# Patient Record
Sex: Female | Born: 1990
Health system: Southern US, Community
[De-identification: ages and names within clinical notes are randomized; demographics above are authoritative.]

## PROBLEM LIST (undated history)

## (undated) DIAGNOSIS — F319 Bipolar disorder, unspecified: Secondary | ICD-10-CM

## (undated) DIAGNOSIS — D497 Neoplasm of unspecified behavior of endocrine glands and other parts of nervous system: Secondary | ICD-10-CM

## (undated) DIAGNOSIS — E063 Autoimmune thyroiditis: Secondary | ICD-10-CM

## (undated) DIAGNOSIS — E221 Hyperprolactinemia: Secondary | ICD-10-CM

## (undated) DIAGNOSIS — N911 Secondary amenorrhea: Secondary | ICD-10-CM

## (undated) DIAGNOSIS — L680 Hirsutism: Secondary | ICD-10-CM

## (undated) DIAGNOSIS — E039 Hypothyroidism, unspecified: Secondary | ICD-10-CM

## (undated) DIAGNOSIS — N643 Galactorrhea not associated with childbirth: Secondary | ICD-10-CM

## (undated) HISTORY — DX: Hirsutism: L68.0

## (undated) HISTORY — DX: Neoplasm of unspecified behavior of endocrine glands and other parts of nervous system: D49.7

## (undated) HISTORY — DX: Hypothyroidism, unspecified: E03.9

## (undated) HISTORY — DX: Bipolar disorder, unspecified: F31.9

## (undated) HISTORY — DX: Secondary amenorrhea: N91.1

## (undated) HISTORY — DX: Autoimmune thyroiditis: E06.3

## (undated) HISTORY — DX: Hyperprolactinemia: E22.1

## (undated) HISTORY — PX: OTHER SURGICAL HISTORY: SHX169

---
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
## 2012-03-11 ENCOUNTER — Other Ambulatory Visit: Payer: Self-pay | Admitting: *Deleted

## 2012-03-11 DIAGNOSIS — E039 Hypothyroidism, unspecified: Secondary | ICD-10-CM

## 2012-04-20 ENCOUNTER — Ambulatory Visit: Payer: 59 | Admitting: "Endocrinology

## 2012-08-10 LAB — LUTEINIZING HORMONE

## 2012-08-10 LAB — T4, FREE

## 2012-08-10 LAB — TESTOSTERONE, FREE, TOTAL, SHBG

## 2012-08-10 LAB — ESTRADIOL

## 2012-08-10 LAB — T3, FREE

## 2012-12-15 ENCOUNTER — Other Ambulatory Visit: Payer: Self-pay | Admitting: "Endocrinology

## 2013-04-25 ENCOUNTER — Encounter (HOSPITAL_COMMUNITY): Payer: Self-pay | Admitting: *Deleted

## 2013-04-25 ENCOUNTER — Emergency Department (HOSPITAL_COMMUNITY)
Admission: EM | Admit: 2013-04-25 | Discharge: 2013-04-25 | Disposition: A | Discharge status: Home / Self Care | Payer: BC Managed Care – PPO | Attending: Emergency Medicine | Admitting: Emergency Medicine

## 2013-04-25 DIAGNOSIS — F319 Bipolar disorder, unspecified: Secondary | ICD-10-CM | POA: Insufficient documentation

## 2013-04-25 DIAGNOSIS — M549 Dorsalgia, unspecified: Secondary | ICD-10-CM | POA: Insufficient documentation

## 2013-04-25 DIAGNOSIS — E038 Other specified hypothyroidism: Secondary | ICD-10-CM | POA: Insufficient documentation

## 2013-04-25 DIAGNOSIS — R300 Dysuria: Secondary | ICD-10-CM | POA: Insufficient documentation

## 2013-04-25 DIAGNOSIS — Z79899 Other long term (current) drug therapy: Secondary | ICD-10-CM | POA: Insufficient documentation

## 2013-04-25 DIAGNOSIS — N898 Other specified noninflammatory disorders of vagina: Secondary | ICD-10-CM | POA: Insufficient documentation

## 2013-04-25 DIAGNOSIS — R110 Nausea: Secondary | ICD-10-CM | POA: Insufficient documentation

## 2013-04-25 DIAGNOSIS — R42 Dizziness and giddiness: Secondary | ICD-10-CM | POA: Insufficient documentation

## 2013-04-25 DIAGNOSIS — Z872 Personal history of diseases of the skin and subcutaneous tissue: Secondary | ICD-10-CM | POA: Insufficient documentation

## 2013-04-25 DIAGNOSIS — R109 Unspecified abdominal pain: Secondary | ICD-10-CM | POA: Insufficient documentation

## 2013-04-25 DIAGNOSIS — Z3202 Encounter for pregnancy test, result negative: Secondary | ICD-10-CM | POA: Insufficient documentation

## 2013-04-25 DIAGNOSIS — N12 Tubulo-interstitial nephritis, not specified as acute or chronic: Secondary | ICD-10-CM | POA: Insufficient documentation

## 2013-04-25 DIAGNOSIS — E063 Autoimmune thyroiditis: Secondary | ICD-10-CM | POA: Insufficient documentation

## 2013-04-25 DIAGNOSIS — Z880 Allergy status to penicillin: Secondary | ICD-10-CM | POA: Insufficient documentation

## 2013-04-25 DIAGNOSIS — R55 Syncope and collapse: Secondary | ICD-10-CM | POA: Insufficient documentation

## 2013-04-25 DIAGNOSIS — E229 Hyperfunction of pituitary gland, unspecified: Secondary | ICD-10-CM | POA: Insufficient documentation

## 2013-04-25 LAB — URINALYSIS, ROUTINE W REFLEX MICROSCOPIC
Nitrite: POSITIVE — AB
Specific Gravity, Urine: 1.027 (ref 1.005–1.030)
Urobilinogen, UA: 1 mg/dL (ref 0.0–1.0)

## 2013-04-25 LAB — WET PREP, GENITAL: Yeast Wet Prep HPF POC: NONE SEEN

## 2013-04-25 LAB — URINE MICROSCOPIC-ADD ON

## 2013-04-25 LAB — CBC WITH DIFFERENTIAL/PLATELET
Eosinophils Absolute: 0.1 10*3/uL (ref 0.0–0.7)
Eosinophils Relative: 1 % (ref 0–5)
MCH: 20.6 pg — ABNORMAL LOW (ref 26.0–34.0)
MCHC: 30.3 g/dL (ref 30.0–36.0)
Monocytes Absolute: 1.1 10*3/uL — ABNORMAL HIGH (ref 0.1–1.0)
Neutrophils Relative %: 75 % (ref 43–77)
Platelets: 255 10*3/uL (ref 150–400)
RBC: 5.19 MIL/uL — ABNORMAL HIGH (ref 3.87–5.11)

## 2013-04-25 LAB — BASIC METABOLIC PANEL
BUN: 8 mg/dL (ref 6–23)
Chloride: 102 mEq/L (ref 96–112)
Glucose, Bld: 81 mg/dL (ref 70–99)
Potassium: 4.7 mEq/L (ref 3.5–5.1)

## 2013-04-25 LAB — POCT PREGNANCY, URINE: Preg Test, Ur: NEGATIVE

## 2013-04-25 MED ORDER — CEPHALEXIN 500 MG PO CAPS: 500.0000 mg | ORAL_CAPSULE | Freq: Two times a day (BID) | ORAL | Status: DC

## 2013-04-25 MED ORDER — DEXTROSE 5 % IV SOLN
1.0000 g | Freq: Once | INTRAVENOUS | Status: AC
  Administered 2013-04-25: 1 g via INTRAVENOUS
  Filled 2013-04-25: qty 10

## 2013-04-25 MED ORDER — SODIUM CHLORIDE 0.9 % IV BOLUS (SEPSIS)
1000.0000 mL | Freq: Once | INTRAVENOUS | Status: AC
  Administered 2013-04-25: 1000 mL via INTRAVENOUS

## 2013-04-25 MED ORDER — ONDANSETRON 8 MG PO TBDP: 8.0000 mg | ORAL_TABLET | Freq: Three times a day (TID) | ORAL | Status: DC | PRN

## 2013-04-25 NOTE — ED Notes (Signed)
Bed:WA03<BR> Expected date:<BR> Expected time:<BR> Means of arrival:<BR> Comments:<BR>

## 2013-04-25 NOTE — ED Provider Notes (Signed)
PROCEDURE    EMERGENCY DEPARTMENT [place] CARDIAC EXAM "Study: Limited Ultrasound of the heart and pericardium"  INDICATIONS: possible pericarditis, rule out effusion Multiple views of the heart and pericardium are obtained with a multi-frequency probe.  PERFORMED MW:UXLKGM  IMAGES ARCHIVED?: Yes  FINDINGS: No pericardial effusion, Normal contractility and Tamponade physiology absent  LIMITATIONS: none  VIEWS USED: Subcostal 4 chamber, Parasternal long axis, Parasternal short axis and Apical 4 chamber   INTERPRETATION: Cardiac activity present, Pericardial effusioin absent and Normal contractility    Enid Skeens, MD 04/25/13 1920

## 2013-04-25 NOTE — ED Provider Notes (Addendum)
CSN: [id number]     Arrival date & time 04/25/13  1429 History     First MD Initiated Contact with Patient 04/25/13 1521     Chief Complaint  Patient presents with  . urinating blood    . Near Syncope   (Consider location/radiation/quality/duration/timing/severity/associated sxs/prior Treatment) HPI Comments: 22 y/o female with no significant medical hx, OB hx, Gyne hx, comes in with cc of vaginal bleeding and syncope. Pt has been having some dysuria x few days, and now having some back pain, lower abd pain and nausea. Pt has no fevers. Pt started having hematuria 2 days ago. Pt states that she passed out while waiting for her train. She had some nausea, dizziness prior to passing out, but no other prodromal sx. Has passed out once before when she was 18. No cardiac hx for patient, or her family, no premature CAD in the family. Denies drug use.  The history is provided by the patient.    Past Medical History  Diagnosis Date  . Hypothyroidism   . Hypothyroidism, acquired, autoimmune   . Thyroiditis, autoimmune   . Galactorrhea   . Hyperprolactinemia   . Pituitary tumor   . Amenorrhea, secondary   . Hirsutism   . Bipolar disorder    Past Surgical History  Procedure Laterality Date  . None     Family History  Problem Relation Age of Onset  . Diabetes Paternal Grandfather   . Obesity Mother   . Hypertension Father   . Anemia Father   . Obesity Paternal Aunt   . Cancer Maternal Grandfather   . Diabetes Maternal Grandfather   . Thyroid disease Neg Hx   . Kidney disease Neg Hx   . Bipolar disorder Neg Hx    History  Substance Use Topics  . Smoking status: Passive Smoke Exposure - Never Smoker  . Smokeless tobacco: Never Used  . Alcohol Use: No   OB History   Grav Para Term Preterm Abortions TAB SAB Ect Mult Living                 Review of Systems  Constitutional: Negative for activity change.  HENT: Negative for facial swelling and neck pain.   Respiratory:  Negative for cough, shortness of breath and wheezing.   Cardiovascular: Negative for chest pain.  Gastrointestinal: Positive for nausea and abdominal pain. Negative for vomiting, diarrhea, constipation, blood in stool and abdominal distention.  Genitourinary: Positive for hematuria, flank pain, vaginal bleeding and vaginal discharge. Negative for difficulty urinating.  Skin: Negative for color change.  Neurological: Positive for syncope. Negative for speech difficulty.  Hematological: Does not bruise/bleed easily.  Psychiatric/Behavioral: Negative for confusion.    Allergies  Penicillins  Home Medications   Current Outpatient Rx  Name  Route  Sig  Dispense  Refill  . desogestrel-ethinyl estradiol (APRI,EMOQUETTE,SOLIA) 0.15-30 MG-MCG tablet   Oral   Take 1 tablet by mouth daily.         Marland Kitchen levothyroxine (SYNTHROID, LEVOTHROID) 50 MCG tablet   Oral   Take 50 mcg by mouth daily before breakfast. Brand Name Only         . lithium carbonate 300 MG capsule   Oral   Take 900 mg by mouth at bedtime.         Marland Kitchen lurasidone (LATUDA) 80 MG TABS tablet   Oral   Take 40 mg by mouth daily with breakfast.         . traZODone (DESYREL) 100 MG tablet  Oral   Take 100 mg by mouth at bedtime.          BP 106/70  Pulse 99  Temp(Src) 98.9 F (37.2 C) (Oral)  Resp 16  SpO2 99% Physical Exam  Nursing note and vitals reviewed. Constitutional: She is oriented to person, place, and time. She appears well-developed and well-nourished.  HENT:  Head: Normocephalic and atraumatic.  Eyes: Conjunctivae and EOM are normal. Pupils are equal, round, and reactive to light.  Neck: Normal range of motion. Neck supple.  Cardiovascular: Normal rate, regular rhythm, normal heart sounds and intact distal pulses.   No murmur heard. Pulmonary/Chest: Effort normal. No respiratory distress. She has no wheezes.  Abdominal: Soft. Bowel sounds are normal. She exhibits no distension. There is no  tenderness. There is no rebound and no guarding.  Genitourinary: Vagina normal and uterus normal.  External exam - normal, no lesions Speculum exam: Pt has some white discharge, foul smelling, no blood Bimanual exam: Patient has no CMT, no adnexal tenderness or fullness and cervical os is closed  Neurological: She is alert and oriented to person, place, and time.  Skin: Skin is warm and dry.    ED Course   Procedures (including critical care time)  Labs Reviewed  URINALYSIS, ROUTINE W REFLEX MICROSCOPIC - Abnormal; Notable for the following:    Color, Urine ORANGE (*)    APPearance TURBID (*)    Hgb urine dipstick SMALL (*)    Bilirubin Urine SMALL (*)    Ketones, ur 15 (*)    Protein, ur 100 (*)    Nitrite POSITIVE (*)    Leukocytes, UA LARGE (*)    All other components within normal limits  URINE MICROSCOPIC-ADD ON - Abnormal; Notable for the following:    Squamous Epithelial / LPF FEW (*)    Bacteria, UA MANY (*)    All other components within normal limits  URINE CULTURE  WET PREP, GENITAL  GC/CHLAMYDIA PROBE AMP  CBC WITH DIFFERENTIAL  BASIC METABOLIC PANEL  LITHIUM LEVEL  POCT PREGNANCY, URINE   No results found. No diagnosis found.  MDM  DDx includes:  Orthostatic hypotension Dysrhythmia  PE Vasovagal/neurocardiogenic syncope Anemia GC/Chlamydia Trichomonas PID UTI/cystitis STD exposure to HIV, Hep C, Syphilis DUB  Pt comes in w/ cc of vaginal bleeding and hematuria, Has UTI like sx, and pregnancy test is neg. Pt has abd pain, flank pain, nausea - so suspect pyelo/cystitis.  Syncope appears to be neurocardiogenic. Will check h/h. Screening EKG.   Derwood Kaplan, MD 04/25/13 1605   Date: 04/25/2013  Rate: 90  Rhythm: normal sinus rhythm  QRS Axis: normal  Intervals: normal  ST/T Wave abnormalities: nonspecific ST/T changes  Conduction Disutrbances:none  Narrative Interpretation:   Old EKG Reviewed: none available    Derwood Kaplan,  MD 04/25/13 1655  6:59 PM Bedside US by Dr. Jodi Mourning - no pericardial effusions, all chambers look nml, strong EF.  Derwood Kaplan, MD 04/25/13 1859

## 2013-04-25 NOTE — ED Notes (Addendum)
Per ems pt starting urinating blood x2 days, painful urination. Pt denies being on menstrual cycle.  Vomited several times today, no blood in vomit or stool. Reports "crampy back pain" 7/10.  Also reports near syncope, pt reports everything went white and she slid down to the floor. Reports her parents were yelling at her.

## 2013-04-25 NOTE — ED Notes (Signed)
MD at bedside. 

## 2013-04-27 LAB — URINE CULTURE: Colony Count: >=100000

## 2013-04-28 NOTE — ED Notes (Signed)
+   Urine Culture Patient treated per protocol MD.  

## 2018-05-06 ENCOUNTER — Other Ambulatory Visit: Payer: Self-pay

## 2018-05-06 ENCOUNTER — Ambulatory Visit (HOSPITAL_COMMUNITY)
Admission: EM | Admit: 2018-05-06 | Discharge: 2018-05-06 | Disposition: A | Discharge status: Home / Self Care | Payer: Self-pay | Attending: Family Medicine | Admitting: Family Medicine

## 2018-05-06 DIAGNOSIS — N898 Other specified noninflammatory disorders of vagina: Secondary | ICD-10-CM

## 2018-05-06 DIAGNOSIS — F319 Bipolar disorder, unspecified: Secondary | ICD-10-CM | POA: Insufficient documentation

## 2018-05-06 DIAGNOSIS — E221 Hyperprolactinemia: Secondary | ICD-10-CM | POA: Insufficient documentation

## 2018-05-06 DIAGNOSIS — Z79899 Other long term (current) drug therapy: Secondary | ICD-10-CM | POA: Insufficient documentation

## 2018-05-06 DIAGNOSIS — Z7722 Contact with and (suspected) exposure to environmental tobacco smoke (acute) (chronic): Secondary | ICD-10-CM | POA: Insufficient documentation

## 2018-05-06 DIAGNOSIS — E039 Hypothyroidism, unspecified: Secondary | ICD-10-CM | POA: Insufficient documentation

## 2018-05-06 DIAGNOSIS — Z8619 Personal history of other infectious and parasitic diseases: Secondary | ICD-10-CM | POA: Insufficient documentation

## 2018-05-06 DIAGNOSIS — N760 Acute vaginitis: Secondary | ICD-10-CM | POA: Insufficient documentation

## 2018-05-06 DIAGNOSIS — Z880 Allergy status to penicillin: Secondary | ICD-10-CM | POA: Insufficient documentation

## 2018-05-06 MED ORDER — CEFTRIAXONE SODIUM 250 MG IJ SOLR
INTRAMUSCULAR | Status: AC
  Filled 2018-05-06: qty 250

## 2018-05-06 MED ORDER — FLUCONAZOLE 150 MG PO TABS: ORAL_TABLET | ORAL | 1 refills | Status: DC

## 2018-05-06 MED ORDER — CEFTRIAXONE SODIUM 250 MG IJ SOLR
250.0000 mg | Freq: Once | INTRAMUSCULAR | Status: AC
  Administered 2018-05-06: 250 mg via INTRAMUSCULAR

## 2018-05-06 MED ORDER — AZITHROMYCIN 250 MG PO TABS
ORAL_TABLET | ORAL | Status: AC
  Filled 2018-05-06: qty 4

## 2018-05-06 MED ORDER — AZITHROMYCIN 250 MG PO TABS
1000.0000 mg | ORAL_TABLET | Freq: Once | ORAL | Status: AC
  Administered 2018-05-06: 1000 mg via ORAL

## 2018-05-06 NOTE — Discharge Instructions (Signed)
You have been given the following medications today for treatment of possible gonorrhea and/or chlamydia:  cefTRIAXone (ROCEPHIN) injection 250 mg azithromycin (ZITHROMAX) tablet 1,000 mg  Even though we have treated you today, we have sent testing for sexually transmitted infections. We will notify you of any positive results once they are received. If required, we will prescribe any medications you might need.  Please refrain from all sexual activity for at least the next seven days.

## 2018-05-06 NOTE — ED Provider Notes (Signed)
[facility]    CSN: [id number] Arrival date & time: 05/06/18  1406     History   Chief Complaint Chief Complaint  Patient presents with  . Vaginitis    HPI [name] is a 27 y.o. female.  Vaginitis: Patient complains of an abnormal vaginal discharge for 3 weeks following leaving her tampon in for too long. Following, the patient states she used a douche, which improved her symptoms for a few hours. A week later she self-treated with Monostat, which improved her symptoms for 2 days, but provoked menstrual spotting. The spotting has continued intermittently since taking Monostat. Vaginal symptoms include abnormal bleeding: flow is light and with minimal cramping, discharge described as creamy, malodorous, green and yellow, odor, pain and post coital bleeding.STI Risk: Very low risk of STD exposure. Discharge described as: copious, yellow, green, thick and malodorous.Other associated symptoms: none. Menstrual pattern: She had been bleeding regularly until spotting started following Monostat treatment. Contraception: condoms  HPI  Past Medical History:  Diagnosis Date  . Amenorrhea, secondary   . Bipolar disorder   . Galactorrhea   . Hirsutism   . Hyperprolactinemia   . Hypothyroidism   . Hypothyroidism, acquired, autoimmune   . Pituitary tumor   . Thyroiditis, autoimmune     Patient Active Problem List   Diagnosis Date Noted  . Hypothyroidism, acquired, autoimmune   . Thyroiditis, autoimmune   . Galactorrhea   . Hyperprolactinemia ([place])   . Pituitary tumor   . Amenorrhea, secondary   . Hirsutism   . Bipolar disorder ([place])   . Other specified acquired hypothyroidism 01/31/2011  . 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 None      OB History   None      Home Medications    Prior to Admission medications   Medication Sig Start Date End Date  Taking? Authorizing Provider  cephALEXin (KEFLEX) 500 MG capsule Take 1 capsule (500 mg total) by mouth 2 (two) times daily. 04/25/13   Varney Biles, MD  desogestrel-ethinyl estradiol (APRI,EMOQUETTE,SOLIA) 0.15-30 MG-MCG tablet Take 1 tablet by mouth daily.    [provider]  levothyroxine (SYNTHROID, LEVOTHROID) 50 MCG tablet Take 50 mcg by mouth daily before breakfast. Brand Name Only    [provider]  lithium carbonate 300 MG capsule Take 900 mg by mouth at bedtime.    [provider]  lurasidone (LATUDA) 80 MG TABS tablet Take 40 mg by mouth daily with breakfast.    [provider]  ondansetron (ZOFRAN ODT) 8 MG disintegrating tablet Take 1 tablet (8 mg total) by mouth every 8 (eight) hours as needed for nausea. 04/25/13   Varney Biles, MD  traZODone (DESYREL) 100 MG tablet Take 100 mg by mouth at bedtime.    [provider]    Family History Family History  Problem Relation Age of Onset  . Diabetes Paternal Grandfather   . Obesity Mother   . Hypertension Father   . Anemia Father   . Obesity Paternal Aunt   . Cancer Maternal Grandfather   . Diabetes Maternal Grandfather   . Thyroid disease Neg Hx   . Kidney disease Neg Hx   . Bipolar disorder Neg Hx     Social History Social History   Tobacco Use  . Smoking status: Passive Smoke Exposure - Never Smoker  . Smokeless tobacco: Never Used  Substance Use Topics  .  Alcohol use: No  . Drug use: No     Allergies   Penicillins   Review of Systems Review of Systems  Constitutional: Negative for diaphoresis and fever.  Respiratory: Negative.   Cardiovascular: Negative.   Genitourinary: Positive for menstrual problem, vaginal bleeding, vaginal discharge and vaginal pain. Negative for difficulty urinating, dysuria, flank pain, pelvic pain and urgency.       Discharge described as copious yellow/green, malodorous. Intermittent spotting.   Skin: Negative for color change and  rash.     Physical Exam Triage Vital Signs ED Triage Vitals  Enc Vitals Group     BP --      Pulse --      Resp --      Temp --      Temp src --      SpO2 --      Weight 05/06/18 1428 124 lb (56.2 kg)     Height --      Head Circumference --      Peak Flow --      Pain Score 05/06/18 1427 7     Pain Loc --      Pain Edu? --      Excl. in [place]? --    No data found.  Updated Vital Signs Wt 124 lb (56.2 kg)   BMI 22.39 kg/m   Visual Acuity Right Eye Distance:   Left Eye Distance:   Bilateral Distance:    Right Eye Near:   Left Eye Near:    Bilateral Near:     Physical Exam  Constitutional: She appears well-developed and well-nourished.  Cardiovascular: Normal rate, regular rhythm, normal heart sounds and intact distal pulses.  Pulmonary/Chest: Effort normal and breath sounds normal.  Abdominal: Soft. Bowel sounds are normal. There is no tenderness. There is no guarding.  Skin: Skin is warm and dry.  Nursing note and vitals reviewed.    UC Treatments / Results  Labs (all labs ordered are listed, but only abnormal results are displayed) Labs Reviewed - No data to display  EKG None  Radiology No results found.  Procedures Procedures (including critical care time)  Medications Ordered in UC Medications - No data to display  Initial Impression / Assessment and Plan / UC Course  I have reviewed the triage vital signs and the nursing notes.  Pertinent labs & imaging results that were available during my care of the patient were reviewed by me and considered in my medical decision making (see chart for details).  Patient likely presents with Gonorrhea/Chlamydia vs. Vaginitis. Vaginal swab completed per patient to test for STIs vs. vaginitis. Patient empirically treated for Gonorrhea/Chlamydia with Azithromycin 1000 mg PO and Ceftriaxone 250 mg injection. Also treated for Vaginitis with Fluconazole 150 mg PO x1 with one refill to take second pill in 3 days if  symptoms not improved. Patient education provided on antibiotics, STI prevention, and remaining abstinent for seven days following today's treatment. Patient encouraged for FU if symptoms do not improve in a week following both doses of Fluconazole. Patient encouraged to establish care with a PCP.    Final Clinical Impressions(s) / UC Diagnoses   Final diagnoses:  None   Discharge Instructions   None    ED Prescriptions    None     Controlled Substance Prescriptions [place] Controlled Substance Registry consulted? Not Applicable   Evalee Jefferson, RN 05/06/18 [phone]

## 2018-05-06 NOTE — ED Triage Notes (Signed)
Pt states she thinks she has a yeast infection. 3 weeks

## 2018-05-07 LAB — CERVICOVAGINAL ANCILLARY ONLY
Bacterial vaginitis: POSITIVE — AB
CHLAMYDIA, DNA PROBE: NEGATIVE
Candida vaginitis: NEGATIVE
Neisseria Gonorrhea: NEGATIVE
Trichomonas: NEGATIVE

## 2018-05-08 ENCOUNTER — Telehealth (HOSPITAL_COMMUNITY): Payer: Self-pay

## 2018-05-08 MED ORDER — METRONIDAZOLE 500 MG PO TABS: 500.0000 mg | ORAL_TABLET | Freq: Two times a day (BID) | ORAL | 0 refills | Status: DC

## 2018-05-08 NOTE — Telephone Encounter (Signed)
Bacterial vaginosis is positive. This was not treated at the urgent care visit.  Patient complains of persistent symptoms.  Flagyl 500 mg BID x 7 days #14 no refills sent to patients pharmacy of choice per Dr. Valere Dross.  Pt called and made aware of results and new prescription. Answered all questions and pt verbalized understanding.

## 2018-09-18 ENCOUNTER — Other Ambulatory Visit: Payer: Self-pay

## 2018-09-18 ENCOUNTER — Encounter (HOSPITAL_BASED_OUTPATIENT_CLINIC_OR_DEPARTMENT_OTHER): Payer: Self-pay

## 2018-09-18 ENCOUNTER — Emergency Department (HOSPITAL_BASED_OUTPATIENT_CLINIC_OR_DEPARTMENT_OTHER): Payer: BLUE CROSS/BLUE SHIELD

## 2018-09-18 ENCOUNTER — Emergency Department (HOSPITAL_BASED_OUTPATIENT_CLINIC_OR_DEPARTMENT_OTHER)
Admission: EM | Admit: 2018-09-18 | Discharge: 2018-09-18 | Disposition: A | Discharge status: Home / Self Care | Payer: BLUE CROSS/BLUE SHIELD | Attending: Emergency Medicine | Admitting: Emergency Medicine

## 2018-09-18 DIAGNOSIS — R109 Unspecified abdominal pain: Secondary | ICD-10-CM | POA: Diagnosis present

## 2018-09-18 DIAGNOSIS — E039 Hypothyroidism, unspecified: Secondary | ICD-10-CM | POA: Insufficient documentation

## 2018-09-18 DIAGNOSIS — Z7722 Contact with and (suspected) exposure to environmental tobacco smoke (acute) (chronic): Secondary | ICD-10-CM | POA: Insufficient documentation

## 2018-09-18 DIAGNOSIS — Z79899 Other long term (current) drug therapy: Secondary | ICD-10-CM | POA: Diagnosis not present

## 2018-09-18 DIAGNOSIS — N390 Urinary tract infection, site not specified: Secondary | ICD-10-CM | POA: Insufficient documentation

## 2018-09-18 LAB — CBC WITH DIFFERENTIAL/PLATELET
Abs Immature Granulocytes: 0.04 10*3/uL (ref 0.00–0.07)
Basophils Absolute: 0 10*3/uL (ref 0.0–0.1)
Basophils Relative: 0 %
Eosinophils Absolute: 0 10*3/uL (ref 0.0–0.5)
Eosinophils Relative: 0 %
HCT: 39.2 % (ref 36.0–46.0)
HEMOGLOBIN: 12.1 g/dL (ref 12.0–15.0)
Immature Granulocytes: 0 %
Lymphocytes Relative: 20 %
Lymphs Abs: 2.7 10*3/uL (ref 0.7–4.0)
MCH: 27.7 pg (ref 26.0–34.0)
MCHC: 30.9 g/dL (ref 30.0–36.0)
MCV: 89.7 fL (ref 80.0–100.0)
Monocytes Absolute: 1 10*3/uL (ref 0.1–1.0)
Monocytes Relative: 8 %
NEUTROS PCT: 72 %
Neutro Abs: 9.6 10*3/uL — ABNORMAL HIGH (ref 1.7–7.7)
Platelets: 283 10*3/uL (ref 150–400)
RBC: 4.37 MIL/uL (ref 3.87–5.11)
RDW: 12.6 % (ref 11.5–15.5)
WBC: 13.4 10*3/uL — ABNORMAL HIGH (ref 4.0–10.5)
nRBC: 0 % (ref 0.0–0.2)

## 2018-09-18 LAB — COMPREHENSIVE METABOLIC PANEL
ALT: 11 U/L (ref 0–44)
AST: 17 U/L (ref 15–41)
Albumin: 4.5 g/dL (ref 3.5–5.0)
Alkaline Phosphatase: 46 U/L (ref 38–126)
Anion gap: 9 (ref 5–15)
BUN: <5 mg/dL — ABNORMAL LOW (ref 6–20)
CO2: 23 mmol/L (ref 22–32)
Calcium: 9.2 mg/dL (ref 8.9–10.3)
Chloride: 104 mmol/L (ref 98–111)
Creatinine, Ser: 0.61 mg/dL (ref 0.44–1.00)
GFR calc Af Amer: >60 mL/min (ref >60)
GFR calc non Af Amer: >60 mL/min (ref >60)
GLUCOSE: 89 mg/dL (ref 70–99)
Potassium: 3.4 mmol/L — ABNORMAL LOW (ref 3.5–5.1)
SODIUM: 136 mmol/L (ref 135–145)
Total Bilirubin: 0.9 mg/dL (ref 0.3–1.2)
Total Protein: 7.3 g/dL (ref 6.5–8.1)

## 2018-09-18 LAB — URINALYSIS, ROUTINE W REFLEX MICROSCOPIC
Bilirubin Urine: NEGATIVE
Glucose, UA: NEGATIVE mg/dL
Ketones, ur: 40 mg/dL — AB
Nitrite: POSITIVE — AB
PROTEIN: 30 mg/dL — AB
Specific Gravity, Urine: 1.01 (ref 1.005–1.030)
pH: 6 (ref 5.0–8.0)

## 2018-09-18 LAB — URINALYSIS, MICROSCOPIC (REFLEX)

## 2018-09-18 LAB — PREGNANCY, URINE: PREG TEST UR: NEGATIVE

## 2018-09-18 MED ORDER — MORPHINE SULFATE (PF) 4 MG/ML IV SOLN
4.0000 mg | Freq: Once | INTRAVENOUS | Status: AC
  Administered 2018-09-18: 4 mg via INTRAVENOUS
  Filled 2018-09-18: qty 1

## 2018-09-18 MED ORDER — CEPHALEXIN 500 MG PO CAPS: 500.0000 mg | ORAL_CAPSULE | Freq: Three times a day (TID) | ORAL | 0 refills | Status: AC

## 2018-09-18 MED ORDER — SODIUM CHLORIDE 0.9 % IV BOLUS
1000.0000 mL | Freq: Once | INTRAVENOUS | Status: AC
  Administered 2018-09-18: 1000 mL via INTRAVENOUS

## 2018-09-18 MED ORDER — CEPHALEXIN 500 MG PO CAPS: 500.0000 mg | ORAL_CAPSULE | Freq: Two times a day (BID) | ORAL | 0 refills | Status: DC

## 2018-09-18 NOTE — Discharge Instructions (Signed)
I have prescribe medication to treat your urinary tract infection. Please take 1 tablet three times a day for the next 10 days. Please return to the ED if you experience any fever, chills or worsening symptoms.

## 2018-09-18 NOTE — ED Provider Notes (Signed)
[facility] EMERGENCY DEPARTMENT Provider Note   CSN: [id number] Arrival date & time: 09/18/18  1837     History   Chief Complaint Chief Complaint  Patient presents with  . Flank Pain    HPI [name] is a 28 y.o. female.  28 y.o female with a Anemia, Bipolar presents to the ED with a chief complaint of right flank pain x yesterday. Patient reports feeling this a dull sensation to the right flank which has now radiated to her right groin region, the pain is now sharp. She reports the pain is worse with movement and deep inspiration. She has not tried any medication for relieve in symptoms. She denies any vaginal bleeding or discharge, fever, dysuria, abdominal pain or other complaints.      Past Medical History:  Diagnosis Date  . Amenorrhea, secondary   . Bipolar disorder ([place])   . Galactorrhea   . Hirsutism   . Hyperprolactinemia ([place])   . Hypothyroidism   . Hypothyroidism, acquired, autoimmune   . Pituitary tumor   . Thyroiditis, autoimmune     Patient Active Problem List   Diagnosis Date Noted  . Hypothyroidism, acquired, autoimmune   . Thyroiditis, autoimmune   . Galactorrhea   . Hyperprolactinemia ([place])   . Pituitary tumor   . Amenorrhea, secondary   . Hirsutism   . Bipolar disorder ([place])   . Other specified acquired hypothyroidism 01/31/2011  . 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 None       OB History   No obstetric history on file.      Home Medications    Prior to Admission medications   Medication Sig Start Date End Date Taking? Authorizing Provider  cephALEXin (KEFLEX) 500 MG capsule Take 1 capsule (500 mg total) by mouth 3 (three) times daily for 14 days. 09/18/18 10/02/18  Janeece Fitting, PA-C  fluconazole (DIFLUCAN) 150 MG tablet Take one tablet by mouth as a single dose. May repeat in 3 days if needed. 05/06/18   Vanessa Kick, MD  metroNIDAZOLE (FLAGYL) 500 MG tablet Take 1 tablet (500 mg total) by mouth 2 (two) times daily. 05/08/18   Raylene Everts, MD    Family History Family History  Problem Relation Age of Onset  . Diabetes Paternal Grandfather   . Obesity Mother   . Hypertension Father   . Anemia Father   . Obesity Paternal Aunt   . Cancer Maternal Grandfather   . Diabetes Maternal Grandfather   . Thyroid disease Neg Hx   . Kidney disease Neg Hx   . Bipolar disorder Neg Hx     Social History Social History   Tobacco Use  . Smoking status: Passive Smoke Exposure - Never Smoker  . Smokeless tobacco: Never Used  Substance Use Topics  . Alcohol use: No  . Drug use: No     Allergies   Patient has no active allergies.   Review of Systems Review of Systems  Constitutional: Negative for fever.  HENT: Negative for sore throat.   Gastrointestinal: Positive for abdominal pain. Negative for diarrhea, nausea and vomiting.  Genitourinary: Positive for flank pain. Negative for decreased urine volume, difficulty urinating, dysuria, hematuria and urgency.  Musculoskeletal: Negative for back pain.  Skin: Negative for pallor and wound.  All other systems reviewed and are negative.    Physical Exam Updated Vital Signs BP 126/88 (BP Location:  Right Arm)   Pulse 96   Temp 98.5 F (36.9 C) (Oral)   Resp 16   Ht 5\' 2"  (1.575 m)   Wt 59.4 kg   LMP 09/09/2018   SpO2 100%   BMI 23.96 kg/m   Physical Exam Vitals signs and nursing note reviewed.  Constitutional:      General: She is not in acute distress.    Appearance: She is well-developed.  HENT:     Head: Normocephalic and atraumatic.     Mouth/Throat:     Pharynx: No oropharyngeal exudate.  Eyes:     Pupils: Pupils are equal, round, and reactive to light.  Neck:     Musculoskeletal: Normal range of motion.  Cardiovascular:     Rate and Rhythm: Regular rhythm. Tachycardia present.     Heart sounds: Normal heart sounds.    Pulmonary:     Effort: Pulmonary effort is normal. No respiratory distress.     Breath sounds: Normal breath sounds.  Abdominal:     General: Bowel sounds are normal. There is no distension.     Palpations: Abdomen is soft.     Tenderness: There is no abdominal tenderness. There is right CVA tenderness. There is no left CVA tenderness. Negative signs include Murphy's sign and McBurney's sign.     Comments: Significant Right CVA tenderness.   Musculoskeletal:        General: No tenderness or deformity.     Right lower leg: No edema.     Left lower leg: No edema.  Skin:    General: Skin is warm and dry.  Neurological:     Mental Status: She is alert and oriented to person, place, and time.      ED Treatments / Results  Labs (all labs ordered are listed, but only abnormal results are displayed) Labs Reviewed  URINALYSIS, ROUTINE W REFLEX MICROSCOPIC - Abnormal; Notable for the following components:      Result Value   APPearance CLOUDY (*)    Hgb urine dipstick MODERATE (*)    Ketones, ur 40 (*)    Protein, ur 30 (*)    Nitrite POSITIVE (*)    Leukocytes, UA LARGE (*)    All other components within normal limits  URINALYSIS, MICROSCOPIC (REFLEX) - Abnormal; Notable for the following components:   Bacteria, UA MANY (*)    All other components within normal limits  CBC WITH DIFFERENTIAL/PLATELET - Abnormal; Notable for the following components:   WBC 13.4 (*)    Neutro Abs 9.6 (*)    All other components within normal limits  COMPREHENSIVE METABOLIC PANEL - Abnormal; Notable for the following components:   Potassium 3.4 (*)    BUN <5 (*)    All other components within normal limits  PREGNANCY, URINE    EKG None  Radiology Ct Renal Stone Study  Result Date: 09/18/2018 CLINICAL DATA:  Right flank pain for 24 hours. EXAM: CT ABDOMEN AND PELVIS WITHOUT CONTRAST TECHNIQUE: Multidetector CT imaging of the abdomen and pelvis was performed following the standard protocol  without IV contrast. COMPARISON:  Radiographs from 11/15/2004 FINDINGS: Lower chest: Unremarkable Hepatobiliary: Dependent density in the gallbladder favoring sludge, gallstones not specifically excluded although no calcifications or gas is identified in the gallbladder. Noncontrast CT appearance the liver otherwise unremarkable. Pancreas: Unremarkable Spleen: Unremarkable Adrenals/Urinary Tract: Unremarkable Stomach/Bowel: Nonvisualization of the appendix least partially due to cecal positioning adjacent to the somewhat prominent uterine fundus. No dilated bowel. Vascular/Lymphatic: Unremarkable Reproductive: Prominent uterus, about  10.5 cm in length. The right ovary is poorly seen, left ovary within normal size limits. Other: There is a small amount of free pelvic fluid in the cul-de-sac and also anterior to the uterus bilateral. Trace edema along the lower omentum particularly in the left lower quadrant. Musculoskeletal: Schmorl's node along the puts posterosuperior margin of S1. IMPRESSION: 1. There is a small amount of free pelvic fluid in the cul-de-sac and also anterior to the uterus bilateral. The right ovary is poorly seen, left ovary within normal limits. 2. Nonvisualization of the appendix, which adversely affects negative predictive value for appendicitis. 3. Prominent uterus, about 10.5 cm in length. 4. Dependent density in the gallbladder favoring sludge, gallstones not excluded. Electronically Signed   By: Van Clines M.D.   On: 09/18/2018 20:46    Procedures Procedures (including critical care time)  Medications Ordered in ED Medications  sodium chloride 0.9 % bolus 1,000 mL (1,000 mLs Intravenous New Bag/Given 09/18/18 2012)  morphine 4 MG/ML injection 4 mg (4 mg Intravenous Given 09/18/18 2014)     Initial Impression / Assessment and Plan / ED Course  I have reviewed the triage vital signs and the nursing notes.  Pertinent labs & imaging results that were available during my care  of the patient were reviewed by me and considered in my medical decision making (see chart for details).    Presents with right flank pain which began yesterday, denies any urinary symptoms at this time.  UA obtained showed moderate amount of hemoglobin, nitrite positive, large leukocytes, many bacteria.  Urine pregnancy was negative.  During examination patient has significant tenderness around the right flank some suspicion for stone disease versus pyelonephritis as patient slight elevated heart rate during visit will provide patient with fluids along with obtain a CT renal study to rule out any stone disease or hydronephrosis.  CT showed: 1. There is a small amount of free pelvic fluid in the cul-de-sac  and also anterior to the uterus bilateral. The right ovary is poorly  seen, left ovary within normal limits.  2. Nonvisualization of the appendix, which adversely affects  negative predictive value for appendicitis.  3. Prominent uterus, about 10.5 cm in length.  4. Dependent density in the gallbladder favoring sludge, gallstones  not excluded.     CT scan did not comment on kidneys, I personally spoke with radiologist Dr.Walter Janeece Fitting M.D. advised no hydronephrosis, kidneys showed no stone disease, no infection on the urinary tract.  At this time patient slightly elevated heart rate after fluids around 96, along with her back pain but no fever suspicion for [place] versus UTI we will treat her with Keflex 3 times daily for the next 10 days.  She does not have any PCP at this time will provide her the [facility] wellness clinic to follow-up.  She is advised to return if symptoms worsen.  Vitals stable for discharge patient stable for discharge. Final Clinical Impressions(s) / ED Diagnoses   Final diagnoses:  Right flank pain  Lower urinary tract infectious disease    ED Discharge Orders         Ordered    cephALEXin (KEFLEX) 500 MG capsule  2 times daily,   Status:  Discontinued      09/18/18 2125    cephALEXin (KEFLEX) 500 MG capsule  3 times daily     09/18/18 2127           Janeece Fitting, PA-C 09/18/18 2129    Gerlene Fee  M, MD 09/19/18 1164

## 2018-09-18 NOTE — ED Notes (Signed)
ED Provider at bedside. 

## 2018-09-18 NOTE — ED Notes (Signed)
Patient transported to CT 

## 2018-09-18 NOTE — ED Triage Notes (Signed)
Right side flank pain worsening since yesterday radiating around right side, denies n/v, denies hematuria, denies injury

## 2018-09-26 ENCOUNTER — Other Ambulatory Visit: Payer: Self-pay

## 2018-09-26 ENCOUNTER — Emergency Department (HOSPITAL_BASED_OUTPATIENT_CLINIC_OR_DEPARTMENT_OTHER)
Admission: EM | Admit: 2018-09-26 | Discharge: 2018-09-26 | Disposition: A | Discharge status: Home / Self Care | Payer: BLUE CROSS/BLUE SHIELD | Attending: Emergency Medicine | Admitting: Emergency Medicine

## 2018-09-26 ENCOUNTER — Encounter (HOSPITAL_BASED_OUTPATIENT_CLINIC_OR_DEPARTMENT_OTHER): Payer: Self-pay | Admitting: Emergency Medicine

## 2018-09-26 DIAGNOSIS — Z7722 Contact with and (suspected) exposure to environmental tobacco smoke (acute) (chronic): Secondary | ICD-10-CM | POA: Diagnosis not present

## 2018-09-26 DIAGNOSIS — E039 Hypothyroidism, unspecified: Secondary | ICD-10-CM | POA: Diagnosis not present

## 2018-09-26 DIAGNOSIS — R310 Gross hematuria: Secondary | ICD-10-CM | POA: Diagnosis not present

## 2018-09-26 DIAGNOSIS — R319 Hematuria, unspecified: Secondary | ICD-10-CM | POA: Diagnosis present

## 2018-09-26 LAB — URINALYSIS, ROUTINE W REFLEX MICROSCOPIC
BILIRUBIN URINE: NEGATIVE
Glucose, UA: NEGATIVE mg/dL
Ketones, ur: NEGATIVE mg/dL
Leukocytes, UA: NEGATIVE
Nitrite: NEGATIVE
PROTEIN: NEGATIVE mg/dL
Specific Gravity, Urine: 1.015 (ref 1.005–1.030)
pH: 6.5 (ref 5.0–8.0)

## 2018-09-26 LAB — URINALYSIS, MICROSCOPIC (REFLEX)

## 2018-09-26 LAB — PREGNANCY, URINE: Preg Test, Ur: NEGATIVE

## 2018-09-26 NOTE — ED Triage Notes (Signed)
Reports that she was recently diagnosed with UTI and is now urinating blood.  Reports currently taking antibiotics without fail.

## 2018-09-26 NOTE — ED Provider Notes (Signed)
[facility] EMERGENCY DEPARTMENT Provider Note   CSN: [id number] Arrival date & time: 09/26/18  1212     History   Chief Complaint Chief Complaint  Patient presents with  . Hematuria    HPI [name] is a 29 y.o. female presenting to the ED with complaint of hematuria that she noticed today. Pt was recently a weighted on 09/18/2018 diagnosed with a UTI.  Treated with 10 days of Keflex.  Patient states she has been compliant, and has a few days left of the antibiotics.  He states her initial symptoms have completely resolved, no longer has flank pain.  No other urinary symptoms.  No fevers.  The history is provided by the patient and medical records.    Past Medical History:  Diagnosis Date  . Amenorrhea, secondary   . Bipolar disorder ([place])   . Galactorrhea   . Hirsutism   . Hyperprolactinemia ([place])   . Hypothyroidism   . Hypothyroidism, acquired, autoimmune   . Pituitary tumor   . Thyroiditis, autoimmune     Patient Active Problem List   Diagnosis Date Noted  . Hypothyroidism, acquired, autoimmune   . Thyroiditis, autoimmune   . Galactorrhea   . Hyperprolactinemia ([place])   . Pituitary tumor   . Amenorrhea, secondary   . Hirsutism   . Bipolar disorder ([place])   . Other specified acquired hypothyroidism 01/31/2011  . 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 None       OB History   No obstetric history on file.      Home Medications    Prior to Admission medications   Medication Sig Start Date End Date Taking? Authorizing Provider  cephALEXin (KEFLEX) 500 MG capsule Take 1 capsule (500 mg total) by mouth 3 (three) times daily for 14 days. 09/18/18 10/02/18  Janeece Fitting, PA-C  fluconazole (DIFLUCAN) 150 MG tablet Take one tablet by mouth as a single dose. May repeat in 3 days if needed. 05/06/18   Vanessa Kick, MD  metroNIDAZOLE (FLAGYL) 500 MG  tablet Take 1 tablet (500 mg total) by mouth 2 (two) times daily. 05/08/18   Raylene Everts, MD    Family History Family History  Problem Relation Age of Onset  . Diabetes Paternal Grandfather   . Obesity Mother   . Hypertension Father   . Anemia Father   . Obesity Paternal Aunt   . Cancer Maternal Grandfather   . Diabetes Maternal Grandfather   . Thyroid disease Neg Hx   . Kidney disease Neg Hx   . Bipolar disorder Neg Hx     Social History Social History   Tobacco Use  . Smoking status: Passive Smoke Exposure - Never Smoker  . Smokeless tobacco: Never Used  Substance Use Topics  . Alcohol use: No  . Drug use: No     Allergies   Patient has no known allergies.   Review of Systems Review of Systems  Constitutional: Negative for fever.  Gastrointestinal: Negative for abdominal pain and nausea.  Genitourinary: Negative for dysuria, flank pain and frequency.     Physical Exam Updated Vital Signs BP 127/89 (BP Location: Left Arm)   Pulse 82   Temp 98.1 F (36.7 C) (Oral)   Resp 16   Ht 5\' 2"  (1.575 m)   Wt 56.7 kg   LMP 09/09/2018   SpO2 100%   BMI 22.86 kg/m  Physical Exam Vitals signs and nursing note reviewed.  Constitutional:      General: She is not in acute distress.    Appearance: She is well-developed. She is not ill-appearing.  HENT:     Head: Normocephalic and atraumatic.  Eyes:     Conjunctiva/sclera: Conjunctivae normal.  Cardiovascular:     Rate and Rhythm: Normal rate and regular rhythm.  Pulmonary:     Effort: Pulmonary effort is normal.     Breath sounds: Normal breath sounds.  Abdominal:     General: Bowel sounds are normal. There is no distension.     Tenderness: There is no abdominal tenderness. There is no guarding or rebound.  Neurological:     Mental Status: She is alert.  Psychiatric:        Mood and Affect: Mood normal.        Behavior: Behavior normal.      ED Treatments / Results  Labs (all labs ordered are  listed, but only abnormal results are displayed) Labs Reviewed  URINALYSIS, ROUTINE W REFLEX MICROSCOPIC - Abnormal; Notable for the following components:      Result Value   Hgb urine dipstick SMALL (*)    All other components within normal limits  URINALYSIS, MICROSCOPIC (REFLEX) - Abnormal; Notable for the following components:   Bacteria, UA FEW (*)    All other components within normal limits  URINE CULTURE  PREGNANCY, URINE    EKG None  Radiology No results found.  Procedures Procedures (including critical care time)  Medications Ordered in ED Medications - No data to display   Initial Impression / Assessment and Plan / ED Course  I have reviewed the triage vital signs and the nursing notes.  Pertinent labs & imaging results that were available during my care of the patient were reviewed by me and considered in my medical decision making (see chart for details).    Patient presenting with complaint of gross hematuria after recent UTI diagnosis on 09/18/2018.  Her urinary symptoms have resolved.  She currently has asymptomatic hematuria started today.  Upon comparing prior UA results from 09/18/2018 to today; there was moderate hemoglobin prior visit and today there is only a small amount of hemoglobin.  Initially nitrite positive with large leuks, 21-50 white blood cells, many bacteria.  Today, nitrite negative, negative leuks, few bacteria, 0-5 white.  UA results today are overall improved.  Patient is resolved urinary symptoms and improving UA results, provided reassurance to patient.  Encouraged she continue her antibiotic and follow-up with PCP as needed.  No interventions indicated at this time.  Safe for discharge.  Discussed results, findings, treatment and follow up. Patient advised of return precautions. Patient verbalized understanding and agreed with plan.   Final Clinical Impressions(s) / ED Diagnoses   Final diagnoses:  Gross hematuria    ED Discharge Orders     None       Robinson, [place] N, PA-C 09/26/18 1353    Sherwood Gambler, MD 09/27/18 [phone]

## 2018-09-26 NOTE — Discharge Instructions (Signed)
Your urine results are improving.  Continue the antibiotics as prescribed until gone.  Follow up with your primary care provider as needed.

## 2018-09-28 LAB — URINE CULTURE

## 2019-01-28 ENCOUNTER — Other Ambulatory Visit: Payer: Self-pay

## 2019-01-28 ENCOUNTER — Encounter (HOSPITAL_BASED_OUTPATIENT_CLINIC_OR_DEPARTMENT_OTHER): Payer: Self-pay | Admitting: Emergency Medicine

## 2019-01-28 ENCOUNTER — Emergency Department (HOSPITAL_BASED_OUTPATIENT_CLINIC_OR_DEPARTMENT_OTHER): Payer: BLUE CROSS/BLUE SHIELD

## 2019-01-28 ENCOUNTER — Emergency Department (HOSPITAL_BASED_OUTPATIENT_CLINIC_OR_DEPARTMENT_OTHER)
Admission: EM | Admit: 2019-01-28 | Discharge: 2019-01-28 | Disposition: A | Discharge status: Home / Self Care | Payer: BLUE CROSS/BLUE SHIELD | Attending: Emergency Medicine | Admitting: Emergency Medicine

## 2019-01-28 DIAGNOSIS — E876 Hypokalemia: Secondary | ICD-10-CM

## 2019-01-28 DIAGNOSIS — Z20828 Contact with and (suspected) exposure to other viral communicable diseases: Secondary | ICD-10-CM | POA: Diagnosis not present

## 2019-01-28 DIAGNOSIS — E039 Hypothyroidism, unspecified: Secondary | ICD-10-CM | POA: Insufficient documentation

## 2019-01-28 DIAGNOSIS — R509 Fever, unspecified: Secondary | ICD-10-CM | POA: Diagnosis present

## 2019-01-28 DIAGNOSIS — D353 Benign neoplasm of craniopharyngeal duct: Secondary | ICD-10-CM | POA: Diagnosis not present

## 2019-01-28 DIAGNOSIS — Z7722 Contact with and (suspected) exposure to environmental tobacco smoke (acute) (chronic): Secondary | ICD-10-CM | POA: Diagnosis not present

## 2019-01-28 DIAGNOSIS — N939 Abnormal uterine and vaginal bleeding, unspecified: Secondary | ICD-10-CM

## 2019-01-28 DIAGNOSIS — D352 Benign neoplasm of pituitary gland: Secondary | ICD-10-CM | POA: Insufficient documentation

## 2019-01-28 DIAGNOSIS — R05 Cough: Secondary | ICD-10-CM | POA: Insufficient documentation

## 2019-01-28 DIAGNOSIS — N12 Tubulo-interstitial nephritis, not specified as acute or chronic: Secondary | ICD-10-CM | POA: Insufficient documentation

## 2019-01-28 DIAGNOSIS — R1030 Lower abdominal pain, unspecified: Secondary | ICD-10-CM

## 2019-01-28 LAB — CBC WITH DIFFERENTIAL/PLATELET
Abs Immature Granulocytes: 0.05 10*3/uL (ref 0.00–0.07)
Basophils Absolute: 0 10*3/uL (ref 0.0–0.1)
Basophils Relative: 0 %
Eosinophils Absolute: 0 10*3/uL (ref 0.0–0.5)
Eosinophils Relative: 0 %
HCT: 36.5 % (ref 36.0–46.0)
Hemoglobin: 11.6 g/dL — ABNORMAL LOW (ref 12.0–15.0)
Immature Granulocytes: 0 %
Lymphocytes Relative: 11 %
Lymphs Abs: 1.5 10*3/uL (ref 0.7–4.0)
MCH: 26.7 pg (ref 26.0–34.0)
MCHC: 31.8 g/dL (ref 30.0–36.0)
MCV: 83.9 fL (ref 80.0–100.0)
Monocytes Absolute: 1.9 10*3/uL — ABNORMAL HIGH (ref 0.1–1.0)
Monocytes Relative: 14 %
Neutro Abs: 9.9 10*3/uL — ABNORMAL HIGH (ref 1.7–7.7)
Neutrophils Relative %: 75 %
Platelets: 255 10*3/uL (ref 150–400)
RBC: 4.35 MIL/uL (ref 3.87–5.11)
RDW: 13.2 % (ref 11.5–15.5)
WBC: 13.3 10*3/uL — ABNORMAL HIGH (ref 4.0–10.5)
nRBC: 0 % (ref 0.0–0.2)

## 2019-01-28 LAB — URINALYSIS, MICROSCOPIC (REFLEX)

## 2019-01-28 LAB — COMPREHENSIVE METABOLIC PANEL
ALT: 26 U/L (ref 0–44)
AST: 28 U/L (ref 15–41)
Albumin: 4.2 g/dL (ref 3.5–5.0)
Alkaline Phosphatase: 46 U/L (ref 38–126)
Anion gap: 12 (ref 5–15)
BUN: 6 mg/dL (ref 6–20)
CO2: 20 mmol/L — ABNORMAL LOW (ref 22–32)
Calcium: 9.1 mg/dL (ref 8.9–10.3)
Chloride: 102 mmol/L (ref 98–111)
Creatinine, Ser: 0.73 mg/dL (ref 0.44–1.00)
GFR calc Af Amer: >60 mL/min (ref >60)
GFR calc non Af Amer: >60 mL/min (ref >60)
Glucose, Bld: 114 mg/dL — ABNORMAL HIGH (ref 70–99)
Potassium: 3.2 mmol/L — ABNORMAL LOW (ref 3.5–5.1)
Sodium: 134 mmol/L — ABNORMAL LOW (ref 135–145)
Total Bilirubin: 1.1 mg/dL (ref 0.3–1.2)
Total Protein: 7.1 g/dL (ref 6.5–8.1)

## 2019-01-28 LAB — URINALYSIS, ROUTINE W REFLEX MICROSCOPIC
Bilirubin Urine: NEGATIVE
Glucose, UA: NEGATIVE mg/dL
Ketones, ur: 15 mg/dL — AB
Nitrite: POSITIVE — AB
Protein, ur: NEGATIVE mg/dL
Specific Gravity, Urine: 1.02 (ref 1.005–1.030)
pH: 6 (ref 5.0–8.0)

## 2019-01-28 LAB — WET PREP, GENITAL
Clue Cells Wet Prep HPF POC: NONE SEEN
Sperm: NONE SEEN
Trich, Wet Prep: NONE SEEN
Yeast Wet Prep HPF POC: NONE SEEN

## 2019-01-28 LAB — PREGNANCY, URINE: Preg Test, Ur: NEGATIVE

## 2019-01-28 LAB — LACTIC ACID, PLASMA: Lactic Acid, Venous: 1 mmol/L (ref 0.5–1.9)

## 2019-01-28 LAB — SARS CORONAVIRUS 2 AG (30 MIN TAT): SARS Coronavirus 2 Ag: NEGATIVE

## 2019-01-28 MED ORDER — SODIUM CHLORIDE 0.9 % IV BOLUS (SEPSIS)
1000.0000 mL | Freq: Once | INTRAVENOUS | Status: AC
  Administered 2019-01-28: 1000 mL via INTRAVENOUS

## 2019-01-28 MED ORDER — CEPHALEXIN 500 MG PO CAPS: 500.0000 mg | ORAL_CAPSULE | Freq: Four times a day (QID) | ORAL | 0 refills | Status: DC

## 2019-01-28 MED ORDER — SODIUM CHLORIDE 0.9 % IV SOLN
INTRAVENOUS | Status: DC | PRN
  Administered 2019-01-28: 11:00:00 via INTRAVENOUS

## 2019-01-28 MED ORDER — SODIUM CHLORIDE 0.9 % IV SOLN
1.0000 g | Freq: Once | INTRAVENOUS | Status: AC
  Administered 2019-01-28: 1 g via INTRAVENOUS
  Filled 2019-01-28: qty 10

## 2019-01-28 MED ORDER — POTASSIUM CHLORIDE CRYS ER 20 MEQ PO TBCR
20.0000 meq | EXTENDED_RELEASE_TABLET | Freq: Once | ORAL | Status: AC
  Administered 2019-01-28: 20 meq via ORAL
  Filled 2019-01-28: qty 1

## 2019-01-28 MED ORDER — ACETAMINOPHEN 325 MG PO TABS
650.0000 mg | ORAL_TABLET | Freq: Once | ORAL | Status: AC | PRN
  Administered 2019-01-28: 650 mg via ORAL
  Filled 2019-01-28: qty 2

## 2019-01-28 MED ORDER — SODIUM CHLORIDE 0.9 % IV BOLUS (SEPSIS): 500.0000 mL | Freq: Once | INTRAVENOUS | Status: DC

## 2019-01-28 MED ORDER — SODIUM CHLORIDE 0.9 % IV BOLUS (SEPSIS): 250.0000 mL | Freq: Once | INTRAVENOUS | Status: DC

## 2019-01-28 NOTE — ED Notes (Signed)
ED Provider at bedside. 

## 2019-01-28 NOTE — ED Provider Notes (Signed)
[facility] EMERGENCY DEPARTMENT Provider Note   CSN: [id number] Arrival date & time: 01/28/19  3329    History   Chief Complaint Chief Complaint  Patient presents with  . Fever, cough, vaginal bleeding    HPI [name] is a 28 y.o. female.  She is complaining of a fever up to a maximum of 103.2 that is been intermittent for 3 to 4 days.  She is tried nothing for it.  It is associated with some low abdominal cramping and some vaginal bleeding.  She said her last menstrual period was 2 weeks ago normal time.  She is concerned she might of had a miscarriage.  She is never been pregnant before.  She is sexually active and does not use any protection.  She has a cough that is not been productive that she calls a smoker's cough maybe has been a little bit worse during this fever.  No sore throat no runny nose.  She has had nausea but no vomiting.  She was constipated earlier but now is moving her bowels.  No urinary symptoms.  The abdominal pain is associated with some low back pain.     The history is provided by the patient.  Fever  Max temp prior to arrival:  103.2 Onset quality:  Gradual Timing:  Intermittent Progression:  Waxing and waning Chronicity:  New Relieved by:  None tried Worsened by:  Nothing Ineffective treatments:  None tried Associated symptoms: cough and nausea   Associated symptoms: no chest pain, no chills, no confusion, no diarrhea, no dysuria, no headaches, no rash, no rhinorrhea, no sore throat and no vomiting   Risk factors: no immunosuppression, no recent travel and no sick contacts     Past Medical History:  Diagnosis Date  . Amenorrhea, secondary   . Bipolar disorder ([place])   . Galactorrhea   . Hirsutism   . Hyperprolactinemia ([place])   . Hypothyroidism   . Hypothyroidism, acquired, autoimmune   . Pituitary tumor   . Thyroiditis, autoimmune     Patient Active Problem List   Diagnosis Date Noted  . Hypothyroidism, acquired,  autoimmune   . Thyroiditis, autoimmune   . Galactorrhea   . Hyperprolactinemia ([place])   . Pituitary tumor   . Amenorrhea, secondary   . Hirsutism   . Bipolar disorder ([place])   . Other specified acquired hypothyroidism 01/31/2011  . 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 None       OB History   No obstetric history on file.      Home Medications    Prior to Admission medications   Not on File    Family History Family History  Problem Relation Age of Onset  . Diabetes Paternal Grandfather   . Obesity Mother   . Hypertension Father   . Anemia Father   . Obesity Paternal Aunt   . Cancer Maternal Grandfather   . Diabetes Maternal Grandfather   . Thyroid disease Neg Hx   . Kidney disease Neg Hx   . Bipolar disorder Neg Hx     Social History Social History   Tobacco Use  . Smoking status: Passive Smoke Exposure - Never Smoker  . Smokeless tobacco: Never Used  Substance Use Topics  . Alcohol use: Yes    Comment: socially  . Drug use: No     Allergies   Patient has no known allergies.  Review of Systems Review of Systems  Constitutional: Positive for fever. Negative for chills.  HENT: Negative for rhinorrhea and sore throat.   Eyes: Negative for visual disturbance.  Respiratory: Positive for cough. Negative for shortness of breath.   Cardiovascular: Negative for chest pain.  Gastrointestinal: Positive for constipation and nausea. Negative for abdominal pain, diarrhea and vomiting.  Genitourinary: Positive for vaginal bleeding. Negative for dysuria, frequency and vaginal discharge.  Musculoskeletal: Positive for back pain.  Skin: Negative for rash.  Neurological: Negative for headaches.  Psychiatric/Behavioral: Negative for confusion.     Physical Exam Updated Vital Signs BP 128/78 (BP Location: Right Arm)   Pulse (!) 116   Temp (!) 103.2 F  (39.6 C) (Oral)   Resp 20   Ht 5\' 2"  (1.575 m)   Wt 52.2 kg   LMP 01/07/2019   SpO2 98%   BMI 21.03 kg/m   Physical Exam Vitals signs and nursing note reviewed. Exam conducted with a chaperone present.  Constitutional:      General: She is not in acute distress.    Appearance: Normal appearance. She is well-developed. She is not toxic-appearing.  HENT:     Head: Normocephalic and atraumatic.  Eyes:     Conjunctiva/sclera: Conjunctivae normal.  Neck:     Musculoskeletal: Neck supple.  Cardiovascular:     Rate and Rhythm: Regular rhythm. Tachycardia present.     Heart sounds: No murmur.  Pulmonary:     Effort: Pulmonary effort is normal. No respiratory distress.     Breath sounds: Normal breath sounds.  Abdominal:     Palpations: Abdomen is soft.     Tenderness: There is no abdominal tenderness. There is no guarding or rebound.  Musculoskeletal: Normal range of motion.     Right lower leg: No edema.     Left lower leg: No edema.  Skin:    General: Skin is warm and dry.     Capillary Refill: Capillary refill takes less than 2 seconds.  Neurological:     General: No focal deficit present.     Mental Status: She is alert.     Sensory: No sensory deficit.     Motor: No weakness.     Gait: Gait normal.      ED Treatments / Results  Labs (all labs ordered are listed, but only abnormal results are displayed) Labs Reviewed  WET PREP, GENITAL - Abnormal; Notable for the following components:      Result Value   WBC, Wet Prep HPF POC MODERATE (*)    All other components within normal limits  COMPREHENSIVE METABOLIC PANEL - Abnormal; Notable for the following components:   Sodium 134 (*)    Potassium 3.2 (*)    CO2 20 (*)    Glucose, Bld 114 (*)    All other components within normal limits  CBC WITH DIFFERENTIAL/PLATELET - Abnormal; Notable for the following components:   WBC 13.3 (*)    Hemoglobin 11.6 (*)    Neutro Abs 9.9 (*)    Monocytes Absolute 1.9 (*)    All  other components within normal limits  URINALYSIS, ROUTINE W REFLEX MICROSCOPIC - Abnormal; Notable for the following components:   Hgb urine dipstick LARGE (*)    Ketones, ur 15 (*)    Nitrite POSITIVE (*)    Leukocytes,Ua SMALL (*)    All other components within normal limits  URINALYSIS, MICROSCOPIC (REFLEX) - Abnormal; Notable for the following components:   Bacteria, UA MANY (*)  All other components within normal limits  SARS CORONAVIRUS 2 (HOSP ORDER, PERFORMED IN [facility] VIA [facility] ID)  CULTURE, BLOOD (ROUTINE X 2)  CULTURE, BLOOD (ROUTINE X 2)  URINE CULTURE  LACTIC ACID, PLASMA  PREGNANCY, URINE  RPR  HIV ANTIBODY (ROUTINE TESTING W REFLEX)  GC/CHLAMYDIA PROBE AMP ([facility]) NOT AT [facility]    EKG None  Radiology Dg Chest Port 1 View  Result Date: 01/28/2019 CLINICAL DATA:  Cough and fever for a few days.  Smoker. EXAM: PORTABLE CHEST 1 VIEW COMPARISON:  08/03/2004. FINDINGS: The heart size and mediastinal contours are within normal limits. Both lungs are clear. The visualized skeletal structures are unremarkable. IMPRESSION: No active disease. Electronically Signed   By: Staci Righter M.D.   On: 01/28/2019 10:25    Procedures Procedures (including critical care time)  Medications Ordered in ED Medications  sodium chloride 0.9 % bolus 1,000 mL (has no administration in time range)    And  sodium chloride 0.9 % bolus 500 mL (has no administration in time range)    And  sodium chloride 0.9 % bolus 250 mL (has no administration in time range)  acetaminophen (TYLENOL) tablet 650 mg (650 mg Oral Given 01/28/19 0949)     Initial Impression / Assessment and Plan / ED Course  I have reviewed the triage vital signs and the nursing notes.  Pertinent labs & imaging results that were available during my care of the patient were reviewed by me and considered in my medical decision making (see chart for details).  Clinical Course as of Jan 28 1312  Thu Jan 28, 2935  [id number] 28 year old female here with a high fever and low abdominal cramping pain with associated vaginal bleeding that is not associated with her regular cycle.  Differential diagnosis includes Covid, pneumonia, miscarriage, PID, Pyelo.  She is getting screening labs chest x-ray urinalysis pregnancy test and some fever control along with IV fluids.   [MB]  1117 Pelvic exam done with nurse Rod Holler as chaperone.  She had some dark blood in the vault.  No obvious discharge.  No cervical motion tenderness or adnexal tenderness or mass.  Samples were sent for GC and chlamydia and wet prep.   [MB]  2119 Patient's lab work significant for slightly elevated white count and slightly low potassium and bicarb.  Her urinalysis looks like there could be a possible UTI with 21-50 whites and nitrite positive.  Ordered ceftriaxone.  Her lactate is normal so we will back off on IV fluids.   [MB]  1118 Rapid Covid testing also negative.   [MB]  1216 Wet prep showing some WBCs.  Will treat for urine infection/[place] and explained to patient that further culture tests would result in the next day or 2.   [MB]    Clinical Course User Index [MB] Hayden Rasmussen, MD   [name] was evaluated in Emergency Department on 01/28/2019 for the symptoms described in the history of present illness. She was evaluated in the context of the global COVID-19 pandemic, which necessitated consideration that the patient might be at risk for infection with the SARS-CoV-2 virus that causes COVID-19. Institutional protocols and algorithms that pertain to the evaluation of patients at risk for COVID-19 are in a state of rapid change based on information released by regulatory bodies including the CDC and federal and state organizations. These policies and algorithms were followed during the patient's care in the ED.      Final  Clinical Impressions(s) / ED Diagnoses   Final diagnoses:  Lower abdominal pain  Vaginal bleeding   Pyelonephritis  Hypokalemia    ED Discharge Orders         Ordered    cephALEXin (KEFLEX) 500 MG capsule  4 times daily     01/28/19 1207           Hayden Rasmussen, MD 01/28/19 1313

## 2019-01-28 NOTE — ED Triage Notes (Signed)
Fever intermittently for a few days that pt does not treat. 103 last night.  Cough that pt has had for months but has gotten worse in the past couple days. Vaginal bleeding between periods that started 2 days ago.

## 2019-01-28 NOTE — ED Notes (Signed)
After multiple IV sticks was not able to obtain 2nd blood culture, so ABX were started.

## 2019-01-28 NOTE — Discharge Instructions (Signed)
You were seen in the emergency department for high fever along with low abdominal pain and vaginal bleeding.  Your pregnancy test was negative.  Your lab test suggested a urine infection so we are treating you with antibiotics for that.  We also did testing for STDs and those results will take a few days.  Will be important for you to treat the fever with Tylenol and ibuprofen and to stay well-hydrated.  Please finish your antibiotics.  Return if any worsening symptoms.

## 2019-01-29 LAB — RPR: RPR Ser Ql: NONREACTIVE

## 2019-01-29 LAB — GC/CHLAMYDIA PROBE AMP (~~LOC~~) NOT AT ARMC
Chlamydia: NEGATIVE
Neisseria Gonorrhea: NEGATIVE

## 2019-01-29 LAB — HIV ANTIBODY (ROUTINE TESTING W REFLEX): HIV Screen 4th Generation wRfx: NONREACTIVE

## 2019-01-30 LAB — URINE CULTURE: Culture: >=100000 — AB

## 2019-01-31 ENCOUNTER — Telehealth: Payer: Self-pay | Admitting: Emergency Medicine

## 2019-01-31 NOTE — Telephone Encounter (Signed)
Post ED Visit - Positive Culture Follow-up  Culture report reviewed by antimicrobial stewardship pharmacist: [name] []  [name], Pharm.D. []  [name], Pharm.D., BCPS AQ-ID []  [name], Pharm.D., BCPS []  [name], Pharm.D., BCPS []  [name], Pharm.D., BCPS, AAHIVP []  [name], Pharm.D., BCPS, AAHIVP []  [name], PharmD, BCPS []  [name], PharmD, BCPS [x]  [name], PharmD, BCPS []  [name], PharmD []  [name], PharmD, BCPS []  [name], PharmD  [name] []  [name], PharmD []  [name], PharmD []  [name], PharmD []  [name], Rph []  [name], PharmD []  [name], PharmD []  [name], PharmD []  [name], PharmD []  [name], PharmD []  [name], PharmD []  [name], PharmD []  [name], PharmD []  [name], PharmD   Positive urine culture Treated with cephalexin, organism sensitive to the same and no further patient follow-up is required at this time.  [name] 01/31/2019, 2:22 PM

## 2019-02-02 LAB — CULTURE, BLOOD (ROUTINE X 2)
Culture: NO GROWTH
Special Requests: ADEQUATE

## 2020-07-09 IMAGING — DX PORTABLE CHEST - 1 VIEW
1 series · 1 of 1 positions shown · non-contrast
Comparison: 08/03/2004.

CLINICAL DATA: Cough and fever for a few days.  Smoker.

EXAM:
PORTABLE CHEST 1 VIEW

[chest ap]
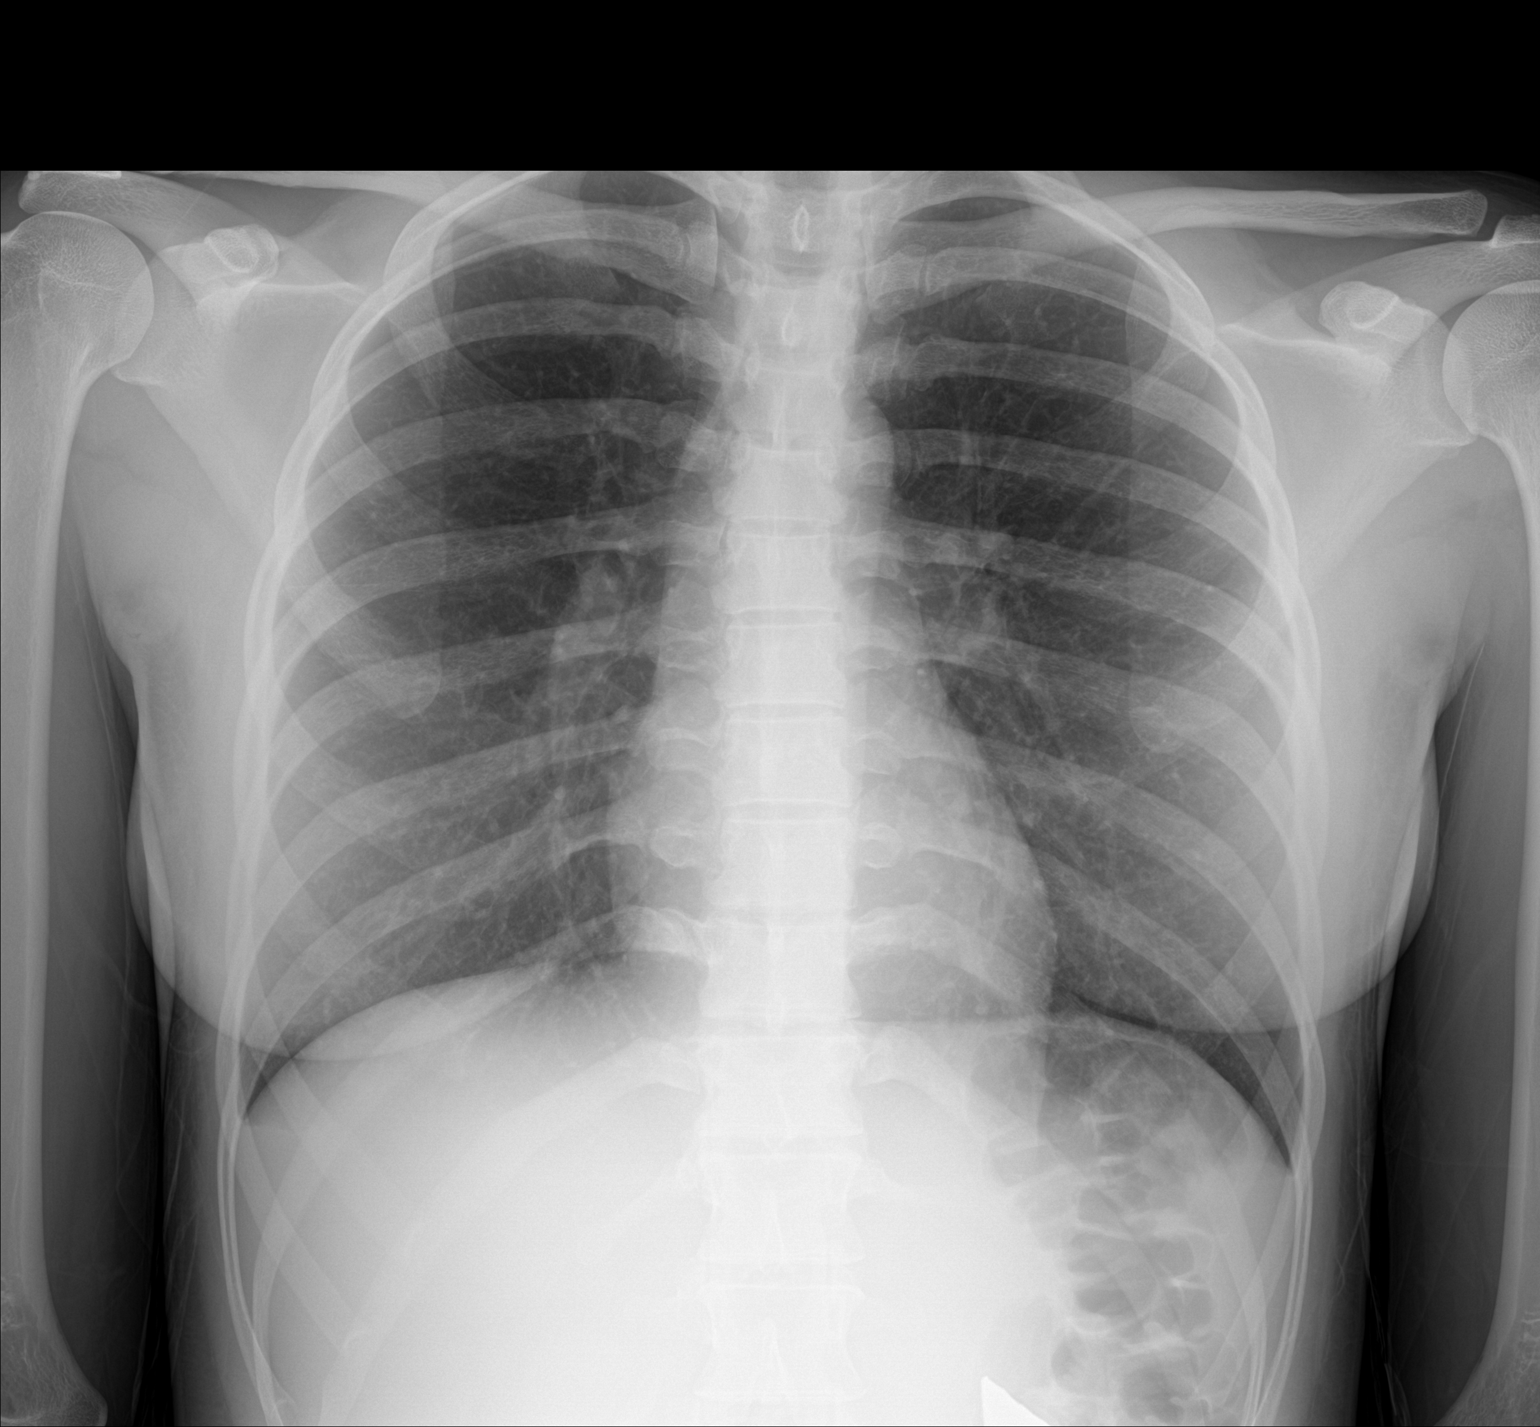

[1 of 1 positions shown; findings below may reference images not displayed]

FINDINGS: The heart size and mediastinal contours are within normal limits.
Both lungs are clear. The visualized skeletal structures are
unremarkable.
IMPRESSION: No active disease.

## 2020-08-18 ENCOUNTER — Emergency Department (HOSPITAL_BASED_OUTPATIENT_CLINIC_OR_DEPARTMENT_OTHER): Payer: Self-pay

## 2020-08-18 ENCOUNTER — Inpatient Hospital Stay (HOSPITAL_BASED_OUTPATIENT_CLINIC_OR_DEPARTMENT_OTHER)
Admission: EM | Admit: 2020-08-18 | Discharge: 2020-08-21 | DRG: 872 | Disposition: A | Discharge status: Home / Self Care | Payer: Self-pay | Attending: Internal Medicine | Admitting: Internal Medicine

## 2020-08-18 ENCOUNTER — Encounter (HOSPITAL_BASED_OUTPATIENT_CLINIC_OR_DEPARTMENT_OTHER): Payer: Self-pay

## 2020-08-18 ENCOUNTER — Other Ambulatory Visit: Payer: Self-pay

## 2020-08-18 DIAGNOSIS — N10 Acute tubulo-interstitial nephritis: Secondary | ICD-10-CM | POA: Diagnosis present

## 2020-08-18 DIAGNOSIS — R17 Unspecified jaundice: Secondary | ICD-10-CM | POA: Diagnosis present

## 2020-08-18 DIAGNOSIS — Z8249 Family history of ischemic heart disease and other diseases of the circulatory system: Secondary | ICD-10-CM

## 2020-08-18 DIAGNOSIS — D497 Neoplasm of unspecified behavior of endocrine glands and other parts of nervous system: Secondary | ICD-10-CM | POA: Diagnosis present

## 2020-08-18 DIAGNOSIS — R0600 Dyspnea, unspecified: Secondary | ICD-10-CM

## 2020-08-18 DIAGNOSIS — Z793 Long term (current) use of hormonal contraceptives: Secondary | ICD-10-CM

## 2020-08-18 DIAGNOSIS — M436 Torticollis: Secondary | ICD-10-CM | POA: Diagnosis present

## 2020-08-18 DIAGNOSIS — N12 Tubulo-interstitial nephritis, not specified as acute or chronic: Secondary | ICD-10-CM | POA: Diagnosis present

## 2020-08-18 DIAGNOSIS — E872 Acidosis: Secondary | ICD-10-CM | POA: Diagnosis present

## 2020-08-18 DIAGNOSIS — A4151 Sepsis due to Escherichia coli [E. coli]: Principal | ICD-10-CM | POA: Diagnosis present

## 2020-08-18 DIAGNOSIS — R160 Hepatomegaly, not elsewhere classified: Secondary | ICD-10-CM | POA: Diagnosis present

## 2020-08-18 DIAGNOSIS — Z833 Family history of diabetes mellitus: Secondary | ICD-10-CM

## 2020-08-18 DIAGNOSIS — Z20822 Contact with and (suspected) exposure to covid-19: Secondary | ICD-10-CM | POA: Diagnosis present

## 2020-08-18 DIAGNOSIS — R059 Cough, unspecified: Secondary | ICD-10-CM

## 2020-08-18 DIAGNOSIS — E063 Autoimmune thyroiditis: Secondary | ICD-10-CM | POA: Diagnosis present

## 2020-08-18 HISTORY — DX: Tubulo-interstitial nephritis, not specified as acute or chronic: N12

## 2020-08-18 LAB — CBC WITH DIFFERENTIAL/PLATELET
Abs Immature Granulocytes: 0.16 10*3/uL — ABNORMAL HIGH (ref 0.00–0.07)
Basophils Absolute: 0 10*3/uL (ref 0.0–0.1)
Basophils Relative: 0 %
Eosinophils Absolute: 0 10*3/uL (ref 0.0–0.5)
Eosinophils Relative: 0 %
HCT: 37.9 % (ref 36.0–46.0)
Hemoglobin: 12.2 g/dL (ref 12.0–15.0)
Immature Granulocytes: 1 %
Lymphocytes Relative: 15 %
Lymphs Abs: 2.6 10*3/uL (ref 0.7–4.0)
MCH: 26.9 pg (ref 26.0–34.0)
MCHC: 32.2 g/dL (ref 30.0–36.0)
MCV: 83.5 fL (ref 80.0–100.0)
Monocytes Absolute: 2.1 10*3/uL — ABNORMAL HIGH (ref 0.1–1.0)
Monocytes Relative: 12 %
Neutro Abs: 12.4 10*3/uL — ABNORMAL HIGH (ref 1.7–7.7)
Neutrophils Relative %: 72 %
Platelets: 234 10*3/uL (ref 150–400)
RBC: 4.54 MIL/uL (ref 3.87–5.11)
RDW: 13.6 % (ref 11.5–15.5)
WBC: 17.4 10*3/uL — ABNORMAL HIGH (ref 4.0–10.5)
nRBC: 0 % (ref 0.0–0.2)

## 2020-08-18 LAB — LACTIC ACID, PLASMA
Lactic Acid, Venous: 1.3 mmol/L (ref 0.5–1.9)
Lactic Acid, Venous: 0.7 mmol/L (ref 0.5–1.9)

## 2020-08-18 LAB — URINALYSIS, MICROSCOPIC (REFLEX)

## 2020-08-18 LAB — COMPREHENSIVE METABOLIC PANEL
ALT: 39 U/L (ref 0–44)
AST: 31 U/L (ref 15–41)
Albumin: 3.9 g/dL (ref 3.5–5.0)
Alkaline Phosphatase: 83 U/L (ref 38–126)
Anion gap: 15 (ref 5–15)
BUN: 8 mg/dL (ref 6–20)
CO2: 21 mmol/L — ABNORMAL LOW (ref 22–32)
Calcium: 9 mg/dL (ref 8.9–10.3)
Chloride: 97 mmol/L — ABNORMAL LOW (ref 98–111)
Creatinine, Ser: 0.73 mg/dL (ref 0.44–1.00)
GFR, Estimated: >60 mL/min (ref >60)
Glucose, Bld: 102 mg/dL — ABNORMAL HIGH (ref 70–99)
Potassium: 3.3 mmol/L — ABNORMAL LOW (ref 3.5–5.1)
Sodium: 133 mmol/L — ABNORMAL LOW (ref 135–145)
Total Bilirubin: 2.1 mg/dL — ABNORMAL HIGH (ref 0.3–1.2)
Total Protein: 7.9 g/dL (ref 6.5–8.1)

## 2020-08-18 LAB — URINALYSIS, ROUTINE W REFLEX MICROSCOPIC
Glucose, UA: NEGATIVE mg/dL
Ketones, ur: >80 mg/dL — AB
Nitrite: NEGATIVE
Protein, ur: 100 mg/dL — AB
Specific Gravity, Urine: 1.025 (ref 1.005–1.030)
pH: 6 (ref 5.0–8.0)

## 2020-08-18 LAB — RESP PANEL BY RT-PCR (FLU A&B, COVID) ARPGX2
Influenza A by PCR: NEGATIVE
Influenza B by PCR: NEGATIVE
SARS Coronavirus 2 by RT PCR: NEGATIVE

## 2020-08-18 LAB — TROPONIN I (HIGH SENSITIVITY)
Troponin I (High Sensitivity): 5 ng/L (ref <18)
Troponin I (High Sensitivity): 6 ng/L (ref <18)

## 2020-08-18 LAB — PREGNANCY, URINE: Preg Test, Ur: NEGATIVE

## 2020-08-18 LAB — LIPASE, BLOOD: Lipase: 20 U/L (ref 11–51)

## 2020-08-18 MED ORDER — SODIUM CHLORIDE 0.9 % IV BOLUS
1000.0000 mL | Freq: Once | INTRAVENOUS | Status: AC
  Administered 2020-08-18: 1000 mL via INTRAVENOUS

## 2020-08-18 MED ORDER — IOHEXOL 300 MG/ML  SOLN
100.0000 mL | Freq: Once | INTRAMUSCULAR | Status: AC
  Administered 2020-08-18: 100 mL via INTRAVENOUS

## 2020-08-18 MED ORDER — SODIUM CHLORIDE 0.9 % IV SOLN
2.0000 g | Freq: Once | INTRAVENOUS | Status: AC
  Administered 2020-08-18: 2 g via INTRAVENOUS
  Filled 2020-08-18: qty 20

## 2020-08-18 MED ORDER — IBUPROFEN 800 MG PO TABS
800.0000 mg | ORAL_TABLET | Freq: Once | ORAL | Status: AC
  Administered 2020-08-18: 800 mg via ORAL
  Filled 2020-08-18: qty 1

## 2020-08-18 MED ORDER — KETOROLAC TROMETHAMINE 30 MG/ML IJ SOLN
30.0000 mg | Freq: Once | INTRAMUSCULAR | Status: AC
  Administered 2020-08-18: 30 mg via INTRAVENOUS
  Filled 2020-08-18: qty 1

## 2020-08-18 NOTE — ED Triage Notes (Signed)
Pt reports fever x1 week with n/v/d. Pt states she cant keep anything down and when she does it makes her stomach hurt. Pt has been taking tylenol and ibuprofen at home for the fever.

## 2020-08-18 NOTE — ED Notes (Signed)
ED Provider at bedside. 

## 2020-08-18 NOTE — ED Notes (Signed)
Multiple attempts IV access unsuccessful, some labwork obtained.  To be attempted via u/s

## 2020-08-18 NOTE — ED Notes (Signed)
Pt feeling a bit better, family at bedside

## 2020-08-18 NOTE — ED Notes (Signed)
NURSING NOTE: request made to attempt Ultrasound IV, visualized left arm, no distinguishing landmarks or clear identification of vessels could be noted, visualized left arm in Left AC area, attempt x 1 made, pt stated that arm felt funny, "tingling" sensation was noted , immediately withdrew catheter, CMS reassessed after attempt x 1. Primary RN made aware

## 2020-08-18 NOTE — ED Provider Notes (Signed)
[facility] Provider Note   CSN: [id number] Arrival date & time: 08/18/20  9833     History Chief Complaint  Patient presents with  . Fever  . Emesis    [name] is a 29 y.o. female who presents with concern for fever, myalgias, nausea for 8 days, now with 3 days of vomiting with NBNB emesis.  She endorses inability to tolerate anything p.o., even small sips of water without abdominal pain, nausea and vomiting. She states that her urine is now dark in color, orangish x 2 days, she is urinating less frequently, and feels that she does not realize she needs to urinate until she is near to having an episode of urinary incontinence (she has not had any incontinence at this point).    She denies numbness or tingling in her extremities, denies weakness in her extremities.  Denies blurry vision, double vision, photophobia.  She does endorse sensation that her heart is racing intermittently.  T-max at home has been 103.6, she has been treating her fever with ibuprofen, with relief, however she reports fevers after ibuprofen dosage fever will return.  She endorses worsening neck pain and headache, sensation of neck stiffness, some dizziness and lightheadedness this morning.  Patient has been vaccinated COVID-19.  She has had 2 negative Covid tests this week, and is also tested negative for influenza A/B.  Patient denies dysuria, hematuria, diarrhea, chest pain, shortness of breath, cough, sore throat.  Patient also reports that husband, mother, and in-laws are all also sick as well.  None of them tested positive for COVID-19 or influenza a/B.  HPI     Past Medical History:  Diagnosis Date  . Amenorrhea, secondary   . Bipolar disorder ([place])   . Galactorrhea   . Hirsutism   . Hyperprolactinemia ([place])   . Hypothyroidism   . Hypothyroidism, acquired, autoimmune   . Pituitary tumor   . Thyroiditis, autoimmune     Patient Active Problem List    Diagnosis Date Noted  . Hypothyroidism, acquired, autoimmune   . Thyroiditis, autoimmune   . Galactorrhea   . Hyperprolactinemia ([place])   . Pituitary tumor   . Amenorrhea, secondary   . Hirsutism   . Bipolar disorder ([place])   . Other specified acquired hypothyroidism 01/31/2011  . 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 None       OB History   No obstetric history on file.     Family History  Problem Relation Age of Onset  . Diabetes Paternal Grandfather   . Obesity Mother   . Hypertension Father   . Anemia Father   . Obesity Paternal Aunt   . Cancer Maternal Grandfather   . Diabetes Maternal Grandfather   . Thyroid disease Neg Hx   . Kidney disease Neg Hx   . Bipolar disorder Neg Hx     Social History   Tobacco Use  . Smoking status: Passive Smoke Exposure - Never Smoker  . Smokeless tobacco: Never Used  Substance Use Topics  . Alcohol use: Yes    Comment: socially  . Drug use: Yes    Types: Marijuana    Home Medications Prior to Admission medications   Medication Sig Start Date End Date Taking? Authorizing Provider  cephALEXin (KEFLEX) 500 MG capsule Take 1 capsule (500 mg total) by mouth 4 (four) times daily. 01/28/19   Hayden Rasmussen,  MD    Allergies    Patient has no known allergies.  Review of Systems   Review of Systems  Constitutional: Positive for activity change, appetite change, chills, fatigue and fever. Negative for diaphoresis.  HENT: Positive for congestion. Negative for sore throat, trouble swallowing and voice change.   Eyes: Negative for photophobia, pain and visual disturbance.  Respiratory: Negative for cough, chest tightness, shortness of breath and wheezing.   Cardiovascular: Positive for palpitations. Negative for chest pain and leg swelling.  Gastrointestinal: Positive for abdominal pain, nausea and vomiting. Negative for  abdominal distention, anal bleeding and diarrhea.  Endocrine: Negative.   Genitourinary: Positive for decreased urine volume and urgency. Negative for dysuria, frequency, hematuria, vaginal bleeding and vaginal discharge.  Musculoskeletal: Positive for myalgias, neck pain and neck stiffness.  Skin: Negative.   Neurological: Positive for light-headedness and headaches. Negative for dizziness, tremors, seizures, syncope, facial asymmetry, speech difficulty, weakness and numbness.  Hematological: Negative.   Psychiatric/Behavioral: Negative.     Physical Exam Updated Vital Signs BP 114/68   Pulse (!) 107   Temp 99.3 F (37.4 C) (Oral)   Resp 12   Ht 5\' 2"  (1.575 m)   Wt 57.2 kg   LMP 07/17/2020   SpO2 100%   BMI 23.05 kg/m   Physical Exam Vitals and nursing note reviewed.  Constitutional:      Appearance: Normal appearance. She is normal weight. She is not ill-appearing.  HENT:     Head: Normocephalic and atraumatic.     Nose: Nose normal.     Mouth/Throat:     Mouth: Mucous membranes are moist.     Pharynx: Oropharynx is clear. No oropharyngeal exudate or posterior oropharyngeal erythema.  Eyes:     General: No scleral icterus.       Right eye: No discharge.        Left eye: No discharge.     Extraocular Movements: Extraocular movements intact.     Conjunctiva/sclera: Conjunctivae normal.     Pupils: Pupils are equal, round, and reactive to light.  Neck:     Trachea: Trachea and phonation normal.     Meningeal: Brudzinski's sign and Kernig's sign absent.     Comments: Movement pain on range of motion of neck, without decreased range of motion.  Tenderness palpation of the posterior lateral neck, without muscular spasm. Cardiovascular:     Rate and Rhythm: Regular rhythm. Tachycardia present.     Pulses: Normal pulses.          Radial pulses are 2+ on the right side and 2+ on the left side.       Dorsalis pedis pulses are 2+ on the right side and 2+ on the left side.      Heart sounds: Normal heart sounds. No murmur heard.      Comments: Tachycardic to 119 on auscultation Pulmonary:     Effort: Pulmonary effort is normal. No tachypnea, bradypnea or respiratory distress.     Breath sounds: Normal breath sounds. No decreased breath sounds, wheezing, rhonchi or rales.  Chest:     Chest wall: No deformity, swelling, tenderness or crepitus.  Abdominal:     General: Bowel sounds are normal. There is no distension.     Palpations: Abdomen is soft. There is no hepatomegaly or mass.     Tenderness: There is no abdominal tenderness. There is no right CVA tenderness, left CVA tenderness, guarding or rebound.  Musculoskeletal:        General:  No deformity.     Cervical back: Normal range of motion and neck supple. No rigidity or crepitus. Pain with movement and muscular tenderness present. No spinous process tenderness.     Right lower leg: No edema.     Left lower leg: No edema.  Lymphadenopathy:     Cervical: No cervical adenopathy.  Skin:    General: Skin is warm and dry.     Capillary Refill: Capillary refill takes less than 2 seconds.  Neurological:     Mental Status: She is alert and oriented to person, place, and time. Mental status is at baseline.     Cranial Nerves: Cranial nerves are intact.     Sensory: Sensation is intact.     Motor: Motor function is intact.     Coordination: Coordination is intact.     Gait: Gait is intact.  Psychiatric:        Mood and Affect: Mood normal.     ED Results / Procedures / Treatments   Labs (all labs ordered are listed, but only abnormal results are displayed) Labs Reviewed  CBC WITH DIFFERENTIAL/PLATELET - Abnormal; Notable for the following components:      Result Value   WBC 17.4 (*)    Neutro Abs 12.4 (*)    Monocytes Absolute 2.1 (*)    Abs Immature Granulocytes 0.16 (*)    All other components within normal limits  URINALYSIS, ROUTINE W REFLEX MICROSCOPIC - Abnormal; Notable for the following  components:   Color, Urine ORANGE (*)    APPearance HAZY (*)    Hgb urine dipstick SMALL (*)    Bilirubin Urine MODERATE (*)    Ketones, ur >80 (*)    Protein, ur 100 (*)    Leukocytes,Ua TRACE (*)    All other components within normal limits  COMPREHENSIVE METABOLIC PANEL - Abnormal; Notable for the following components:   Sodium 133 (*)    Potassium 3.3 (*)    Chloride 97 (*)    CO2 21 (*)    Glucose, Bld 102 (*)    Total Bilirubin 2.1 (*)    All other components within normal limits  URINALYSIS, MICROSCOPIC (REFLEX) - Abnormal; Notable for the following components:   Bacteria, UA MANY (*)    All other components within normal limits  CULTURE, BLOOD (ROUTINE X 2)  URINE CULTURE  CULTURE, BLOOD (ROUTINE X 2)  LACTIC ACID, PLASMA  LACTIC ACID, PLASMA  PREGNANCY, URINE  LIPASE, BLOOD  TROPONIN I (HIGH SENSITIVITY)  TROPONIN I (HIGH SENSITIVITY)    EKG EKG Interpretation  Date/Time:  Friday August 18 2020 16:49:21 EST Ventricular Rate:  127 PR Interval:    QRS Duration: 70 QT Interval:  281 QTC Calculation: 409 R Axis:   59 Text Interpretation: Sinus tachycardia Borderline T abnormalities, inferior leads No STEMI Confirmed by Nanda Quinton [phone]) on 08/18/2020 6:51:50 PM   Radiology CT ABDOMEN PELVIS W CONTRAST  Result Date: 08/18/2020 CLINICAL DATA:  1 week history of fever with nausea vomiting and diarrhea. EXAM: CT ABDOMEN AND PELVIS WITH CONTRAST TECHNIQUE: Multidetector CT imaging of the abdomen and pelvis was performed using the standard protocol following bolus administration of intravenous contrast. CONTRAST:  143mL OMNIPAQUE IOHEXOL 300 MG/ML  SOLN COMPARISON:  09/18/2018 FINDINGS: Lower chest: Unremarkable. Hepatobiliary: No suspicious focal abnormality within the liver parenchyma. Liver measures 18.5 cm craniocaudal length, enlarged. There is no evidence for gallstones, gallbladder wall thickening, or pericholecystic fluid. No intrahepatic or extrahepatic  biliary dilation. Pancreas: No focal  mass lesion. No dilatation of the main duct. No intraparenchymal cyst. No peripancreatic edema. Spleen: No splenomegaly. No focal mass lesion. Adrenals/Urinary Tract: No adrenal nodule or mass. Right kidney unremarkable. Segmental edema is noted in multiple regions of the left kidney, involving the upper pole, interpolar region, and lower pole. CT imaging features are compatible with pyelonephritis. 10 mm low-density focus in the lower pole may represent evolving phlegmon, but no discrete intrarenal abscess at this time. No evidence for hydroureter. The urinary bladder appears normal for the degree of distention. Stomach/Bowel: Stomach is unremarkable. No gastric wall thickening. No evidence of outlet obstruction. Duodenum is normally positioned as is the ligament of Treitz. No small bowel wall thickening. No small bowel dilatation. The terminal ileum is normal. No gross colonic mass. No colonic wall thickening. Vascular/Lymphatic: No abdominal aortic aneurysm. No abdominal aortic atherosclerotic calcification. There is no gastrohepatic or hepatoduodenal ligament lymphadenopathy. No retroperitoneal or mesenteric lymphadenopathy. No pelvic sidewall lymphadenopathy. Reproductive: Marked fibroid disease noted in the uterus including a large exophytic fundal fibroid measuring 9.4 x 9.2 x 7.3 cm, similar to 09/18/2018. There is no adnexal mass. Other: No substantial intraperitoneal free fluid. Musculoskeletal: No worrisome lytic or sclerotic osseous abnormality. IMPRESSION: 1. Multiple areas of segmental edema in the left kidney with CT imaging features compatible with pyelonephritis. 10 mm low-density focus in the lower pole may represent evolving phlegmon, but no discrete rim enhancing intrarenal abscess at this time. 2. Marked fibroid disease in the uterus including a large exophytic fundal fibroid measuring up to 9.4 cm similar to 09/18/2018. 3. Hepatomegaly. Electronically Signed    By: Misty Stanley M.D.   On: 08/18/2020 20:22   DG Chest Port 1 View  Result Date: 08/18/2020 CLINICAL DATA:  Fever for a week.  Nausea, vomiting, and diarrhea. EXAM: PORTABLE CHEST 1 VIEW COMPARISON:  Jan 28, 2019 FINDINGS: The heart size and mediastinal contours are within normal limits. Both lungs are clear. The visualized skeletal structures are unremarkable. IMPRESSION: No active disease. Electronically Signed   By: Dorise Bullion III M.D   On: 08/18/2020 14:30   US Abdomen Limited RUQ (LIVER/GB)  Result Date: 08/18/2020 CLINICAL DATA:  Postprandial nausea and vomiting, diarrhea, fever for 1 week EXAM: ULTRASOUND ABDOMEN LIMITED RIGHT UPPER QUADRANT COMPARISON:  09/18/2018 FINDINGS: Gallbladder: No gallstones or wall thickening visualized. No sonographic Murphy sign noted by sonographer. Common bile duct: Diameter: 2 mm Liver: No focal lesion identified. Within normal limits in parenchymal echogenicity. Portal vein is patent on color Doppler imaging with normal direction of blood flow towards the liver. Other: None. IMPRESSION: 1. Unremarkable right upper quadrant ultrasound. Electronically Signed   By: Randa Ngo M.D.   On: 08/18/2020 18:39    Procedures Procedures (including critical care time)  Medications Ordered in ED Medications  sodium chloride 0.9 % bolus 1,000 mL (0 mLs Intravenous Stopped 08/18/20 1847)  ibuprofen (ADVIL) tablet 800 mg (800 mg Oral Given 08/18/20 1721)  cefTRIAXone (ROCEPHIN) 2 g in sodium chloride 0.9 % 100 mL IVPB (0 g Intravenous Stopped 08/18/20 1847)  sodium chloride 0.9 % bolus 1,000 mL ( Intravenous Stopped 08/18/20 1935)  iohexol (OMNIPAQUE) 300 MG/ML solution 100 mL (100 mLs Intravenous Contrast Given 08/18/20 1938)    ED Course  I have reviewed the triage vital signs and the nursing notes.  Pertinent labs & imaging results that were available during my care of the patient were reviewed by me and considered in my medical decision making (see chart  for details).  MDM Rules/Calculators/A&P                         29 year old female presents with 8 days of fever, myalgia, now with worsening headache, neck pain, nausea and vomiting x3 days.  Patient has tested negative twice for COVID-19, has tested negative for influenza a/B.  Differential diagnosis for this patient   Patient tachycardic to 117 on intake, temperature 99.7 at that time.  Vital signs otherwise normal.  Physical exam concerning for tachycardia, intermittent neck pain with movement.  Cardiopulmonary exam is normal, negative Kernig and Brudzinski's.  Less concern for meningitis at this time.  We will proceed with fluid bolus, ibuprofen.  ED sepsis order set utilized with concern for possible evolving sepsis in this patient.  CBC with leukocytosis of 15.4, elevated neutrophils of 12.4.  CMP with mild hypokalemia and 3.3, elevated total bilirubin to 2.1.  Will proceed with right upper quadrant ultrasound.  UA significant for orange color, moderate bilirubin, small hemoglobin, large amount of proteins, leukocytes, many bacteria.  Lipase is normal at 20.    Lactic acid is normal x2.  Troponin is normal, 5.  Delta troponin negative.  EKG with sinus tachycardia, per 19 abnormalities in the inferior leads.  No STEMI.  Chest x-ray negative.  Right upper quadrant ultrasound negative. We will proceed with broad-spectrum antibiotics, and order CT abdomen pelvis.  Care of this patient was signed out to attending physician at the end of this providers ED shift.  Physical exam findings as well as laboratory studies and imaging up to this point have been discussed at length with attending physician. He will follow up with CT results, and will develop treatment plan and disposition appropriate to findings.  I appreciate his collaboration care of this patient.  Final Clinical Impression(s) / ED Diagnoses Final diagnoses:  Elevated bilirubin  Pyelonephritis    Rx / DC  Orders ED Discharge Orders    None       Aura Dials 08/18/20 2100    Margette Fast, MD 08/19/20 1125

## 2020-08-18 NOTE — ED Notes (Signed)
Additional attempts at IV access via u/s unsuccessful x2.  Physician made aware.

## 2020-08-18 NOTE — ED Notes (Signed)
Pt has had 2 rapid covid swabs that resulted negative and a PCR test pending.

## 2020-08-18 NOTE — ED Notes (Signed)
Pt vomited, remains slightly nauseous.

## 2020-08-19 ENCOUNTER — Encounter (HOSPITAL_COMMUNITY): Payer: Self-pay | Admitting: Family Medicine

## 2020-08-19 ENCOUNTER — Other Ambulatory Visit: Payer: Self-pay

## 2020-08-19 DIAGNOSIS — R160 Hepatomegaly, not elsewhere classified: Secondary | ICD-10-CM | POA: Diagnosis present

## 2020-08-19 DIAGNOSIS — R17 Unspecified jaundice: Secondary | ICD-10-CM

## 2020-08-19 DIAGNOSIS — N12 Tubulo-interstitial nephritis, not specified as acute or chronic: Secondary | ICD-10-CM

## 2020-08-19 LAB — CBC
HCT: 36.8 % (ref 36.0–46.0)
Hemoglobin: 11.2 g/dL — ABNORMAL LOW (ref 12.0–15.0)
MCH: 26.7 pg (ref 26.0–34.0)
MCHC: 30.4 g/dL (ref 30.0–36.0)
MCV: 87.6 fL (ref 80.0–100.0)
Platelets: 235 10*3/uL (ref 150–400)
RBC: 4.2 MIL/uL (ref 3.87–5.11)
RDW: 14 % (ref 11.5–15.5)
WBC: 14.1 10*3/uL — ABNORMAL HIGH (ref 4.0–10.5)
nRBC: 0 % (ref 0.0–0.2)

## 2020-08-19 LAB — COMPREHENSIVE METABOLIC PANEL
ALT: 37 U/L (ref 0–44)
AST: 23 U/L (ref 15–41)
Albumin: 3.8 g/dL (ref 3.5–5.0)
Alkaline Phosphatase: 85 U/L (ref 38–126)
Anion gap: 14 (ref 5–15)
BUN: 6 mg/dL (ref 6–20)
CO2: 20 mmol/L — ABNORMAL LOW (ref 22–32)
Calcium: 8.3 mg/dL — ABNORMAL LOW (ref 8.9–10.3)
Chloride: 100 mmol/L (ref 98–111)
Creatinine, Ser: 0.61 mg/dL (ref 0.44–1.00)
GFR, Estimated: >60 mL/min (ref >60)
Glucose, Bld: 98 mg/dL (ref 70–99)
Potassium: 3.6 mmol/L (ref 3.5–5.1)
Sodium: 134 mmol/L — ABNORMAL LOW (ref 135–145)
Total Bilirubin: 1.4 mg/dL — ABNORMAL HIGH (ref 0.3–1.2)
Total Protein: 7.4 g/dL (ref 6.5–8.1)

## 2020-08-19 LAB — HIV ANTIBODY (ROUTINE TESTING W REFLEX): HIV Screen 4th Generation wRfx: NONREACTIVE

## 2020-08-19 LAB — TSH: TSH: 13.238 u[IU]/mL — ABNORMAL HIGH (ref 0.350–4.500)

## 2020-08-19 MED ORDER — ACETAMINOPHEN 650 MG RE SUPP: 650.0000 mg | Freq: Four times a day (QID) | RECTAL | Status: DC | PRN

## 2020-08-19 MED ORDER — IBUPROFEN 400 MG PO TABS
400.0000 mg | ORAL_TABLET | Freq: Four times a day (QID) | ORAL | Status: DC | PRN
  Administered 2020-08-19: 400 mg via ORAL
  Filled 2020-08-19: qty 1

## 2020-08-19 MED ORDER — SODIUM CHLORIDE 0.9 % IV SOLN: Freq: Once | INTRAVENOUS | Status: AC

## 2020-08-19 MED ORDER — POTASSIUM CHLORIDE CRYS ER 20 MEQ PO TBCR
40.0000 meq | EXTENDED_RELEASE_TABLET | Freq: Once | ORAL | Status: AC
  Administered 2020-08-19: 40 meq via ORAL
  Filled 2020-08-19: qty 2

## 2020-08-19 MED ORDER — SODIUM CHLORIDE 0.9 % IV SOLN
1.0000 g | INTRAVENOUS | Status: DC
  Administered 2020-08-19 – 2020-08-20 (×2): 1 g via INTRAVENOUS
  Filled 2020-08-19 (×2): qty 1
  Filled 2020-08-19: qty 10

## 2020-08-19 MED ORDER — ONDANSETRON HCL 4 MG/2ML IJ SOLN: 4.0000 mg | Freq: Four times a day (QID) | INTRAMUSCULAR | Status: DC | PRN

## 2020-08-19 MED ORDER — ONDANSETRON HCL 4 MG PO TABS
4.0000 mg | ORAL_TABLET | Freq: Four times a day (QID) | ORAL | Status: DC | PRN
  Administered 2020-08-20: 4 mg via ORAL
  Filled 2020-08-19: qty 1

## 2020-08-19 MED ORDER — ENOXAPARIN SODIUM 40 MG/0.4ML ~~LOC~~ SOLN
40.0000 mg | SUBCUTANEOUS | Status: DC
  Administered 2020-08-19 – 2020-08-21 (×3): 40 mg via SUBCUTANEOUS
  Filled 2020-08-19 (×3): qty 0.4

## 2020-08-19 MED ORDER — FENTANYL CITRATE (PF) 100 MCG/2ML IJ SOLN
50.0000 ug | Freq: Once | INTRAMUSCULAR | Status: AC
  Administered 2020-08-19: 50 ug via INTRAVENOUS
  Filled 2020-08-19: qty 2

## 2020-08-19 MED ORDER — ACETAMINOPHEN 325 MG PO TABS
650.0000 mg | ORAL_TABLET | Freq: Four times a day (QID) | ORAL | Status: DC | PRN
  Administered 2020-08-19 – 2020-08-21 (×5): 650 mg via ORAL
  Filled 2020-08-19 (×5): qty 2

## 2020-08-19 MED ORDER — HYDROCODONE-ACETAMINOPHEN 5-325 MG PO TABS
1.0000 | ORAL_TABLET | Freq: Four times a day (QID) | ORAL | Status: DC | PRN
  Administered 2020-08-19: 2 via ORAL
  Administered 2020-08-19: 1 via ORAL
  Filled 2020-08-19: qty 2
  Filled 2020-08-19: qty 1

## 2020-08-19 MED ORDER — KETOROLAC TROMETHAMINE 30 MG/ML IJ SOLN: 30.0000 mg | Freq: Four times a day (QID) | INTRAMUSCULAR | Status: DC | PRN

## 2020-08-19 NOTE — Plan of Care (Signed)
POC initiated 

## 2020-08-19 NOTE — Progress Notes (Signed)
   08/19/20 1817  Assess: MEWS Score  Temp (!) 102.4 F (39.1 C)  BP 120/67  Pulse Rate (!) 110  Resp 18  Level of Consciousness Alert  SpO2 100 %  O2 Device Room Air  Assess: MEWS Score  MEWS Temp 2  MEWS Systolic 0  MEWS Pulse 1  MEWS RR 0  MEWS LOC 0  MEWS Score 3  MEWS Score Color Yellow  Assess: if the MEWS score is Yellow or Red  Were vital signs taken at a resting state? Yes  Focused Assessment No change from prior assessment  Early Detection of Sepsis Score *See Row Information* Low  MEWS guidelines implemented *See Row Information* No, previously red, continue vital signs every 4 hours  Treat  Pain Scale 0-10  Pain Score 0  Escalate  MEWS: Escalate Yellow: discuss with charge nurse/RN and consider discussing with provider and RRT  Notify: Charge Nurse/RN  Name of Charge Nurse/RN Notified jessie rn  Date Charge Nurse/RN Notified 08/19/20  Time Charge Nurse/RN Notified 1819   rn will notify md that pt temp did not decrease. Pt remains stable no changes other than feeling funny in her head. She states her head feels funny. Rn will continue to monitor.

## 2020-08-19 NOTE — Progress Notes (Signed)
Patient ID: [name], female   DOB: 07-23-1991, 29 y.o.   MRN: [id number] Patient was admitted early this morning for fever and vomiting secondary to acute pyelonephritis and has been started on IV antibiotics.  Patient seen and examined at bedside and plan of care discussed with her.  I have reviewed patient medical records including this morning's H&P, current vitals, labs and medications myself.  Repeat a.m. labs, continue antibiotics and follow cultures.

## 2020-08-19 NOTE — Progress Notes (Addendum)
   08/19/20 1717  Assess: MEWS Score  Temp (!) 102.7 F (39.3 C)  BP 125/67  Pulse Rate (!) 115  Resp 18  Level of Consciousness Alert  SpO2 100 %  O2 Device Room Air  Assess: MEWS Score  MEWS Temp 2  MEWS Systolic 0  MEWS Pulse 2  MEWS RR 0  MEWS LOC 0  MEWS Score 4  MEWS Score Color Red  Assess: if the MEWS score is Yellow or Red  Were vital signs taken at a resting state? Yes  Focused Assessment No change from prior assessment  Early Detection of Sepsis Score *See Row Information* Low  MEWS guidelines implemented *See Row Information* Yes  Treat  Pain Scale 0-10  Pain Score 0  Escalate  MEWS: Escalate Red: discuss with charge nurse/RN and provider, consider discussing with RRT  Notify: Charge Nurse/RN  Name of Charge Nurse/RN Notified ardae lea discussued with Financial controller rn  Date Charge Nurse/RN Notified 08/19/20  Time Charge Nurse/RN Notified 1720  Notify: Provider  Provider Name/Title dr. Starla Link  Date Provider Notified 08/19/20  Time Provider Notified 1735  Notification Type Call  Notification Reason Change in status (red mews)  Response No new orders (associated with infection)  Date of Provider Response 08/19/20  Time of Provider Response 1735  Notify: Rapid Response  Name of Rapid Response RN Notified Christian RN  Date Rapid Response Notified 08/19/20  Time Rapid Response Notified 1736

## 2020-08-19 NOTE — ED Notes (Signed)
MD made aware of Yellow mews, Pain addressed with meds and IV fluids.

## 2020-08-19 NOTE — H&P (Signed)
History and Physical    [name] KZS:[id number] DOB: Jul 09, 1991 DOA: 08/18/2020  PCP: Patient, No Pcp Per  Patient coming from: Home  I have personally briefly reviewed patient's old medical records in [place]  Chief Complaint: Fever, emesis  HPI: [name] is a 29 y.o. female with medical history significant of autoimmune hypothyroidism, pituitary tumor with galactorrhea.  Pt not on any chronic meds for either of these for several years now with no reported problems.  Pt presents to the ED with c/o fever, myalgias, nausea and vomiting.  Symptoms onset 8 days ago, worsening with severe N/V for past 3 days.  NBNB emesis.  Unable to tolerate anything PO even water.  No abd pain.  Urine now dark / orange for past 2 days with decreased urination.  Does have urinary frequency.  Tm 103.6 at home.  Taking Ibuprofen which helps.  2 negative COVID tests this week.  Has had COVID vaccine.   ED Course: Tbili 2.1.  CT abd/pelvis: 1) L pyelonephritis with phlegmon at inferior pole of kidney, 2) hepatomegaly.   Review of Systems: As per HPI, otherwise all review of systems negative.  Past Medical History:  Diagnosis Date  . Amenorrhea, secondary   . Bipolar disorder ([place])   . Galactorrhea   . Hirsutism   . Hyperprolactinemia ([place])   . Hypothyroidism   . Hypothyroidism, acquired, autoimmune   . Pituitary tumor   . Thyroiditis, autoimmune     Past Surgical History:  Procedure Laterality Date  . None       reports that she is a non-smoker but has been exposed to tobacco smoke. She has never used smokeless tobacco. She reports current alcohol use. She reports current drug use. Drug: Marijuana.  No Known Allergies  Family History  Problem Relation Age of Onset  . Diabetes Paternal Grandfather   . Obesity Mother   . Hypertension Father   . Anemia Father   . Obesity Paternal Aunt   . Cancer Maternal Grandfather   . Diabetes Maternal Grandfather   . Thyroid  disease Neg Hx   . Kidney disease Neg Hx   . Bipolar disorder Neg Hx      Prior to Admission medications   Medication Sig Start Date End Date Taking? Authorizing Provider  [place] 28 0.25-35 MG-MCG tablet Take 1 tablet by mouth daily. 08/07/20  Yes [provider]  cephALEXin (KEFLEX) 500 MG capsule Take 1 capsule (500 mg total) by mouth 4 (four) times daily. 01/28/19   Hayden Rasmussen, MD    Physical Exam: Vitals:   08/18/20 2300 08/19/20 0000 08/19/20 0045 08/19/20 0149  BP: 119/81 114/81 112/73 112/79  Pulse: (!) 105 99 (!) 101 (!) 102  Resp: (!) 21 20 (!) 22 19  Temp:    99.2 F (37.3 C)  TempSrc:    Oral  SpO2: 98% 100% 99% 100%  Weight:    57.2 kg  Height:        Constitutional: NAD, calm, comfortable Eyes: PERRL, lids and conjunctivae normal ENMT: Mucous membranes are moist. Posterior pharynx clear of any exudate or lesions.Normal dentition.  Neck: normal, supple, no masses, no thyromegaly Respiratory: clear to auscultation bilaterally, no wheezing, no crackles. Normal respiratory effort. No accessory muscle use.  Cardiovascular: Regular rate and rhythm, no murmurs / rubs / gallops. No extremity edema. 2+ pedal pulses. No carotid bruits.  Abdomen: no tenderness, no masses palpated. No hepatosplenomegaly. Bowel sounds positive.  Musculoskeletal: no clubbing /  cyanosis. No joint deformity upper and lower extremities. Good ROM, no contractures. Normal muscle tone.  Skin: no rashes, lesions, ulcers. No induration Neurologic: CN 2-12 grossly intact. Sensation intact, DTR normal. Strength 5/5 in all 4.  Psychiatric: Normal judgment and insight. Alert and oriented x 3. Normal mood.    Labs on Admission: I have personally reviewed following labs and imaging studies  CBC: Recent Labs  Lab 08/18/20 1420  WBC 17.4*  NEUTROABS 12.4*  HGB 12.2  HCT 37.9  MCV 83.5  PLT 062   Basic Metabolic Panel: Recent Labs  Lab 08/18/20 1620  NA 133*  K 3.3*  CL 97*    CO2 21*  GLUCOSE 102*  BUN 8  CREATININE 0.73  CALCIUM 9.0   GFR: Estimated Creatinine Clearance: 83.5 mL/min (by C-G formula based on SCr of 0.73 mg/dL). Liver Function Tests: Recent Labs  Lab 08/18/20 1620  AST 31  ALT 39  ALKPHOS 83  BILITOT 2.1*  PROT 7.9  ALBUMIN 3.9   Recent Labs  Lab 08/18/20 1620  LIPASE 20   No results for input(s): AMMONIA in the last 168 hours. Coagulation Profile: No results for input(s): INR, PROTIME in the last 168 hours. Cardiac Enzymes: No results for input(s): CKTOTAL, CKMB, CKMBINDEX, TROPONINI in the last 168 hours. BNP (last 3 results) No results for input(s): PROBNP in the last 8760 hours. HbA1C: No results for input(s): HGBA1C in the last 72 hours. CBG: No results for input(s): GLUCAP in the last 168 hours. Lipid Profile: No results for input(s): CHOL, HDL, LDLCALC, TRIG, CHOLHDL, LDLDIRECT in the last 72 hours. Thyroid Function Tests: No results for input(s): TSH, T4TOTAL, FREET4, T3FREE, THYROIDAB in the last 72 hours. Anemia Panel: No results for input(s): VITAMINB12, FOLATE, FERRITIN, TIBC, IRON, RETICCTPCT in the last 72 hours. Urine analysis:    Component Value Date/Time   COLORURINE ORANGE (A) 08/18/2020 1600   APPEARANCEUR HAZY (A) 08/18/2020 1600   LABSPEC 1.025 08/18/2020 1600   PHURINE 6.0 08/18/2020 1600   GLUCOSEU NEGATIVE 08/18/2020 1600   HGBUR SMALL (A) 08/18/2020 1600   BILIRUBINUR MODERATE (A) 08/18/2020 1600   KETONESUR >80 (A) 08/18/2020 1600   PROTEINUR 100 (A) 08/18/2020 1600   UROBILINOGEN 1.0 04/25/2013 1432   NITRITE NEGATIVE 08/18/2020 1600   LEUKOCYTESUR TRACE (A) 08/18/2020 1600    Radiological Exams on Admission: CT ABDOMEN PELVIS W CONTRAST  Result Date: 08/18/2020 CLINICAL DATA:  1 week history of fever with nausea vomiting and diarrhea. EXAM: CT ABDOMEN AND PELVIS WITH CONTRAST TECHNIQUE: Multidetector CT imaging of the abdomen and pelvis was performed using the standard protocol  following bolus administration of intravenous contrast. CONTRAST:  146mL OMNIPAQUE IOHEXOL 300 MG/ML  SOLN COMPARISON:  09/18/2018 FINDINGS: Lower chest: Unremarkable. Hepatobiliary: No suspicious focal abnormality within the liver parenchyma. Liver measures 18.5 cm craniocaudal length, enlarged. There is no evidence for gallstones, gallbladder wall thickening, or pericholecystic fluid. No intrahepatic or extrahepatic biliary dilation. Pancreas: No focal mass lesion. No dilatation of the main duct. No intraparenchymal cyst. No peripancreatic edema. Spleen: No splenomegaly. No focal mass lesion. Adrenals/Urinary Tract: No adrenal nodule or mass. Right kidney unremarkable. Segmental edema is noted in multiple regions of the left kidney, involving the upper pole, interpolar region, and lower pole. CT imaging features are compatible with pyelonephritis. 10 mm low-density focus in the lower pole may represent evolving phlegmon, but no discrete intrarenal abscess at this time. No evidence for hydroureter. The urinary bladder appears normal for the degree of distention.  Stomach/Bowel: Stomach is unremarkable. No gastric wall thickening. No evidence of outlet obstruction. Duodenum is normally positioned as is the ligament of Treitz. No small bowel wall thickening. No small bowel dilatation. The terminal ileum is normal. No gross colonic mass. No colonic wall thickening. Vascular/Lymphatic: No abdominal aortic aneurysm. No abdominal aortic atherosclerotic calcification. There is no gastrohepatic or hepatoduodenal ligament lymphadenopathy. No retroperitoneal or mesenteric lymphadenopathy. No pelvic sidewall lymphadenopathy. Reproductive: Marked fibroid disease noted in the uterus including a large exophytic fundal fibroid measuring 9.4 x 9.2 x 7.3 cm, similar to 09/18/2018. There is no adnexal mass. Other: No substantial intraperitoneal free fluid. Musculoskeletal: No worrisome lytic or sclerotic osseous abnormality.  IMPRESSION: 1. Multiple areas of segmental edema in the left kidney with CT imaging features compatible with pyelonephritis. 10 mm low-density focus in the lower pole may represent evolving phlegmon, but no discrete rim enhancing intrarenal abscess at this time. 2. Marked fibroid disease in the uterus including a large exophytic fundal fibroid measuring up to 9.4 cm similar to 09/18/2018. 3. Hepatomegaly. Electronically Signed   By: Misty Stanley M.D.   On: 08/18/2020 20:22   DG Chest Port 1 View  Result Date: 08/18/2020 CLINICAL DATA:  Fever for a week.  Nausea, vomiting, and diarrhea. EXAM: PORTABLE CHEST 1 VIEW COMPARISON:  Jan 28, 2019 FINDINGS: The heart size and mediastinal contours are within normal limits. Both lungs are clear. The visualized skeletal structures are unremarkable. IMPRESSION: No active disease. Electronically Signed   By: Dorise Bullion III M.D   On: 08/18/2020 14:30   US Abdomen Limited RUQ (LIVER/GB)  Result Date: 08/18/2020 CLINICAL DATA:  Postprandial nausea and vomiting, diarrhea, fever for 1 week EXAM: ULTRASOUND ABDOMEN LIMITED RIGHT UPPER QUADRANT COMPARISON:  09/18/2018 FINDINGS: Gallbladder: No gallstones or wall thickening visualized. No sonographic Murphy sign noted by sonographer. Common bile duct: Diameter: 2 mm Liver: No focal lesion identified. Within normal limits in parenchymal echogenicity. Portal vein is patent on color Doppler imaging with normal direction of blood flow towards the liver. Other: None. IMPRESSION: 1. Unremarkable right upper quadrant ultrasound. Electronically Signed   By: Randa Ngo M.D.   On: 08/18/2020 18:39    EKG: Independently reviewed.  Assessment/Plan Principal Problem:   Pyelonephritis Active Problems:   Hepatomegaly   Hyperbilirubinemia    1. Pyelonephritis with sepsis - 1. IVF 2. Rocephin 3. UCx and BCx pending 4. Repeat CBC, CMP in AM 5. Zofran PRN nausea 2. Hepatomegaly, hyperbilirubinemia - 1. AST/ALT  nl 2. Unclear significance of these 2 (other than the hyperbilirubinemia and "orange urine" are a new change in the past 1 week with acute illness). 3. Will send message to GI to see if they can shed some light on the situation here (either formal consult or curbside, ill let them decide) 3. H/o autoimmune thyroiditis - 1. Not on any treatment anymore for extended time 2. Will check a TSH  DVT prophylaxis: Lovenox Code Status: Full Family Communication: No family in room Disposition Plan: Home after admit Consults called: Message Sent to Dr. Cristina Gong to take a look at case in AM Admission status: Place in 44   Elizabethann Lackey, [place] [facility]  How to contact the [facility] Attending or Consulting provider [place] or covering provider during after hours [place], for this patient?  1. Check the care team in [facility] and look for a) attending/consulting [facility] provider listed and b) the [facility] team listed 2. Log into [email or web address]  Amion Physician Scheduling and messaging  for groups and whole hospitals  On call and physician scheduling software for group practices, residents, hospitalists and other medical providers for call, clinic, rotation and shift schedules. On[employer] is a hospital-wide system for scheduling doctors and paging doctors on call. EasyPlot is for scientific plotting and data analysis.  [email or web address]  and use [place]'s universal password to access. If you do not have the password, please contact the hospital operator.  3. Locate the [facility] provider you are looking for under [facility] and page to a number that you can be directly reached. 4. If you still have difficulty reaching the provider, please page the [facility] (Director on Call) for the Hospitalists listed on amion for assistance.  08/19/2020, 3:10 AM

## 2020-08-19 NOTE — Progress Notes (Signed)
Pt stable at this time. Red mews protocol initiated based on pt vital signs. Pt stable with no needs at time of assessment with red mews. Pt hr elevated but regular. Family at bedside. Md aware of red mews. Pt given Tylenol. Rn will continue to monitor.

## 2020-08-20 ENCOUNTER — Inpatient Hospital Stay (HOSPITAL_COMMUNITY): Payer: Self-pay

## 2020-08-20 DIAGNOSIS — A419 Sepsis, unspecified organism: Secondary | ICD-10-CM

## 2020-08-20 DIAGNOSIS — D72829 Elevated white blood cell count, unspecified: Secondary | ICD-10-CM

## 2020-08-20 DIAGNOSIS — E872 Acidosis: Secondary | ICD-10-CM

## 2020-08-20 LAB — COMPREHENSIVE METABOLIC PANEL
ALT: 25 U/L (ref 0–44)
AST: 23 U/L (ref 15–41)
Albumin: 3 g/dL — ABNORMAL LOW (ref 3.5–5.0)
Alkaline Phosphatase: 73 U/L (ref 38–126)
Anion gap: 13 (ref 5–15)
BUN: <5 mg/dL — ABNORMAL LOW (ref 6–20)
CO2: 15 mmol/L — ABNORMAL LOW (ref 22–32)
Calcium: 8.4 mg/dL — ABNORMAL LOW (ref 8.9–10.3)
Chloride: 108 mmol/L (ref 98–111)
Creatinine, Ser: 0.53 mg/dL (ref 0.44–1.00)
GFR, Estimated: >60 mL/min (ref >60)
Glucose, Bld: 94 mg/dL (ref 70–99)
Potassium: 4.6 mmol/L (ref 3.5–5.1)
Sodium: 136 mmol/L (ref 135–145)
Total Bilirubin: 1.3 mg/dL — ABNORMAL HIGH (ref 0.3–1.2)
Total Protein: 6.2 g/dL — ABNORMAL LOW (ref 6.5–8.1)

## 2020-08-20 LAB — CBC WITH DIFFERENTIAL/PLATELET
Abs Immature Granulocytes: 0.27 10*3/uL — ABNORMAL HIGH (ref 0.00–0.07)
Basophils Absolute: 0 10*3/uL (ref 0.0–0.1)
Basophils Relative: 0 %
Eosinophils Absolute: 0.1 10*3/uL (ref 0.0–0.5)
Eosinophils Relative: 1 %
HCT: 29.9 % — ABNORMAL LOW (ref 36.0–46.0)
Hemoglobin: 9.4 g/dL — ABNORMAL LOW (ref 12.0–15.0)
Immature Granulocytes: 2 %
Lymphocytes Relative: 26 %
Lymphs Abs: 3.3 10*3/uL (ref 0.7–4.0)
MCH: 26.6 pg (ref 26.0–34.0)
MCHC: 31.4 g/dL (ref 30.0–36.0)
MCV: 84.5 fL (ref 80.0–100.0)
Monocytes Absolute: 1.5 10*3/uL — ABNORMAL HIGH (ref 0.1–1.0)
Monocytes Relative: 12 %
Neutro Abs: 7.4 10*3/uL (ref 1.7–7.7)
Neutrophils Relative %: 59 %
Platelets: 230 10*3/uL (ref 150–400)
RBC: 3.54 MIL/uL — ABNORMAL LOW (ref 3.87–5.11)
RDW: 13.8 % (ref 11.5–15.5)
WBC: 12.6 10*3/uL — ABNORMAL HIGH (ref 4.0–10.5)
nRBC: 0 % (ref 0.0–0.2)

## 2020-08-20 LAB — MAGNESIUM: Magnesium: 1.8 mg/dL (ref 1.7–2.4)

## 2020-08-20 MED ORDER — AZITHROMYCIN 250 MG PO TABS
500.0000 mg | ORAL_TABLET | Freq: Every day | ORAL | Status: DC
  Administered 2020-08-20 – 2020-08-21 (×2): 500 mg via ORAL
  Filled 2020-08-20 (×2): qty 2

## 2020-08-20 MED ORDER — POLYETHYLENE GLYCOL 3350 17 G PO PACK: 17.0000 g | PACK | Freq: Every day | ORAL | Status: DC | PRN

## 2020-08-20 MED ORDER — IBUPROFEN 400 MG PO TABS: 400.0000 mg | ORAL_TABLET | Freq: Four times a day (QID) | ORAL | Status: DC | PRN

## 2020-08-20 MED ORDER — SENNOSIDES-DOCUSATE SODIUM 8.6-50 MG PO TABS: 1.0000 | ORAL_TABLET | Freq: Two times a day (BID) | ORAL | Status: DC

## 2020-08-20 MED ORDER — HYDROCODONE-ACETAMINOPHEN 5-325 MG PO TABS: 1.0000 | ORAL_TABLET | Freq: Four times a day (QID) | ORAL | Status: DC | PRN

## 2020-08-20 MED ORDER — SODIUM BICARBONATE 8.4 % IV SOLN
INTRAVENOUS | Status: DC
  Filled 2020-08-20 (×2): qty 150

## 2020-08-20 NOTE — Plan of Care (Signed)
  Problem: Activity: Goal: Risk for activity intolerance will decrease Outcome: Progressing   Problem: Nutrition: Goal: Adequate nutrition will be maintained Outcome: Progressing   Problem: Pain Managment: Goal: General experience of comfort will improve Outcome: Progressing   

## 2020-08-20 NOTE — Plan of Care (Signed)
  Problem: Education: Goal: Knowledge of General Education information will improve Description: Including pain rating scale, medication(s)/side effects and non-pharmacologic comfort measures Outcome: Progressing   Problem: Health Behavior/Discharge Planning: Goal: Ability to manage health-related needs will improve Outcome: Progressing   Problem: Clinical Measurements: Goal: Ability to maintain clinical measurements within normal limits will improve Outcome: Progressing   Problem: Activity: Goal: Risk for activity intolerance will decrease Outcome: Progressing   Problem: Nutrition: Goal: Adequate nutrition will be maintained Outcome: Progressing   Problem: Elimination: Goal: Will not experience complications related to urinary retention Outcome: Progressing   Problem: Pain Managment: Goal: General experience of comfort will improve Outcome: Progressing

## 2020-08-20 NOTE — Plan of Care (Signed)
?  Problem: Clinical Measurements: ?Goal: Respiratory complications will improve ?Outcome: Progressing ?  ?Problem: Clinical Measurements: ?Goal: Cardiovascular complication will be avoided ?Outcome: Progressing ?  ?Problem: Coping: ?Goal: Level of anxiety will decrease ?Outcome: Progressing ?  ?Problem: Pain Managment: ?Goal: General experience of comfort will improve ?Outcome: Progressing ?  ?Problem: Safety: ?Goal: Ability to remain free from injury will improve ?Outcome: Progressing ?  ?

## 2020-08-20 NOTE — Progress Notes (Signed)
Patient ID: [name], female   DOB: June 16, 1991, 29 y.o.   MRN: [id number]  PROGRESS NOTE    [name]  IRS:[id number] DOB: [birth year] DOA: 08/18/2020 PCP: Patient, No Pcp Per   Brief Narrative:  29 year old female with history of autoimmune hypothyroidism, pituitary tumor with galactorrhea presented with fever and vomiting along with increased frequency of urination.  On presentation, T bili was 2.1.  CT of the abdomen and pelvis showed left pyelonephritis with possible phlegmon at inferior pole of kidney along with hepatomegaly.  She was started on IV fluids and antibiotics.  Assessment & Plan:   Sepsis: Present on admission Acute pyelonephritis with possible phlegmon Leukocytosis -Patient presented with fever, increased frequency of urination and was found to have elevated temperature, tachycardia with leukocytosis and pyelonephritis on CT of the abdomen -T-max of 102.7 over the last 24 hours. -Blood cultures negative so far.  Urine culture growing more than 100,000 colonies per mL of gram-negative rods.  Continue Rocephin.  IV fluids as below.  WBC is improving.  If patient continues to spike temperatures, might need repeat CT of the abdomen/pelvis to rule out abscess -Repeat a.m. labs.  Acute metabolic acidosis -Questionable cause.  No diarrhea.  Will treat with bicarb drip.  Repeat a.m. labs  Mildly elevated bilirubin -Presented with bilirubin of 2.1 which is improving.  No need for any other intervention including GI work-up  History of autoimmune thyroiditis -Outpatient follow-up with PCP.  DVT prophylaxis: Lovenox Code Status: Full Family Communication: None at bedside Disposition Plan: Status is: Inpatient  Remains inpatient appropriate because:Inpatient level of care appropriate due to severity of illness   Dispo: The patient is from: Home              Anticipated d/c is to: Home              Anticipated d/c date is: 1 day              Patient currently is  not medically stable to d/c.   Consultants: None  Procedures: None  Antimicrobials: Rocephin from 08/18/2020 onwards   Subjective: Patient seen and examined at bedside.  Complains of intermittent cough with intermittent lower abdominal and left flank pain.  Had fevers yesterday evening.  No bowel movement since admission.  Currently not nauseous or vomiting. Objective: Vitals:   08/20/20 0510 08/20/20 0741 08/20/20 0934 08/20/20 1012  BP: 124/87 (!) 111/57 120/83 125/86  Pulse: (!) 106 (!) 105 (!) 101 88  Resp: 18 18 17 18   Temp: 99.6 F (37.6 C) 99.2 F (37.3 C) 99.3 F (37.4 C) 98.2 F (36.8 C)  TempSrc: Oral Oral Oral Oral  SpO2: 100% 100% 100% 100%  Weight:      Height:        Intake/Output Summary (Last 24 hours) at 08/20/2020 1051 Last data filed at 08/20/2020 0952 Gross per 24 hour  Intake 897.46 ml  Output --  Net 897.46 ml   Filed Weights   08/18/20 0941 08/19/20 0149 08/19/20 0422  Weight: 57.2 kg 57.2 kg 57.2 kg    Examination:  General exam: Appears calm and comfortable  Respiratory system: Bilateral decreased breath sounds at bases Cardiovascular system: S1 & S2 heard, tachycardic Gastrointestinal system: Abdomen is nondistended, soft and mildly tender in the lower quadrant. Normal bowel sounds heard. Genitourinary: Left flank tenderness present Extremities: No cyanosis, clubbing, edema  Central nervous system: Alert and oriented. No focal neurological deficits. Moving extremities Skin: No rashes, lesions or  ulcers Psychiatry: Judgement and insight appear normal. Mood & affect appropriate.     Data Reviewed: I have personally reviewed following labs and imaging studies  CBC: Recent Labs  Lab 08/18/20 1420 08/19/20 0406 08/20/20 0339  WBC 17.4* 14.1* 12.6*  NEUTROABS 12.4*  --  7.4  HGB 12.2 11.2* 9.4*  HCT 37.9 36.8 29.9*  MCV 83.5 87.6 84.5  PLT 234 235 748   Basic Metabolic Panel: Recent Labs  Lab 08/18/20 1620 08/19/20 0406  08/20/20 0339  NA 133* 134* 136  K 3.3* 3.6 4.6  CL 97* 100 108  CO2 21* 20* 15*  GLUCOSE 102* 98 94  BUN 8 6 <5*  CREATININE 0.73 0.61 0.53  CALCIUM 9.0 8.3* 8.4*  MG  --   --  1.8   GFR: Estimated Creatinine Clearance: 83.5 mL/min (by C-G formula based on SCr of 0.53 mg/dL). Liver Function Tests: Recent Labs  Lab 08/18/20 1620 08/19/20 0406 08/20/20 0339  AST 31 23 23   ALT 39 37 25  ALKPHOS 83 85 73  BILITOT 2.1* 1.4* 1.3*  PROT 7.9 7.4 6.2*  ALBUMIN 3.9 3.8 3.0*   Recent Labs  Lab 08/18/20 1620  LIPASE 20   No results for input(s): AMMONIA in the last 168 hours. Coagulation Profile: No results for input(s): INR, PROTIME in the last 168 hours. Cardiac Enzymes: No results for input(s): CKTOTAL, CKMB, CKMBINDEX, TROPONINI in the last 168 hours. BNP (last 3 results) No results for input(s): PROBNP in the last 8760 hours. HbA1C: No results for input(s): HGBA1C in the last 72 hours. CBG: No results for input(s): GLUCAP in the last 168 hours. Lipid Profile: No results for input(s): CHOL, HDL, LDLCALC, TRIG, CHOLHDL, LDLDIRECT in the last 72 hours. Thyroid Function Tests: Recent Labs    08/19/20 0406  TSH 13.238*   Anemia Panel: No results for input(s): VITAMINB12, FOLATE, FERRITIN, TIBC, IRON, RETICCTPCT in the last 72 hours. Sepsis Labs: Recent Labs  Lab 08/18/20 1429 08/18/20 1934  LATICACIDVEN 1.3 0.7    Recent Results (from the past 240 hour(s))  Blood culture (routine x 2)     Status: None (Preliminary result)   Collection Time: 08/18/20  2:20 PM   Specimen: BLOOD RIGHT FOREARM  Result Value Ref Range Status   Specimen Description   Final    BLOOD RIGHT FOREARM Performed at [facility], [place] [address], [place], [place] [address]    Special Requests   Final    BOTTLES DRAWN AEROBIC ONLY Blood Culture results may not be optimal due to an inadequate volume of blood received in culture bottles Performed at [facility], [address] [place], [place] [address]    Culture   Final    NO GROWTH 2 DAYS Performed at [facility], [place] [address], [place], [place] [address]    Report Status PENDING  Incomplete  Urine culture     Status: Abnormal (Preliminary result)   Collection Time: 08/18/20  4:00 PM   Specimen: In/Out Cath Urine  Result Value Ref Range Status   Specimen Description   Final    IN/OUT CATH URINE Performed at [facility], [address] [place], [place] [address]    Special Requests   Final    NONE Performed at [facility], [address] [place], [place] [address]    Culture >=100,000 COLONIES/mL GRAM NEGATIVE RODS (A)  Final   Report Status PENDING  Incomplete  Blood culture (routine x 2)     Status: None (Preliminary result)   Collection Time: 08/18/20  4:20 PM   Specimen: BLOOD LEFT ARM  Result Value Ref Range Status   Specimen Description   Final    BLOOD LEFT ARM Performed at [facility], [address] [place], [place] [address]    Special Requests   Final    BOTTLES DRAWN AEROBIC AND ANAEROBIC Blood Culture adequate volume Performed at [facility], [address] [place], [place] [address]    Culture   Final    NO GROWTH 2 DAYS Performed at [facility], [place] [address] [place], [place] [address]    Report Status PENDING  Incomplete  Resp Panel by RT-PCR (Flu A&B, Covid) Nasopharyngeal Swab     Status: None   Collection Time: 08/18/20 10:09 PM   Specimen: Nasopharyngeal Swab; Nasopharyngeal(NP) swabs in vial transport medium  Result Value Ref Range Status   SARS Coronavirus 2 by RT PCR NEGATIVE NEGATIVE Final    Comment: (NOTE) SARS-CoV-2 target nucleic acids are NOT DETECTED.  The SARS-CoV-2 RNA is generally detectable in upper respiratory specimens during the acute phase of infection. The lowest concentration of SARS-CoV-2 viral copies this assay can detect is 138 copies/mL. A negative result does not preclude  SARS-Cov-2 infection and should not be used as the sole basis for treatment or other patient management decisions. A negative result may occur with  improper specimen collection/handling, submission of specimen other than nasopharyngeal swab, presence of viral mutation(s) within the areas targeted by this assay, and inadequate number of viral copies(<138 copies/mL). A negative result must be combined with clinical observations, patient history, and epidemiological information. The expected result is Negative.  Fact Sheet for Patients:  EntrepreneurPulse.com.au  Fact Sheet for Healthcare Providers:  IncredibleEmployment.be  This test is no t yet approved or cleared by the [place] FDA and  has been authorized for detection and/or diagnosis of SARS-CoV-2 by FDA under an Emergency Use Authorization (EUA). This EUA will remain  in effect (meaning this test can be used) for the duration of the COVID-19 declaration under Section 564(b)(1) of the Act, 21 U.S.C.section 360bbb-3(b)(1), unless the authorization is terminated  or revoked sooner.       Influenza A by PCR NEGATIVE NEGATIVE Final   Influenza B by PCR NEGATIVE NEGATIVE Final    Comment: (NOTE) The Xpert Xpress SARS-CoV-2/FLU/RSV plus assay is intended as an aid in the diagnosis of influenza from Nasopharyngeal swab specimens and should not be used as a sole basis for treatment. Nasal washings and aspirates are unacceptable for Xpert Xpress SARS-CoV-2/FLU/RSV testing.  Fact Sheet for Patients: EntrepreneurPulse.com.au  Fact Sheet for Healthcare Providers: IncredibleEmployment.be  This test is not yet approved or cleared by the [place] FDA and has been authorized for detection and/or diagnosis of SARS-CoV-2 by FDA under an Emergency Use Authorization (EUA). This EUA will remain in effect (meaning this test can be used) for the duration of  the COVID-19 declaration under Section 564(b)(1) of the Act, 21 U.S.C. section 360bbb-3(b)(1), unless the authorization is terminated or revoked.  Performed at [facility], [address], [place], [place] [address]          Radiology Studies: CT ABDOMEN PELVIS W CONTRAST  Result Date: 08/18/2020 CLINICAL DATA:  1 week history of fever with nausea vomiting and diarrhea. EXAM: CT ABDOMEN AND PELVIS WITH CONTRAST TECHNIQUE: Multidetector CT  imaging of the abdomen and pelvis was performed using the standard protocol following bolus administration of intravenous contrast. CONTRAST:  182mL OMNIPAQUE IOHEXOL 300 MG/ML  SOLN COMPARISON:  09/18/2018 FINDINGS: Lower chest: Unremarkable. Hepatobiliary: No suspicious focal abnormality within the liver parenchyma. Liver measures 18.5 cm craniocaudal length, enlarged. There is no evidence for gallstones, gallbladder wall thickening, or pericholecystic fluid. No intrahepatic or extrahepatic biliary dilation. Pancreas: No focal mass lesion. No dilatation of the main duct. No intraparenchymal cyst. No peripancreatic edema. Spleen: No splenomegaly. No focal mass lesion. Adrenals/Urinary Tract: No adrenal nodule or mass. Right kidney unremarkable. Segmental edema is noted in multiple regions of the left kidney, involving the upper pole, interpolar region, and lower pole. CT imaging features are compatible with pyelonephritis. 10 mm low-density focus in the lower pole may represent evolving phlegmon, but no discrete intrarenal abscess at this time. No evidence for hydroureter. The urinary bladder appears normal for the degree of distention. Stomach/Bowel: Stomach is unremarkable. No gastric wall thickening. No evidence of outlet obstruction. Duodenum is normally positioned as is the ligament of Treitz. No small bowel wall thickening. No small bowel dilatation. The terminal ileum is normal. No gross colonic mass. No colonic wall thickening.  Vascular/Lymphatic: No abdominal aortic aneurysm. No abdominal aortic atherosclerotic calcification. There is no gastrohepatic or hepatoduodenal ligament lymphadenopathy. No retroperitoneal or mesenteric lymphadenopathy. No pelvic sidewall lymphadenopathy. Reproductive: Marked fibroid disease noted in the uterus including a large exophytic fundal fibroid measuring 9.4 x 9.2 x 7.3 cm, similar to 09/18/2018. There is no adnexal mass. Other: No substantial intraperitoneal free fluid. Musculoskeletal: No worrisome lytic or sclerotic osseous abnormality. IMPRESSION: 1. Multiple areas of segmental edema in the left kidney with CT imaging features compatible with pyelonephritis. 10 mm low-density focus in the lower pole may represent evolving phlegmon, but no discrete rim enhancing intrarenal abscess at this time. 2. Marked fibroid disease in the uterus including a large exophytic fundal fibroid measuring up to 9.4 cm similar to 09/18/2018. 3. Hepatomegaly. Electronically Signed   By: Misty Stanley M.D.   On: 08/18/2020 20:22   DG CHEST PORT 1 VIEW  Result Date: 08/20/2020 CLINICAL DATA:  Productive cough EXAM: PORTABLE CHEST 1 VIEW COMPARISON:  08/18/2020 FINDINGS: Cardiac shadow is within normal limits. The lungs are well aerated bilaterally. New patchy opacity is noted in the right lung base consistent with developing infiltrate. No sizable effusion is seen. No bony abnormality is noted. IMPRESSION: New patchy right basilar infiltrate. Electronically Signed   By: Inez Catalina M.D.   On: 08/20/2020 10:20   DG Chest Port 1 View  Result Date: 08/18/2020 CLINICAL DATA:  Fever for a week.  Nausea, vomiting, and diarrhea. EXAM: PORTABLE CHEST 1 VIEW COMPARISON:  Jan 28, 2019 FINDINGS: The heart size and mediastinal contours are within normal limits. Both lungs are clear. The visualized skeletal structures are unremarkable. IMPRESSION: No active disease. Electronically Signed   By: Dorise Bullion III M.D   On:  08/18/2020 14:30   US Abdomen Limited RUQ (LIVER/GB)  Result Date: 08/18/2020 CLINICAL DATA:  Postprandial nausea and vomiting, diarrhea, fever for 1 week EXAM: ULTRASOUND ABDOMEN LIMITED RIGHT UPPER QUADRANT COMPARISON:  09/18/2018 FINDINGS: Gallbladder: No gallstones or wall thickening visualized. No sonographic Murphy sign noted by sonographer. Common bile duct: Diameter: 2 mm Liver: No focal lesion identified. Within normal limits in parenchymal echogenicity. Portal vein is patent on color Doppler imaging with normal direction of blood flow towards the liver. Other: None. IMPRESSION: 1. Unremarkable right upper  quadrant ultrasound. Electronically Signed   By: Randa Ngo M.D.   On: 08/18/2020 18:39        Scheduled Meds: . enoxaparin (LOVENOX) injection  40 mg Subcutaneous Q24H   Continuous Infusions: . cefTRIAXone (ROCEPHIN)  IV Stopped (08/19/20 1743)  . sodium bicarbonate (isotonic) 150 mEq in D5W 1000 mL infusion 75 mL/hr at 08/20/20 9409          Aline August, MD [facility] 08/20/2020, 10:51 AM

## 2020-08-21 ENCOUNTER — Other Ambulatory Visit (HOSPITAL_COMMUNITY): Payer: Self-pay | Admitting: Internal Medicine

## 2020-08-21 LAB — CBC WITH DIFFERENTIAL/PLATELET
Abs Immature Granulocytes: 0.33 10*3/uL — ABNORMAL HIGH (ref 0.00–0.07)
Basophils Absolute: 0 10*3/uL (ref 0.0–0.1)
Basophils Relative: 0 %
Eosinophils Absolute: 0.1 10*3/uL (ref 0.0–0.5)
Eosinophils Relative: 1 %
HCT: 28.2 % — ABNORMAL LOW (ref 36.0–46.0)
Hemoglobin: 9.3 g/dL — ABNORMAL LOW (ref 12.0–15.0)
Immature Granulocytes: 3 %
Lymphocytes Relative: 24 %
Lymphs Abs: 2.9 10*3/uL (ref 0.7–4.0)
MCH: 26.8 pg (ref 26.0–34.0)
MCHC: 33 g/dL (ref 30.0–36.0)
MCV: 81.3 fL (ref 80.0–100.0)
Monocytes Absolute: 1.2 10*3/uL — ABNORMAL HIGH (ref 0.1–1.0)
Monocytes Relative: 10 %
Neutro Abs: 7.3 10*3/uL (ref 1.7–7.7)
Neutrophils Relative %: 62 %
Platelets: 266 10*3/uL (ref 150–400)
RBC: 3.47 MIL/uL — ABNORMAL LOW (ref 3.87–5.11)
RDW: 13.7 % (ref 11.5–15.5)
WBC: 11.8 10*3/uL — ABNORMAL HIGH (ref 4.0–10.5)
nRBC: 0.2 % (ref 0.0–0.2)

## 2020-08-21 LAB — COMPREHENSIVE METABOLIC PANEL
ALT: 26 U/L (ref 0–44)
AST: 19 U/L (ref 15–41)
Albumin: 3.1 g/dL — ABNORMAL LOW (ref 3.5–5.0)
Alkaline Phosphatase: 70 U/L (ref 38–126)
Anion gap: 13 (ref 5–15)
BUN: UNDETERMINED mg/dL (ref 6–20)
CO2: 22 mmol/L (ref 22–32)
Calcium: 8.3 mg/dL — ABNORMAL LOW (ref 8.9–10.3)
Chloride: 103 mmol/L (ref 98–111)
Creatinine, Ser: UNDETERMINED mg/dL (ref 0.44–1.00)
Glucose, Bld: 116 mg/dL — ABNORMAL HIGH (ref 70–99)
Potassium: 3.4 mmol/L — ABNORMAL LOW (ref 3.5–5.1)
Sodium: 138 mmol/L (ref 135–145)
Total Bilirubin: 0.6 mg/dL (ref 0.3–1.2)
Total Protein: 6.3 g/dL — ABNORMAL LOW (ref 6.5–8.1)

## 2020-08-21 LAB — URINE CULTURE: Culture: >=100000 — AB

## 2020-08-21 LAB — MAGNESIUM: Magnesium: 1.7 mg/dL (ref 1.7–2.4)

## 2020-08-21 MED ORDER — POTASSIUM CHLORIDE CRYS ER 20 MEQ PO TBCR
40.0000 meq | EXTENDED_RELEASE_TABLET | Freq: Once | ORAL | Status: AC
  Administered 2020-08-21: 40 meq via ORAL
  Filled 2020-08-21: qty 2

## 2020-08-21 MED ORDER — ONDANSETRON HCL 4 MG PO TABS: 4.0000 mg | ORAL_TABLET | Freq: Four times a day (QID) | ORAL | 0 refills | Status: DC | PRN

## 2020-08-21 MED ORDER — IBUPROFEN 400 MG PO TABS: 400.0000 mg | ORAL_TABLET | Freq: Four times a day (QID) | ORAL | 0 refills | Status: DC | PRN

## 2020-08-21 MED ORDER — CEPHALEXIN 500 MG PO CAPS: 500.0000 mg | ORAL_CAPSULE | Freq: Four times a day (QID) | ORAL | 0 refills | Status: DC

## 2020-08-21 MED FILL — CEPHALEXIN 500 MG CAPSULE: 500 | 10 days supply | Qty: 40 | Fill #0

## 2020-08-21 MED FILL — IBUPROFEN 400 MG TABS: 400 | 8 days supply | Qty: 30 | Fill #0

## 2020-08-21 MED FILL — ONDANSETRON HCL 4 MG TABLET: 4 | 5 days supply | Qty: 20 | Fill #0

## 2020-08-21 NOTE — Plan of Care (Signed)
  Problem: Education: Goal: Knowledge of General Education information will improve Description: Including pain rating scale, medication(s)/side effects and non-pharmacologic comfort measures Outcome: Completed/Met   Problem: Health Behavior/Discharge Planning: Goal: Ability to manage health-related needs will improve Outcome: Completed/Met   Problem: Clinical Measurements: Goal: Ability to maintain clinical measurements within normal limits will improve Outcome: Completed/Met Goal: Will remain free from infection 08/21/2020 0956 by Deetta Perla, RN Outcome: Adequate for Discharge 08/21/2020 0800 by Rilee Wendling, Helane Gunther, RN Outcome: Progressing Goal: Diagnostic test results will improve 08/21/2020 0956 by Deetta Perla, RN Outcome: Adequate for Discharge 08/21/2020 0800 by Deetta Perla, RN Outcome: Progressing Goal: Respiratory complications will improve 08/21/2020 0956 by Deetta Perla, RN Outcome: Adequate for Discharge 08/21/2020 0800 by Deetta Perla, RN Outcome: Progressing Goal: Cardiovascular complication will be avoided 08/21/2020 0956 by Deetta Perla, RN Outcome: Adequate for Discharge 08/21/2020 0800 by Shirlee Whitmire, Helane Gunther, RN Outcome: Progressing   Problem: Activity: Goal: Risk for activity intolerance will decrease 08/21/2020 0956 by Deetta Perla, RN Outcome: Adequate for Discharge 08/21/2020 0800 by Ginna Schuur, Helane Gunther, RN Outcome: Progressing   Problem: Nutrition: Goal: Adequate nutrition will be maintained Outcome: Completed/Met   Problem: Elimination: Goal: Will not experience complications related to bowel motility 08/21/2020 0956 by Deetta Perla, RN Outcome: Adequate for Discharge 08/21/2020 0800 by Trevian Hayashida, Helane Gunther, RN Outcome: Progressing Goal: Will not experience complications related to urinary retention Outcome: Completed/Met   Problem: Coping: Goal: Level of anxiety will decrease 08/21/2020 0956 by Deetta Perla,  RN Outcome: Adequate for Discharge 08/21/2020 0800 by Lindsi Bayliss, Helane Gunther, RN Outcome: Progressing   Problem: Pain Managment: Goal: General experience of comfort will improve 08/21/2020 0956 by Deetta Perla, RN Outcome: Adequate for Discharge 08/21/2020 0800 by Geraldin Habermehl, Helane Gunther, RN Outcome: Progressing   Problem: Safety: Goal: Ability to remain free from injury will improve 08/21/2020 0956 by Deetta Perla, RN Outcome: Adequate for Discharge 08/21/2020 0800 by Deetta Perla, RN Outcome: Progressing   Problem: Skin Integrity: Goal: Risk for impaired skin integrity will decrease 08/21/2020 0956 by Deetta Perla, RN Outcome: Adequate for Discharge 08/21/2020 0800 by Naimah Yingst, Helane Gunther, RN Outcome: Progressing

## 2020-08-21 NOTE — Discharge Summary (Signed)
Physician Discharge Summary  WM FRUCHTER DVV:[id number] DOB: 1991-07-19 DOA: 08/18/2020  PCP: Patient, No Pcp Per  Admit date: 08/18/2020 Discharge date: 08/21/2020  Admitted From: Home Disposition: Home  Recommendations for Outpatient Follow-up:  1. Follow up in ED if symptoms worsen or new appear 2. Recommend urology evaluation as an outpatient if symptoms do not improve or were to worsen.   Home Health: No Equipment/Devices: None  Discharge Condition: Stable CODE STATUS: Full Diet recommendation: Regular  Brief/Interim Summary: 29 year old female with history of autoimmune hypothyroidism, pituitary tumor with galactorrhea presented with fever and vomiting along with increased frequency of urination.  On presentation, T bili was 2.1.  CT of the abdomen and pelvis showed left pyelonephritis with possible phlegmon at inferior pole of kidney along with hepatomegaly.  She was started on IV fluids and antibiotics.  During the hospitalization, her condition has gradually improved.  Urine culture grew E. coli.  She is currently hemodynamically stable, afebrile and tolerating diet.  She will be discharged on oral antibiotics today.  Discharge Diagnoses:   Sepsis: Present on admission Acute pyelonephritis with possible phlegmon Leukocytosis -Patient presented with fever, increased frequency of urination and was found to have elevated temperature, tachycardia with leukocytosis and pyelonephritis on CT of the abdomen -No temperature spike over  the last 24 hours. -Blood cultures negative so far.  Urine culture growing more than 100,000 colonies per mL of E. coli, pansensitive.    Currently on Rocephin.  WBC is improving.  Chest x-ray from 08/20/2020 showed patchy right basilar infiltrate but patient does not have symptoms of pneumonia so will not need treatment for pneumonia. -She is currently hemodynamically stable, afebrile and tolerating diet.  She will be discharged on oral Keflex today  for 10 more days. Recommend urology evaluation as an outpatient if symptoms do not improve or were to worsen. -Sepsis has resolved  Acute metabolic acidosis -Questionable cause.  No diarrhea.    Treated with bicarb drip.  Bicarbonate 22 today.  DC bicarb drip.  Mildly elevated bilirubin -Presented with bilirubin of 2.1.  Improved, bilirubin 0.6 today. No need for any other intervention including GI work-up  History of autoimmune thyroiditis -Outpatient follow-up with PCP.  Discharge Instructions  Discharge Instructions    Diet general   Complete by: As directed    Increase activity slowly   Complete by: As directed      Allergies as of 08/21/2020   No Known Allergies     Medication List    TAKE these medications   cephALEXin 500 MG capsule Commonly known as: KEFLEX Take 1 capsule (500 mg total) by mouth 4 (four) times daily for 10 days.   ibuprofen 400 MG tablet Commonly known as: ADVIL Take 1 tablet (400 mg total) by mouth every 6 (six) hours as needed for fever, headache or moderate pain (use first).   ondansetron 4 MG tablet Commonly known as: ZOFRAN Take 1 tablet (4 mg total) by mouth every 6 (six) hours as needed for nausea.   Sprintec 28 0.25-35 MG-MCG tablet Generic drug: norgestimate-ethinyl estradiol Take 1 tablet by mouth daily.       No Known Allergies  Consultations:  None   Procedures/Studies: CT ABDOMEN PELVIS W CONTRAST  Result Date: 08/18/2020 CLINICAL DATA:  1 week history of fever with nausea vomiting and diarrhea. EXAM: CT ABDOMEN AND PELVIS WITH CONTRAST TECHNIQUE: Multidetector CT imaging of the abdomen and pelvis was performed using the standard protocol following bolus administration of intravenous contrast. CONTRAST:  144mL OMNIPAQUE IOHEXOL 300 MG/ML  SOLN COMPARISON:  09/18/2018 FINDINGS: Lower chest: Unremarkable. Hepatobiliary: No suspicious focal abnormality within the liver parenchyma. Liver measures 18.5 cm craniocaudal length,  enlarged. There is no evidence for gallstones, gallbladder wall thickening, or pericholecystic fluid. No intrahepatic or extrahepatic biliary dilation. Pancreas: No focal mass lesion. No dilatation of the main duct. No intraparenchymal cyst. No peripancreatic edema. Spleen: No splenomegaly. No focal mass lesion. Adrenals/Urinary Tract: No adrenal nodule or mass. Right kidney unremarkable. Segmental edema is noted in multiple regions of the left kidney, involving the upper pole, interpolar region, and lower pole. CT imaging features are compatible with pyelonephritis. 10 mm low-density focus in the lower pole may represent evolving phlegmon, but no discrete intrarenal abscess at this time. No evidence for hydroureter. The urinary bladder appears normal for the degree of distention. Stomach/Bowel: Stomach is unremarkable. No gastric wall thickening. No evidence of outlet obstruction. Duodenum is normally positioned as is the ligament of Treitz. No small bowel wall thickening. No small bowel dilatation. The terminal ileum is normal. No gross colonic mass. No colonic wall thickening. Vascular/Lymphatic: No abdominal aortic aneurysm. No abdominal aortic atherosclerotic calcification. There is no gastrohepatic or hepatoduodenal ligament lymphadenopathy. No retroperitoneal or mesenteric lymphadenopathy. No pelvic sidewall lymphadenopathy. Reproductive: Marked fibroid disease noted in the uterus including a large exophytic fundal fibroid measuring 9.4 x 9.2 x 7.3 cm, similar to 09/18/2018. There is no adnexal mass. Other: No substantial intraperitoneal free fluid. Musculoskeletal: No worrisome lytic or sclerotic osseous abnormality. IMPRESSION: 1. Multiple areas of segmental edema in the left kidney with CT imaging features compatible with pyelonephritis. 10 mm low-density focus in the lower pole may represent evolving phlegmon, but no discrete rim enhancing intrarenal abscess at this time. 2. Marked fibroid disease in the  uterus including a large exophytic fundal fibroid measuring up to 9.4 cm similar to 09/18/2018. 3. Hepatomegaly. Electronically Signed   By: Misty Stanley M.D.   On: 08/18/2020 20:22   DG CHEST PORT 1 VIEW  Result Date: 08/20/2020 CLINICAL DATA:  Productive cough EXAM: PORTABLE CHEST 1 VIEW COMPARISON:  08/18/2020 FINDINGS: Cardiac shadow is within normal limits. The lungs are well aerated bilaterally. New patchy opacity is noted in the right lung base consistent with developing infiltrate. No sizable effusion is seen. No bony abnormality is noted. IMPRESSION: New patchy right basilar infiltrate. Electronically Signed   By: Inez Catalina M.D.   On: 08/20/2020 10:20   DG Chest Port 1 View  Result Date: 08/18/2020 CLINICAL DATA:  Fever for a week.  Nausea, vomiting, and diarrhea. EXAM: PORTABLE CHEST 1 VIEW COMPARISON:  Jan 28, 2019 FINDINGS: The heart size and mediastinal contours are within normal limits. Both lungs are clear. The visualized skeletal structures are unremarkable. IMPRESSION: No active disease. Electronically Signed   By: Dorise Bullion III M.D   On: 08/18/2020 14:30   US Abdomen Limited RUQ (LIVER/GB)  Result Date: 08/18/2020 CLINICAL DATA:  Postprandial nausea and vomiting, diarrhea, fever for 1 week EXAM: ULTRASOUND ABDOMEN LIMITED RIGHT UPPER QUADRANT COMPARISON:  09/18/2018 FINDINGS: Gallbladder: No gallstones or wall thickening visualized. No sonographic Murphy sign noted by sonographer. Common bile duct: Diameter: 2 mm Liver: No focal lesion identified. Within normal limits in parenchymal echogenicity. Portal vein is patent on color Doppler imaging with normal direction of blood flow towards the liver. Other: None. IMPRESSION: 1. Unremarkable right upper quadrant ultrasound. Electronically Signed   By: Randa Ngo M.D.   On: 08/18/2020 18:39  Subjective: Patient seen and examined at bedside.  She feels much better.  Her flank and lower abdominal pain have improved.   She is tolerating diet.  No overnight fever.  Feels ready to go home today.  Discharge Exam: Vitals:   08/20/20 2145 08/21/20 0505  BP: 119/78 118/76  Pulse: 97 94  Resp: 16 16  Temp: 99.2 F (37.3 C) 98.6 F (37 C)  SpO2: 99% 98%    General: Pt is alert, awake, not in acute distress Cardiovascular: rate controlled, S1/S2 + Respiratory: bilateral decreased breath sounds at bases Abdominal: Soft, NT, ND, bowel sounds + Extremities: no edema, no cyanosis    The results of significant diagnostics from this hospitalization (including imaging, microbiology, ancillary and laboratory) are listed below for reference.     Microbiology: Recent Results (from the past 240 hour(s))  Blood culture (routine x 2)     Status: None (Preliminary result)   Collection Time: 08/18/20  2:20 PM   Specimen: BLOOD RIGHT FOREARM  Result Value Ref Range Status   Specimen Description   Final    BLOOD RIGHT FOREARM Performed at [facility], [address], [place], [place] [address]    Special Requests   Final    BOTTLES DRAWN AEROBIC ONLY Blood Culture results may not be optimal due to an inadequate volume of blood received in culture bottles Performed at [facility], [address] [place], [place] [address]    Culture   Final    NO GROWTH 3 DAYS Performed at [facility], [place] [address], [place], [place] [address]    Report Status PENDING  Incomplete  Urine culture     Status: Abnormal   Collection Time: 08/18/20  4:00 PM   Specimen: In/Out Cath Urine  Result Value Ref Range Status   Specimen Description   Final    IN/OUT CATH URINE Performed at [facility], [address] [place], [place] [address]    Special Requests   Final    NONE Performed at [facility], [address] [place], [place] [address]    Culture >=100,000 COLONIES/mL ESCHERICHIA COLI (A)  Final   Report Status 08/21/2020 FINAL  Final   Organism ID, Bacteria  ESCHERICHIA COLI (A)  Final      Susceptibility   Escherichia coli - MIC*    AMPICILLIN <=2 SENSITIVE Sensitive     CEFAZOLIN <=4 SENSITIVE Sensitive     CEFEPIME <=0.12 SENSITIVE Sensitive     CEFTRIAXONE <=0.25 SENSITIVE Sensitive     CIPROFLOXACIN <=0.25 SENSITIVE Sensitive     GENTAMICIN <=1 SENSITIVE Sensitive     IMIPENEM <=0.25 SENSITIVE Sensitive     NITROFURANTOIN <=16 SENSITIVE Sensitive     TRIMETH/SULFA <=20 SENSITIVE Sensitive     AMPICILLIN/SULBACTAM <=2 SENSITIVE Sensitive     PIP/TAZO <=4 SENSITIVE Sensitive     * >=100,000 COLONIES/mL ESCHERICHIA COLI  Blood culture (routine x 2)     Status: None (Preliminary result)   Collection Time: 08/18/20  4:20 PM   Specimen: BLOOD LEFT ARM  Result Value Ref Range Status   Specimen Description   Final    BLOOD LEFT ARM Performed at [facility], [address] [place], [place] [address]    Special Requests   Final    BOTTLES DRAWN AEROBIC AND ANAEROBIC Blood Culture adequate volume Performed at [facility], [address] [place], [place]  [address]    Culture   Final    NO GROWTH 3 DAYS Performed at [facility], [place] [address] [place], [place] [address]    Report Status PENDING  Incomplete  Resp Panel by RT-PCR (Flu A&B, Covid) Nasopharyngeal Swab     Status: None   Collection Time: 08/18/20 10:09 PM   Specimen: Nasopharyngeal Swab; Nasopharyngeal(NP) swabs in vial transport medium  Result Value Ref Range Status   SARS Coronavirus 2 by RT PCR NEGATIVE NEGATIVE Final    Comment: (NOTE) SARS-CoV-2 target nucleic acids are NOT DETECTED.  The SARS-CoV-2 RNA is generally detectable in upper respiratory specimens during the acute phase of infection. The lowest concentration of SARS-CoV-2 viral copies this assay can detect is 138 copies/mL. A negative result does not preclude SARS-Cov-2 infection and should not be used as the sole basis for treatment or other patient management decisions. A  negative result may occur with  improper specimen collection/handling, submission of specimen other than nasopharyngeal swab, presence of viral mutation(s) within the areas targeted by this assay, and inadequate number of viral copies(<138 copies/mL). A negative result must be combined with clinical observations, patient history, and epidemiological information. The expected result is Negative.  Fact Sheet for Patients:  EntrepreneurPulse.com.au  Fact Sheet for Healthcare Providers:  IncredibleEmployment.be  This test is no t yet approved or cleared by the [place] FDA and  has been authorized for detection and/or diagnosis of SARS-CoV-2 by FDA under an Emergency Use Authorization (EUA). This EUA will remain  in effect (meaning this test can be used) for the duration of the COVID-19 declaration under Section 564(b)(1) of the Act, 21 U.S.C.section 360bbb-3(b)(1), unless the authorization is terminated  or revoked sooner.       Influenza A by PCR NEGATIVE NEGATIVE Final   Influenza B by PCR NEGATIVE NEGATIVE Final    Comment: (NOTE) The Xpert Xpress SARS-CoV-2/FLU/RSV plus assay is intended as an aid in the diagnosis of influenza from Nasopharyngeal swab specimens and should not be used as a sole basis for treatment. Nasal washings and aspirates are unacceptable for Xpert Xpress SARS-CoV-2/FLU/RSV testing.  Fact Sheet for Patients: EntrepreneurPulse.com.au  Fact Sheet for Healthcare Providers: IncredibleEmployment.be  This test is not yet approved or cleared by the [place] FDA and has been authorized for detection and/or diagnosis of SARS-CoV-2 by FDA under an Emergency Use Authorization (EUA). This EUA will remain in effect (meaning this test can be used) for the duration of the COVID-19 declaration under Section 564(b)(1) of the Act, 21 U.S.C. section 360bbb-3(b)(1), unless the authorization  is terminated or revoked.  Performed at [facility], [address] [place], [place] [address]      Labs: BNP (last 3 results) No results for input(s): BNP in the last 8760 hours. Basic Metabolic Panel: Recent Labs  Lab 08/18/20 1620 08/19/20 0406 08/20/20 0339 08/21/20 0322  NA 133* 134* 136 138  K 3.3* 3.6 4.6 3.4*  CL 97* 100 108 103  CO2 21* 20* 15* 22  GLUCOSE 102* 98 94 116*  BUN 8 6 <5* QUANTITY NOT SUFFICIENT, UNABLE TO PERFORM TEST  CREATININE 0.73 0.61 0.53 QUANTITY NOT SUFFICIENT, UNABLE TO PERFORM TEST  CALCIUM 9.0 8.3* 8.4* 8.3*  MG  --   --  1.8 1.7   Liver Function Tests: Recent Labs  Lab 08/18/20 1620 08/19/20 0406 08/20/20 0339 08/21/20 0322  AST 31 23 23 19   ALT 39 37 25 26  ALKPHOS 83 85  73 70  BILITOT 2.1* 1.4* 1.3* 0.6  PROT 7.9 7.4 6.2* 6.3*  ALBUMIN 3.9 3.8 3.0* 3.1*   Recent Labs  Lab 08/18/20 1620  LIPASE 20   No results for input(s): AMMONIA in the last 168 hours. CBC: Recent Labs  Lab 08/18/20 1420 08/19/20 0406 08/20/20 0339 08/21/20 0322  WBC 17.4* 14.1* 12.6* 11.8*  NEUTROABS 12.4*  --  7.4 7.3  HGB 12.2 11.2* 9.4* 9.3*  HCT 37.9 36.8 29.9* 28.2*  MCV 83.5 87.6 84.5 81.3  PLT 234 235 230 266   Cardiac Enzymes: No results for input(s): CKTOTAL, CKMB, CKMBINDEX, TROPONINI in the last 168 hours. BNP: Invalid input(s): POCBNP CBG: No results for input(s): GLUCAP in the last 168 hours. D-Dimer No results for input(s): DDIMER in the last 72 hours. Hgb A1c No results for input(s): HGBA1C in the last 72 hours. Lipid Profile No results for input(s): CHOL, HDL, LDLCALC, TRIG, CHOLHDL, LDLDIRECT in the last 72 hours. Thyroid function studies Recent Labs    08/19/20 0406  TSH 13.238*   Anemia work up No results for input(s): VITAMINB12, FOLATE, FERRITIN, TIBC, IRON, RETICCTPCT in the last 72 hours. Urinalysis    Component Value Date/Time   COLORURINE ORANGE (A) 08/18/2020 1600   APPEARANCEUR HAZY (A)  08/18/2020 1600   LABSPEC 1.025 08/18/2020 1600   PHURINE 6.0 08/18/2020 1600   GLUCOSEU NEGATIVE 08/18/2020 1600   HGBUR SMALL (A) 08/18/2020 1600   BILIRUBINUR MODERATE (A) 08/18/2020 1600   KETONESUR >80 (A) 08/18/2020 1600   PROTEINUR 100 (A) 08/18/2020 1600   UROBILINOGEN 1.0 04/25/2013 1432   NITRITE NEGATIVE 08/18/2020 1600   LEUKOCYTESUR TRACE (A) 08/18/2020 1600   Sepsis Labs Invalid input(s): PROCALCITONIN,  WBC,  LACTICIDVEN Microbiology Recent Results (from the past 240 hour(s))  Blood culture (routine x 2)     Status: None (Preliminary result)   Collection Time: 08/18/20  2:20 PM   Specimen: BLOOD RIGHT FOREARM  Result Value Ref Range Status   Specimen Description   Final    BLOOD RIGHT FOREARM Performed at [facility], [place] [address] [place], [place] [address]    Special Requests   Final    BOTTLES DRAWN AEROBIC ONLY Blood Culture results may not be optimal due to an inadequate volume of blood received in culture bottles Performed at [facility], [address] [place], [place] [address]    Culture   Final    NO GROWTH 3 DAYS Performed at [facility], [place] [address] [place], [place] [address]    Report Status PENDING  Incomplete  Urine culture     Status: Abnormal   Collection Time: 08/18/20  4:00 PM   Specimen: In/Out Cath Urine  Result Value Ref Range Status   Specimen Description   Final    IN/OUT CATH URINE Performed at [facility], [address] [place],  [address]    Special Requests   Final    NONE Performed at [facility], [address] [place], [place] [address]    Culture >=100,000 COLONIES/mL ESCHERICHIA COLI (A)  Final   Report Status 08/21/2020 FINAL  Final   Organism ID, Bacteria ESCHERICHIA COLI (A)  Final      Susceptibility   Escherichia coli - MIC*    AMPICILLIN <=2 SENSITIVE Sensitive     CEFAZOLIN <=4 SENSITIVE Sensitive     CEFEPIME <=0.12 SENSITIVE Sensitive      CEFTRIAXONE <=0.25  SENSITIVE Sensitive     CIPROFLOXACIN <=0.25 SENSITIVE Sensitive     GENTAMICIN <=1 SENSITIVE Sensitive     IMIPENEM <=0.25 SENSITIVE Sensitive     NITROFURANTOIN <=16 SENSITIVE Sensitive     TRIMETH/SULFA <=20 SENSITIVE Sensitive     AMPICILLIN/SULBACTAM <=2 SENSITIVE Sensitive     PIP/TAZO <=4 SENSITIVE Sensitive     * >=100,000 COLONIES/mL ESCHERICHIA COLI  Blood culture (routine x 2)     Status: None (Preliminary result)   Collection Time: 08/18/20  4:20 PM   Specimen: BLOOD LEFT ARM  Result Value Ref Range Status   Specimen Description   Final    BLOOD LEFT ARM Performed at [facility], [address] [place], [place] [address]    Special Requests   Final    BOTTLES DRAWN AEROBIC AND ANAEROBIC Blood Culture adequate volume Performed at [facility], [address] [place], [place] [address]    Culture   Final    NO GROWTH 3 DAYS Performed at [facility], [place] [address] [place], [place] [address]    Report Status PENDING  Incomplete  Resp Panel by RT-PCR (Flu A&B, Covid) Nasopharyngeal Swab     Status: None   Collection Time: 08/18/20 10:09 PM   Specimen: Nasopharyngeal Swab; Nasopharyngeal(NP) swabs in vial transport medium  Result Value Ref Range Status   SARS Coronavirus 2 by RT PCR NEGATIVE NEGATIVE Final    Comment: (NOTE) SARS-CoV-2 target nucleic acids are NOT DETECTED.  The SARS-CoV-2 RNA is generally detectable in upper respiratory specimens during the acute phase of infection. The lowest concentration of SARS-CoV-2 viral copies this assay can detect is 138 copies/mL. A negative result does not preclude SARS-Cov-2 infection and should not be used as the sole basis for treatment or other patient management decisions. A negative result may occur with  improper specimen collection/handling, submission of specimen other than nasopharyngeal swab, presence of viral mutation(s) within the areas targeted by this assay,  and inadequate number of viral copies(<138 copies/mL). A negative result must be combined with clinical observations, patient history, and epidemiological information. The expected result is Negative.  Fact Sheet for Patients:  EntrepreneurPulse.com.au  Fact Sheet for Healthcare Providers:  IncredibleEmployment.be  This test is no t yet approved or cleared by the [place] FDA and  has been authorized for detection and/or diagnosis of SARS-CoV-2 by FDA under an Emergency Use Authorization (EUA). This EUA will remain  in effect (meaning this test can be used) for the duration of the COVID-19 declaration under Section 564(b)(1) of the Act, 21 U.S.C.section 360bbb-3(b)(1), unless the authorization is terminated  or revoked sooner.       Influenza A by PCR NEGATIVE NEGATIVE Final   Influenza B by PCR NEGATIVE NEGATIVE Final    Comment: (NOTE) The Xpert Xpress SARS-CoV-2/FLU/RSV plus assay is intended as an aid in the diagnosis of influenza from Nasopharyngeal swab specimens and should not be used as a sole basis for treatment. Nasal washings and aspirates are unacceptable for Xpert Xpress SARS-CoV-2/FLU/RSV testing.  Fact Sheet for Patients: EntrepreneurPulse.com.au  Fact Sheet for Healthcare Providers: IncredibleEmployment.be  This test is not yet approved or cleared by the [place] FDA and has been authorized for detection and/or diagnosis of SARS-CoV-2 by FDA under an Emergency Use Authorization (EUA). This EUA will remain in effect (meaning this test can be used) for the duration of the COVID-19 declaration under Section 564(b)(1) of the Act, 21 U.S.C. section 360bbb-3(b)(1),  unless the authorization is terminated or revoked.  Performed at [facility] [address] [place], [place] [address]      Time coordinating discharge: 35 minutes  SIGNED:   Aline August,  MD  [facility] 08/21/2020, 9:54 AM

## 2020-08-21 NOTE — Plan of Care (Signed)
  Problem: Education: Goal: Knowledge of General Education information will improve Description: Including pain rating scale, medication(s)/side effects and non-pharmacologic comfort measures Outcome: Completed/Met   Problem: Health Behavior/Discharge Planning: Goal: Ability to manage health-related needs will improve Outcome: Completed/Met   Problem: Clinical Measurements: Goal: Ability to maintain clinical measurements within normal limits will improve Outcome: Completed/Met Goal: Will remain free from infection Outcome: Progressing Goal: Diagnostic test results will improve Outcome: Progressing Goal: Respiratory complications will improve Outcome: Progressing Goal: Cardiovascular complication will be avoided Outcome: Progressing   Problem: Activity: Goal: Risk for activity intolerance will decrease Outcome: Progressing   Problem: Nutrition: Goal: Adequate nutrition will be maintained Outcome: Completed/Met   Problem: Coping: Goal: Level of anxiety will decrease Outcome: Progressing   Problem: Elimination: Goal: Will not experience complications related to bowel motility Outcome: Progressing Goal: Will not experience complications related to urinary retention Outcome: Completed/Met   Problem: Pain Managment: Goal: General experience of comfort will improve Outcome: Progressing   Problem: Safety: Goal: Ability to remain free from injury will improve Outcome: Progressing   Problem: Skin Integrity: Goal: Risk for impaired skin integrity will decrease Outcome: Progressing

## 2020-08-23 LAB — CULTURE, BLOOD (ROUTINE X 2)
Culture: NO GROWTH
Culture: NO GROWTH
Special Requests: ADEQUATE

## 2021-09-01 ENCOUNTER — Telehealth: Payer: Self-pay | Admitting: Nurse Practitioner

## 2021-09-01 DIAGNOSIS — R112 Nausea with vomiting, unspecified: Secondary | ICD-10-CM

## 2021-09-01 MED ORDER — ONDANSETRON HCL 4 MG PO TABS: 4.0000 mg | ORAL_TABLET | Freq: Three times a day (TID) | ORAL | 0 refills | Status: DC | PRN

## 2021-09-01 NOTE — Progress Notes (Signed)
E-Visit for Nausea and Vomiting   We are sorry that you are not feeling well. Here is how we plan to help!  Based on what you have shared with me it looks like you have a Virus that is irritating your GI tract.  Vomiting is the forceful emptying of a portion of the stomach's content through the mouth.  Although nausea and vomiting can make you feel miserable, it's important to remember that these are not diseases, but rather symptoms of an underlying illness.  When we treat short term symptoms, we always caution that any symptoms that persist should be fully evaluated in a medical office.  I have prescribed a medication that will help alleviate your symptoms and allow you to stay hydrated:  Zofran 4 mg 1 tablet every 8 hours as needed for nausea and vomiting  You can use imodium AD OTC for diarrhea iyou have or develop it.  HOME CARE: Drink clear liquids.  This is very important! Dehydration (the lack of fluid) can lead to a serious complication.  Start off with 1 tablespoon every 5 minutes for 8 hours. You may begin eating bland foods after 8 hours without vomiting.  Start with saltine crackers, white bread, rice, mashed potatoes, applesauce. After 48 hours on a bland diet, you may resume a normal diet. Try to go to sleep.  Sleep often empties the stomach and relieves the need to vomit.  GET HELP RIGHT AWAY IF:  Your symptoms do not improve or worsen within 2 days after treatment. You have a fever for over 3 days. You cannot keep down fluids after trying the medication.  MAKE SURE YOU:  Understand these instructions. Will watch your condition. Will get help right away if you are not doing well or get worse.    Thank you for choosing an e-visit.  Your e-visit answers were reviewed by a board certified advanced clinical practitioner to complete your personal care plan. Depending upon the condition, your plan could have included both over the counter or prescription  medications.  Please review your pharmacy choice. Make sure the pharmacy is open so you can pick up prescription now. If there is a problem, you may contact your provider through [employer] and have the prescription routed to another pharmacy.  Your safety is important to [place]. If you have drug allergies check your prescription carefully.   For the next 24 hours you can use MyChart to ask questions about today's visit, request a non-urgent call back, or ask for a work or school excuse. You will get an email in the next two days asking about your experience. I hope that your e-visit has been valuable and will speed your recovery.  5-10 minutes spent reviewing and documenting in chart.

## 2021-12-31 ENCOUNTER — Telehealth: Payer: Self-pay | Admitting: Physician Assistant

## 2021-12-31 DIAGNOSIS — A084 Viral intestinal infection, unspecified: Secondary | ICD-10-CM

## 2021-12-31 MED ORDER — ONDANSETRON HCL 4 MG PO TABS: 4.0000 mg | ORAL_TABLET | Freq: Three times a day (TID) | ORAL | 0 refills | Status: DC | PRN

## 2021-12-31 NOTE — Progress Notes (Signed)
E-Visit for Nausea and Vomiting  ? ?We are sorry that you are not feeling well. Here is how we plan to help! ? ?Based on what you have shared with me it looks like you have a Virus that is irritating your GI tract.  Vomiting is the forceful emptying of a portion of the stomach's content through the mouth.  Although nausea and vomiting can make you feel miserable, it's important to remember that these are not diseases, but rather symptoms of an underlying illness.  When we treat short term symptoms, we always caution that any symptoms that persist should be fully evaluated in a medical office. ? ?I have prescribed a medication that will help alleviate your symptoms and allow you to stay hydrated: ? ?Zofran 4 mg 1 tablet every 8 hours as needed for nausea and vomiting ? ?Can get Imodium OTC for diarrhea, if needed. ? ?HOME CARE: ?Drink clear liquids.  This is very important! Dehydration (the lack of fluid) can lead to a serious complication.  Start off with 1 tablespoon every 5 minutes for 8 hours. ?You may begin eating bland foods after 8 hours without vomiting.  Start with saltine crackers, white bread, rice, mashed potatoes, applesauce. ?After 48 hours on a bland diet, you may resume a normal diet. ?Try to go to sleep.  Sleep often empties the stomach and relieves the need to vomit. ? ?GET HELP RIGHT AWAY IF: ? ?Your symptoms do not improve or worsen within 2 days after treatment. ?You have a fever for over 3 days. ?You cannot keep down fluids after trying the medication. ? ?MAKE SURE YOU: ? ?Understand these instructions. ?Will watch your condition. ?Will get help right away if you are not doing well or get worse. ? ?  ?Thank you for choosing an e-visit. ? ?Your e-visit answers were reviewed by a board certified advanced clinical practitioner to complete your personal care plan. Depending upon the condition, your plan could have included both over the counter or prescription medications. ? ?Please review your  pharmacy choice. Make sure the pharmacy is open so you can pick up prescription now. If there is a problem, you may contact your provider through [employer] and have the prescription routed to another pharmacy.  Your safety is important to [place]. If you have drug allergies check your prescription carefully.  ? ?For the next 24 hours you can use MyChart to ask questions about today's visit, request a non-urgent call back, or ask for a work or school excuse. ?You will get an email in the next two days asking about your experience. I hope that your e-visit has been valuable and will speed your recovery. ? ?I provided 5 minutes of non face-to-face time during this encounter for chart review and documentation.  ? ?

## 2022-01-28 IMAGING — CT CT ABD-PELV W/ CM
2 of 4 series · 15 of 46 positions shown, 17 images · IV contrast (Omnipaque)
Comparison: 09/18/2018

CLINICAL DATA: 1 week history of fever with nausea vomiting and
diarrhea.

EXAM:
CT ABDOMEN AND PELVIS WITH CONTRAST
TECHNIQUE: Multidetector CT imaging of the abdomen and pelvis was performed
using the standard protocol following bolus administration of
intravenous contrast.
CONTRAST:  100mL OMNIPAQUE IOHEXOL 300 MG/ML  SOLN

[Series 2: axial st · axial · 0.68mm/px · z∈[+552,+922]mm · 12 of 82 slices shown, 14 images]
[im 4/82  soft-tissue]
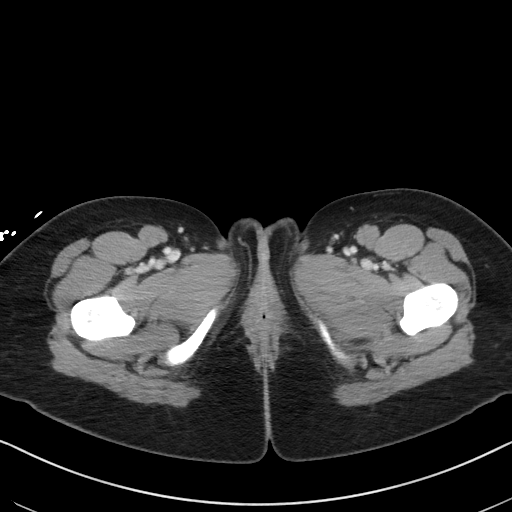
[im 4/82  bone]
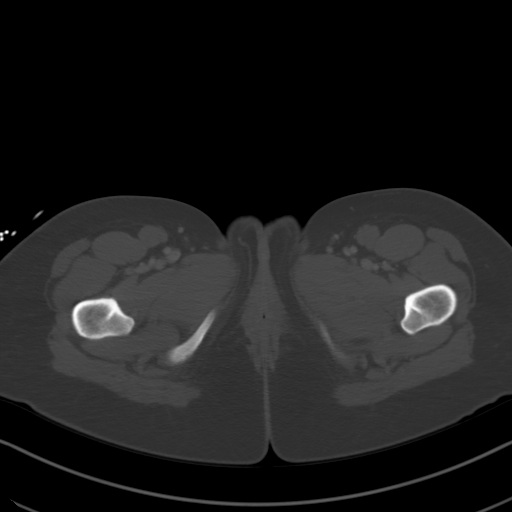
[im 11/82  soft-tissue]
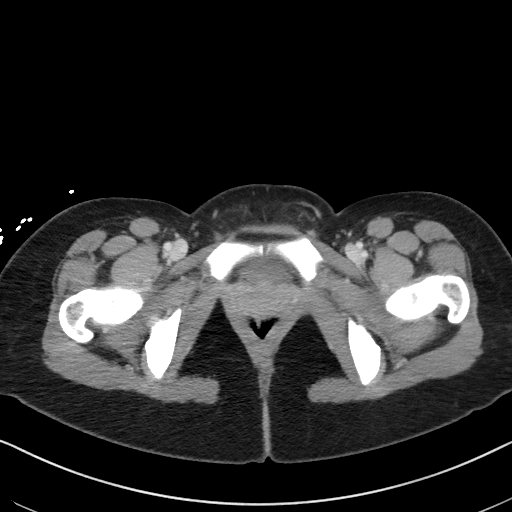
[im 17/82  soft-tissue]
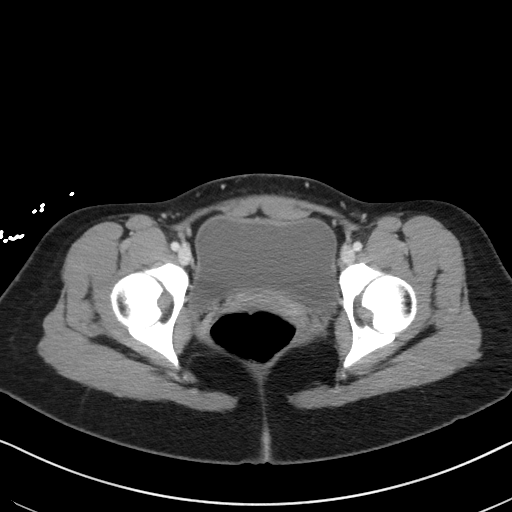
[im 24/82  soft-tissue]
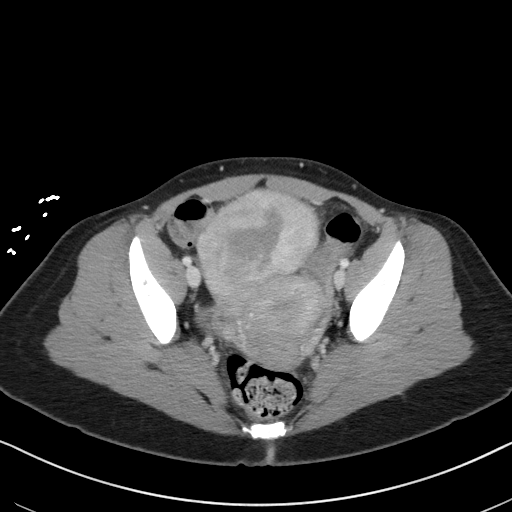
[im 31/82  soft-tissue]
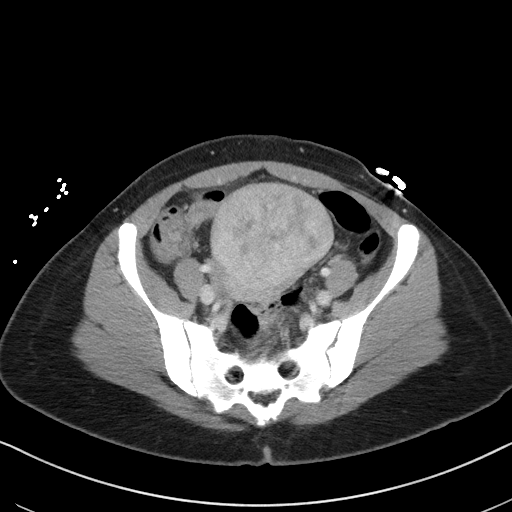
[im 38/82  soft-tissue]
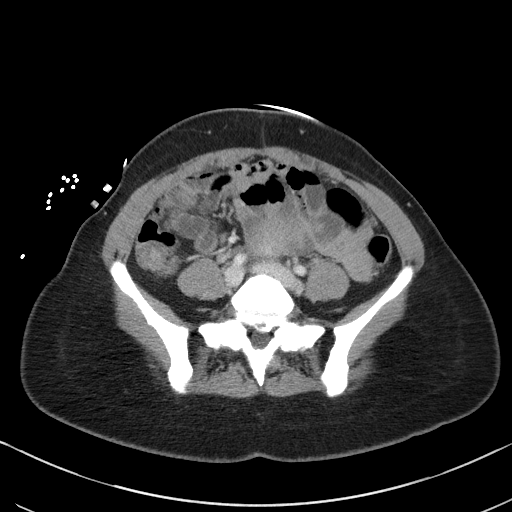
[im 44/82  soft-tissue]
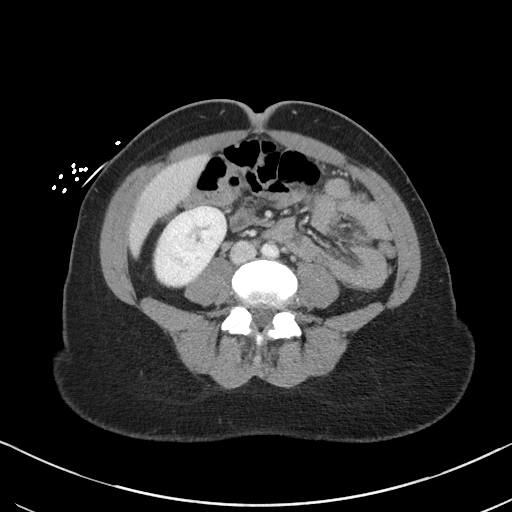
[im 51/82  soft-tissue]
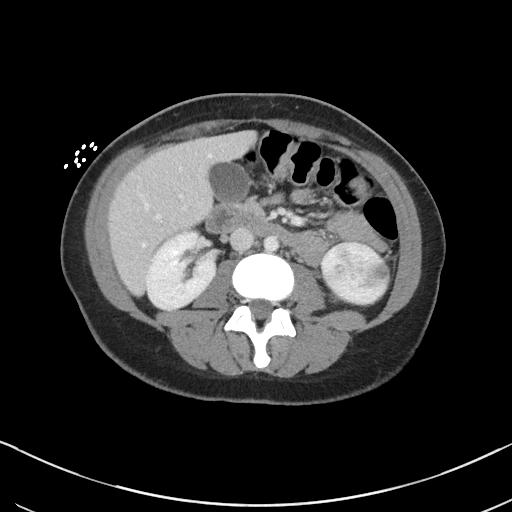
[im 58/82  soft-tissue]
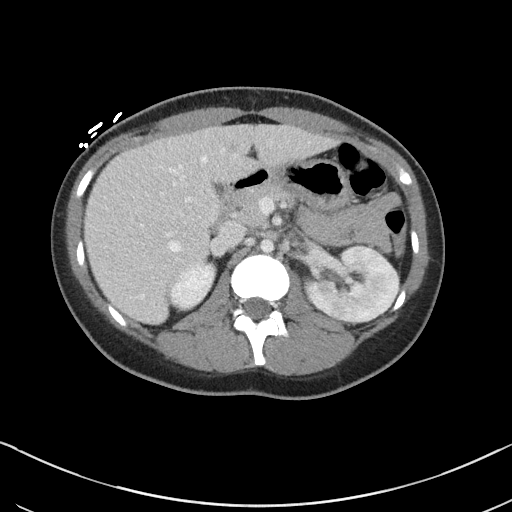
[im 58/82  bone]
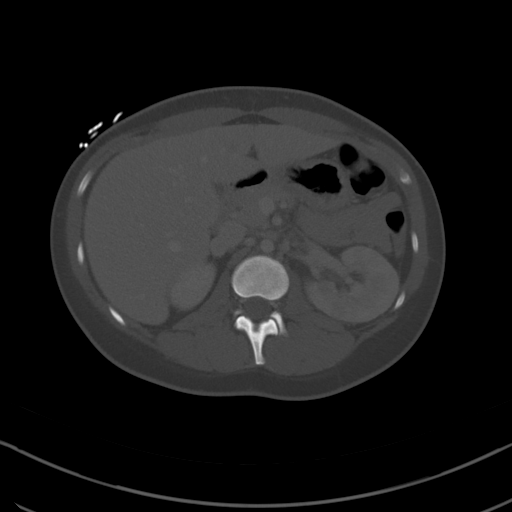
[im 65/82  soft-tissue]
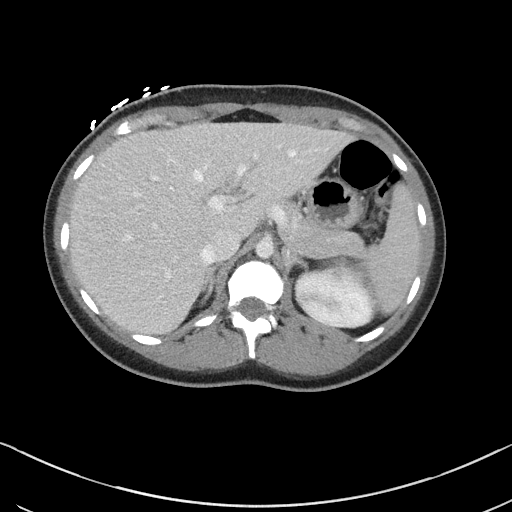
[im 71/82  soft-tissue]
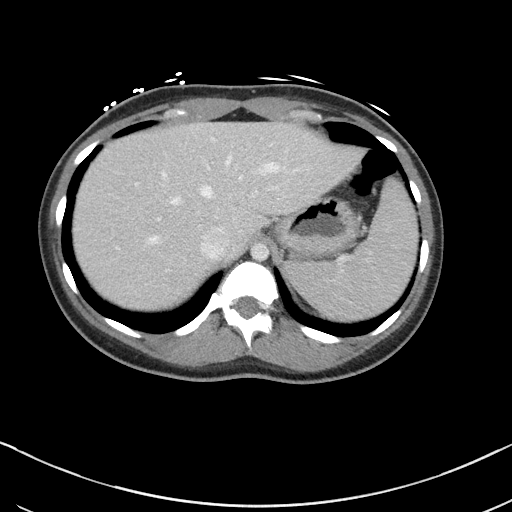
[im 78/82  soft-tissue]
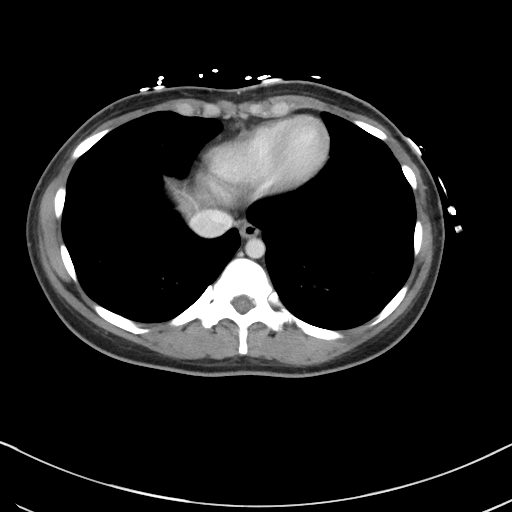

[Series 5: coronal st · coronal · 0.68mm/px · 3 of 92 slices shown]
[im 31/92  soft-tissue]
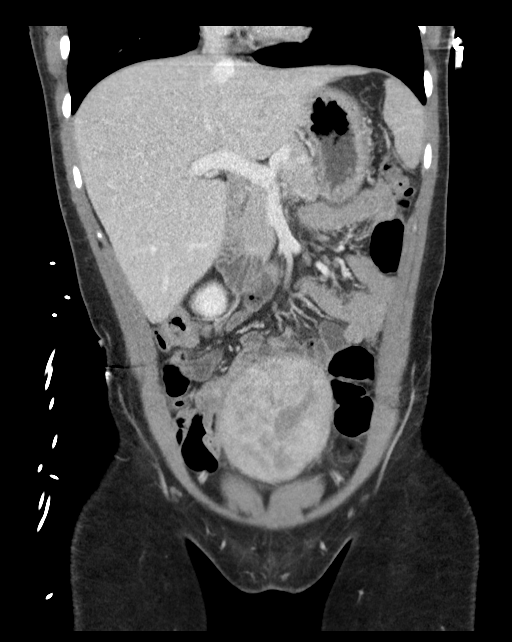
[im 41/92  soft-tissue]
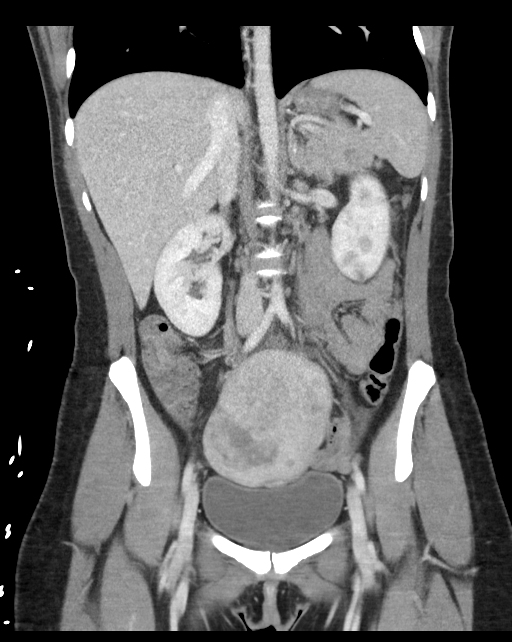
[im 51/92  soft-tissue]
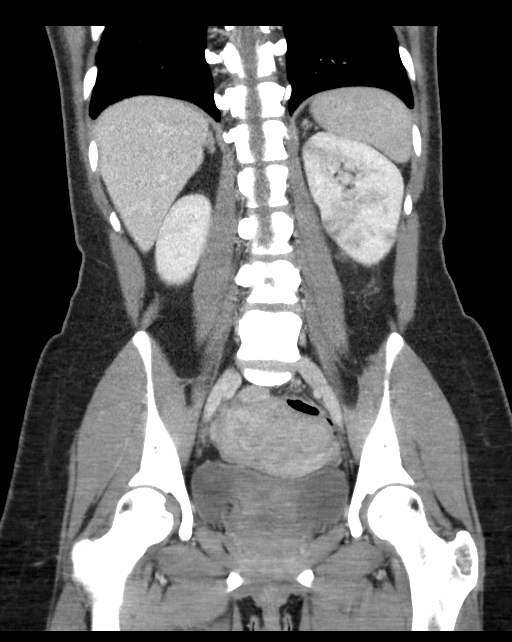

[15 of 46 positions shown; findings below may reference images not displayed]

FINDINGS: Lower chest: Unremarkable.

Hepatobiliary: No suspicious focal abnormality within the liver
parenchyma. Liver measures 18.5 cm craniocaudal length, enlarged.
There is no evidence for gallstones, gallbladder wall thickening, or
pericholecystic fluid. No intrahepatic or extrahepatic biliary
dilation.

Pancreas: No focal mass lesion. No dilatation of the main duct. No
intraparenchymal cyst. No peripancreatic edema.

Spleen: No splenomegaly. No focal mass lesion.

Adrenals/Urinary Tract: No adrenal nodule or mass. Right kidney
unremarkable. Segmental edema is noted in multiple regions of the
left kidney, involving the upper pole, interpolar region, and lower
pole. CT imaging features are compatible with pyelonephritis. 10 mm
low-density focus in the lower pole may represent evolving phlegmon,
but no discrete intrarenal abscess at this time. No evidence for
hydroureter. The urinary bladder appears normal for the degree of
distention.

Stomach/Bowel: Stomach is unremarkable. No gastric wall thickening.
No evidence of outlet obstruction. Duodenum is normally positioned
as is the ligament of Treitz. No small bowel wall thickening. No
small bowel dilatation. The terminal ileum is normal. No gross
colonic mass. No colonic wall thickening.

Vascular/Lymphatic: No abdominal aortic aneurysm. No abdominal
aortic atherosclerotic calcification. There is no gastrohepatic or
hepatoduodenal ligament lymphadenopathy. No retroperitoneal or
mesenteric lymphadenopathy. No pelvic sidewall lymphadenopathy.

Reproductive: Marked fibroid disease noted in the uterus including a
large exophytic fundal fibroid measuring 9.4 x 9.2 x 7.3 cm, similar
to 09/18/2018. There is no adnexal mass.

Other: No substantial intraperitoneal free fluid.

Musculoskeletal: No worrisome lytic or sclerotic osseous
abnormality.
IMPRESSION: 1. Multiple areas of segmental edema in the left kidney with CT
imaging features compatible with pyelonephritis. 10 mm low-density
focus in the lower pole may represent evolving phlegmon, but no
discrete rim enhancing intrarenal abscess at this time.
2. Marked fibroid disease in the uterus including a large exophytic
fundal fibroid measuring up to 9.4 cm similar to 09/18/2018.
3. Hepatomegaly.

## 2022-01-28 IMAGING — DX DG CHEST 1V PORT
1 series · 1 of 1 positions shown · non-contrast
Comparison: January 28, 2019

CLINICAL DATA: Fever for a week.  Nausea, vomiting, and diarrhea.

EXAM:
PORTABLE CHEST 1 VIEW

[chest ap]
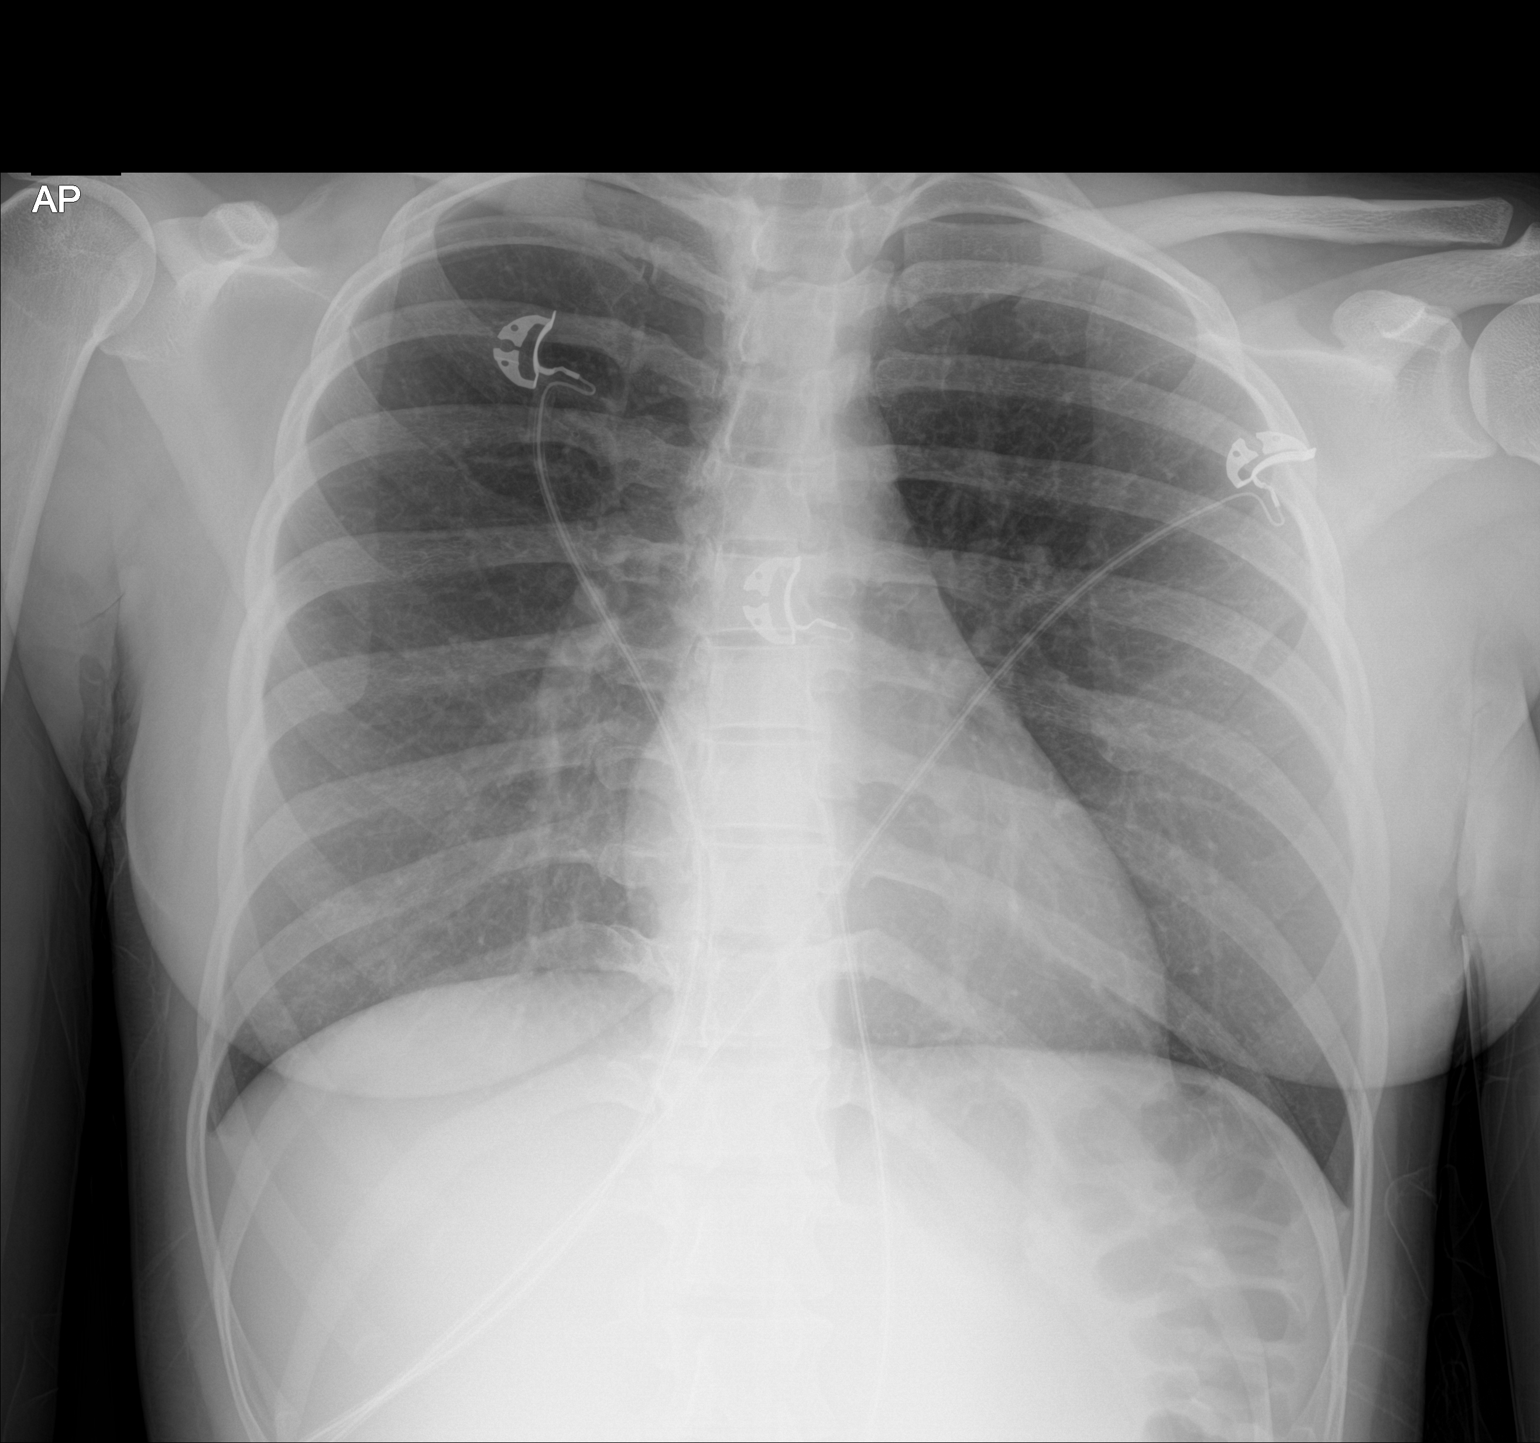

[1 of 1 positions shown; findings below may reference images not displayed]

FINDINGS: The heart size and mediastinal contours are within normal limits.
Both lungs are clear. The visualized skeletal structures are
unremarkable.
IMPRESSION: No active disease.

## 2022-05-24 ENCOUNTER — Telehealth: Payer: Self-pay | Admitting: Physician Assistant

## 2022-05-24 DIAGNOSIS — R12 Heartburn: Secondary | ICD-10-CM

## 2022-05-24 DIAGNOSIS — Z3200 Encounter for pregnancy test, result unknown: Secondary | ICD-10-CM

## 2022-05-24 DIAGNOSIS — R112 Nausea with vomiting, unspecified: Secondary | ICD-10-CM

## 2022-05-24 MED ORDER — OMEPRAZOLE 40 MG PO CPDR: 40.0000 mg | DELAYED_RELEASE_CAPSULE | Freq: Every day | ORAL | 0 refills | Status: DC

## 2022-05-24 MED ORDER — ONDANSETRON HCL 4 MG PO TABS: 4.0000 mg | ORAL_TABLET | Freq: Three times a day (TID) | ORAL | 0 refills | Status: DC | PRN

## 2022-05-24 NOTE — Progress Notes (Signed)
Because of a possible pregnancy, I feel your condition warrants further evaluation and I recommend that you be seen in a face to face visit to confirm or rule out pregnancy.    NOTE: There will be NO CHARGE for this eVisit   If you are having a true medical emergency please call 911.      For an urgent face to face visit, [facility] has seven urgent care centers for your convenience:     [facility] [place] [facility] [place] Get Driving Directions [phone] [place], [place] [address]    [facility]) Get Driving Directions [phone] [address], [place] [address]  [facility] ([place]) Get Driving Directions [phone] [address] [place],  [place]  [address]  [facility] at [address] Get Driving Directions  [phone] [address] [place],  [place] [address]   [facility] Get Driving Directions [phone] [place], [place], [place] [address]   [facility] Get Driving Directions  [phone] [address], [place] [address]   [facility] [place] Get Driving Directions [phone] [address] [place],  [address]  Your MyChart E-visit questionnaire answers were reviewed by a board certified advanced clinical practitioner to complete your personal care plan based on your specific symptoms.  Thank you for using e-Visits.    I provided 5 minutes of non face-to-face time during this encounter for chart review and documentation.

## 2022-05-24 NOTE — Progress Notes (Signed)
E-Visit for Heartburn  We are sorry that you are not feeling well.  Here is how we plan to help!  Based on what you shared with me it looks like you most likely have Gastroesophageal Reflux Disease (GERD)  Gastroesophageal reflux disease (GERD) happens when acid from your stomach flows up into the esophagus.  When acid comes in contact with the esophagus, the acid causes sorenss (inflammation) in the esophagus.  Over time, GERD may create small holes (ulcers) in the lining of the esophagus.  I have prescribed Omeprazole 40 mg one by mouth daily x 14 days. I have also prescribed Zofran 4 mg Take 1 every 8 hours as needed for nausea and vomiting.   Your symptoms should improve in the next day or two.  You can use antacids as needed until symptoms resolve.  Call us if your heartburn worsens, you have trouble swallowing, weight loss, spitting up blood or recurrent vomiting.  Home Care: May include lifestyle changes such as weight loss, quitting smoking and alcohol consumption Avoid foods and drinks that make your symptoms worse, such as: Caffeine or alcoholic drinks Chocolate Peppermint or mint flavorings Garlic and onions Spicy foods Citrus fruits, such as oranges, lemons, or limes Tomato-based foods such as sauce, chili, salsa and pizza Fried and fatty foods Avoid lying down for 3 hours prior to your bedtime or prior to taking a nap Eat small, frequent meals instead of a large meals Wear loose-fitting clothing.  Do not wear anything tight around your waist that causes pressure on your stomach. Raise the head of your bed 6 to 8 inches with wood blocks to help you sleep.  Extra pillows will not help.  Seek Help Right Away If: You have pain in your arms, neck, jaw, teeth or back Your pain increases or changes in intensity or duration You develop nausea, vomiting or sweating (diaphoresis) You develop shortness of breath or you faint Your vomit is green, yellow, black or looks like coffee  grounds or blood Your stool is red, bloody or black  These symptoms could be signs of other problems, such as heart disease, gastric bleeding or esophageal bleeding.  Make sure you : Understand these instructions. Will watch your condition. Will get help right away if you are not doing well or get worse.  Your e-visit answers were reviewed by a board certified advanced clinical practitioner to complete your personal care plan.  Depending on the condition, your plan could have included both over the counter or prescription medications.  If there is a problem please reply  once you have received a response from your provider.  Your safety is important to [place].  If you have drug allergies check your prescription carefully.    You can use MyChart to ask questions about today's visit, request a non-urgent call back, or ask for a work or school excuse for 24 hours related to this e-Visit. If it has been greater than 24 hours you will need to follow up with your provider, or enter a new e-Visit to address those concerns.  You will get an e-mail in the next two days asking about your experience.  I hope that your e-visit has been valuable and will speed your recovery. Thank you for using e-visits.  I provided 5 minutes of non face-to-face time during this encounter for chart review and documentation.

## 2022-06-05 ENCOUNTER — Telehealth: Payer: Self-pay | Admitting: Physician Assistant

## 2022-06-05 DIAGNOSIS — J069 Acute upper respiratory infection, unspecified: Secondary | ICD-10-CM

## 2022-06-05 MED ORDER — BENZONATATE 100 MG PO CAPS: 100.0000 mg | ORAL_CAPSULE | Freq: Three times a day (TID) | ORAL | 0 refills | Status: DC | PRN

## 2022-06-05 MED ORDER — PSEUDOEPH-BROMPHEN-DM 30-2-10 MG/5ML PO SYRP: 5.0000 mL | ORAL_SOLUTION | Freq: Four times a day (QID) | ORAL | 0 refills | Status: DC | PRN

## 2022-06-05 MED ORDER — IPRATROPIUM BROMIDE 0.03 % NA SOLN: 2.0000 | Freq: Two times a day (BID) | NASAL | 0 refills | Status: DC

## 2022-06-05 MED ORDER — ALBUTEROL SULFATE HFA 108 (90 BASE) MCG/ACT IN AERS: 1.0000 | INHALATION_SPRAY | Freq: Four times a day (QID) | RESPIRATORY_TRACT | 0 refills | Status: DC | PRN

## 2022-06-05 NOTE — Progress Notes (Signed)
E-Visit for Upper Respiratory Infection   We are sorry you are not feeling well.  Here is how we plan to help!  Based on what you have shared with me, it looks like you may have a viral upper respiratory infection.  Upper respiratory infections are caused by a large number of viruses; however, rhinovirus is the most common cause.   Symptoms vary from person to person, with common symptoms including sore throat, cough, fatigue or lack of energy and feeling of general discomfort.  A low-grade fever of up to 100.4 may present, but is often uncommon.  Symptoms vary however, and are closely related to a person's age or underlying illnesses.  The most common symptoms associated with an upper respiratory infection are nasal discharge or congestion, cough, sneezing, headache and pressure in the ears and face.  These symptoms usually persist for about 3 to 10 days, but can last up to 2 weeks.  It is important to know that upper respiratory infections do not cause serious illness or complications in most cases.    Upper respiratory infections can be transmitted from person to person, with the most common method of transmission being a person's hands.  The virus is able to live on the skin and can infect other persons for up to 2 hours after direct contact.  Also, these can be transmitted when someone coughs or sneezes; thus, it is important to cover the mouth to reduce this risk.  To keep the spread of the illness at [place], good hand hygiene is very important.  This is an infection that is most likely caused by a virus. There are no specific treatments other than to help you with the symptoms until the infection runs its course.  We are sorry you are not feeling well.  Here is how we plan to help!   For nasal congestion, you may use an oral decongestants such as Mucinex D or if you have glaucoma or high blood pressure use plain Mucinex.  Saline nasal spray or nasal drops can help and can safely be used as often as  needed for congestion.  For your congestion, I have prescribed Ipratropium Bromide nasal spray 0.03% two sprays in each nostril 2-3 times a day  If you do not have a history of heart disease, hypertension, diabetes or thyroid disease, prostate/bladder issues or glaucoma, you may also use Sudafed to treat nasal congestion.  It is highly recommended that you consult with a pharmacist or your primary care physician to ensure this medication is safe for you to take.     If you have a cough, you may use cough suppressants such as Delsym and Robitussin.  If you have glaucoma or high blood pressure, you can also use Coricidin HBP.   For cough I have prescribed for you A prescription cough medication called Tessalon Perles 100 mg. You may take 1-2 capsules every 8 hours as needed for cough and Bromfed DM Take 57m every 6 hours as needed for cough. Can use with Tessalon (Benzonatate) or can alternate the two.   I have also prescribed an albuterol inhaler Use 1-2 puffs every 6 hours as needed for shortness of breath or wheezing.   If you have a sore or scratchy throat, use a saltwater gargle-  to  teaspoon of salt dissolved in a 4-ounce to 8-ounce glass of warm water.  Gargle the solution for approximately 15-30 seconds and then spit.  It is important not to swallow the solution.  You  can also use throat lozenges/cough drops and Chloraseptic spray to help with throat pain or discomfort.  Warm or cold liquids can also be helpful in relieving throat pain.  For headache, pain or general discomfort, you can use Ibuprofen or Tylenol as directed.   Some authorities believe that zinc sprays or the use of Echinacea may shorten the course of your symptoms.   HOME CARE Only take medications as instructed by your medical team. Be sure to drink plenty of fluids. Water is fine as well as fruit juices, sodas and electrolyte beverages. You may want to stay away from caffeine or alcohol. If you are nauseated, try taking  small sips of liquids. How do you know if you are getting enough fluid? Your urine should be a pale yellow or almost colorless. Get rest. Taking a steamy shower or using a humidifier may help nasal congestion and ease sore throat pain. You can place a towel over your head and breathe in the steam from hot water coming from a faucet. Using a saline nasal spray works much the same way. Cough drops, hard candies and sore throat lozenges may ease your cough. Avoid close contacts especially the very young and the elderly Cover your mouth if you cough or sneeze Always remember to wash your hands.   GET HELP RIGHT AWAY IF: You develop worsening fever. If your symptoms do not improve within 10 days You develop yellow or green discharge from your nose over 3 days. You have coughing fits You develop a severe head ache or visual changes. You develop shortness of breath, difficulty breathing or start having chest pain Your symptoms persist after you have completed your treatment plan  MAKE SURE YOU  Understand these instructions. Will watch your condition. Will get help right away if you are not doing well or get worse.  Thank you for choosing an e-visit.  Your e-visit answers were reviewed by a board certified advanced clinical practitioner to complete your personal care plan. Depending upon the condition, your plan could have included both over the counter or prescription medications.  Please review your pharmacy choice. Make sure the pharmacy is open so you can pick up prescription now. If there is a problem, you may contact your provider through [employer] and have the prescription routed to another pharmacy.  Your safety is important to [place]. If you have drug allergies check your prescription carefully.   For the next 24 hours you can use MyChart to ask questions about today's visit, request a non-urgent call back, or ask for a work or school excuse. You will get an email in the next two  days asking about your experience. I hope that your e-visit has been valuable and will speed your recovery.   I provided 5 minutes of non face-to-face time during this encounter for chart review and documentation.

## 2022-08-14 ENCOUNTER — Telehealth: Payer: Self-pay | Admitting: Emergency Medicine

## 2022-08-14 DIAGNOSIS — K529 Noninfective gastroenteritis and colitis, unspecified: Secondary | ICD-10-CM

## 2022-08-14 DIAGNOSIS — R112 Nausea with vomiting, unspecified: Secondary | ICD-10-CM

## 2022-08-14 MED ORDER — ONDANSETRON HCL 4 MG PO TABS: 4.0000 mg | ORAL_TABLET | Freq: Three times a day (TID) | ORAL | 0 refills | Status: DC | PRN

## 2022-08-14 NOTE — Progress Notes (Signed)
We are sorry that you are not feeling well.  Here is how we plan to help!  Based on what you have shared with me it looks like you have Acute Infectious Diarrhea.  Most cases of acute diarrhea are due to infections with virus and bacteria and are self-limited conditions lasting less than 14 days.  For your symptoms you may take Imodium 2 mg tablets that are over the counter at your local pharmacy. Take two tablet now and then one after each loose stool up to 6 a day.  Antibiotics are not needed for most people with diarrhea.  I have also prescribed Zofran 4 mg 1 tablet every 8 hours as needed for nausea and vomiting   HOME CARE We recommend changing your diet to help with your symptoms for the next few days. Drink plenty of fluids that contain water salt and sugar. Sports drinks such as Gatorade may help.  You may try broths, soups, bananas, applesauce, soft breads, mashed potatoes or crackers.  You are considered infectious for as long as the diarrhea continues. Hand washing or use of alcohol based hand sanitizers is recommend. It is best to stay out of work or school until your symptoms stop.   GET HELP RIGHT AWAY If you have dark yellow colored urine or do not pass urine frequently you should drink more fluids.   If your symptoms worsen  If you feel like you are going to pass out (faint) You have a new problem  MAKE SURE YOU  Understand these instructions. Will watch your condition. Will get help right away if you are not doing well or get worse.  Thank you for choosing an e-visit.  Your e-visit answers were reviewed by a board certified advanced clinical practitioner to complete your personal care plan. Depending upon the condition, your plan could have included both over the counter or prescription medications.  Please review your pharmacy choice. Make sure the pharmacy is open so you can pick up prescription now. If there is a problem, you may contact your provider through  [employer] and have the prescription routed to another pharmacy.  Your safety is important to [place]. If you have drug allergies check your prescription carefully.   For the next 24 hours you can use MyChart to ask questions about today's visit, request a non-urgent call back, or ask for a work or school excuse. You will get an email in the next two days asking about your experience. I hope that your e-visit has been valuable and will speed your recovery.  I have spent 5 minutes in review of e-visit questionnaire, review and updating patient chart, medical decision making and response to patient.   Willeen Cass, PhD, FNP-BC

## 2022-09-03 ENCOUNTER — Telehealth: Payer: Self-pay | Admitting: Physician Assistant

## 2022-09-03 DIAGNOSIS — R112 Nausea with vomiting, unspecified: Secondary | ICD-10-CM

## 2022-09-03 MED ORDER — ONDANSETRON HCL 4 MG PO TABS: 4.0000 mg | ORAL_TABLET | Freq: Three times a day (TID) | ORAL | 0 refills | Status: DC | PRN

## 2022-09-03 NOTE — Progress Notes (Signed)
E-Visit for Nausea and Vomiting   We are sorry that you are not feeling well. Here is how we plan to help!  Based on what you have shared with me it looks like you have a Virus that is irritating your GI tract.  Vomiting is the forceful emptying of a portion of the stomach's content through the mouth.  Although nausea and vomiting can make you feel miserable, it's important to remember that these are not diseases, but rather symptoms of an underlying illness.  When we treat short term symptoms, we always caution that any symptoms that persist should be fully evaluated in a medical office.  I have prescribed a medication that will help alleviate your symptoms and allow you to stay hydrated:  Zofran 4 mg 1 tablet every 8 hours as needed for nausea and vomiting  HOME CARE: Drink clear liquids.  This is very important! Dehydration (the lack of fluid) can lead to a serious complication.  Start off with 1 tablespoon every 5 minutes for 8 hours. You may begin eating bland foods after 8 hours without vomiting.  Start with saltine crackers, white bread, rice, mashed potatoes, applesauce. After 48 hours on a bland diet, you may resume a normal diet. Try to go to sleep.  Sleep often empties the stomach and relieves the need to vomit.  GET HELP RIGHT AWAY IF:  Your symptoms do not improve or worsen within 2 days after treatment. You have a fever for over 3 days. You cannot keep down fluids after trying the medication.  MAKE SURE YOU:  Understand these instructions. Will watch your condition. Will get help right away if you are not doing well or get worse.    Thank you for choosing an e-visit.  Your e-visit answers were reviewed by a board certified advanced clinical practitioner to complete your personal care plan. Depending upon the condition, your plan could have included both over the counter or prescription medications.  Please review your pharmacy choice. Make sure the pharmacy is open so  you can pick up prescription now. If there is a problem, you may contact your provider through MyChart messaging and have the prescription routed to another pharmacy.  Your safety is important to us. If you have drug allergies check your prescription carefully.   For the next 24 hours you can use MyChart to ask questions about today's visit, request a non-urgent call back, or ask for a work or school excuse. You will get an email in the next two days asking about your experience. I hope that your e-visit has been valuable and will speed your recovery.  I have spent 5 minutes in review of e-visit questionnaire, review and updating patient chart, medical decision making and response to patient.   Trestin Vences M Abbigal Radich, PA-C  

## 2022-09-28 ENCOUNTER — Telehealth: Payer: Self-pay | Admitting: Physician Assistant

## 2022-09-28 DIAGNOSIS — A084 Viral intestinal infection, unspecified: Secondary | ICD-10-CM

## 2022-09-28 MED ORDER — ONDANSETRON 4 MG PO TBDP: 4.0000 mg | ORAL_TABLET | Freq: Three times a day (TID) | ORAL | 0 refills | Status: DC | PRN

## 2022-09-28 NOTE — Progress Notes (Signed)

## 2022-09-28 NOTE — Progress Notes (Signed)
I have spent 5 minutes in review of e-visit questionnaire, review and updating patient chart, medical decision making and response to patient.   Loomis Anacker Cody Latiesha Harada, PA-C    

## 2022-09-29 ENCOUNTER — Telehealth: Payer: Self-pay | Admitting: Physician Assistant

## 2022-09-29 DIAGNOSIS — A084 Viral intestinal infection, unspecified: Secondary | ICD-10-CM

## 2022-09-29 NOTE — Progress Notes (Signed)
E-Visit for Nausea and Vomiting   We are sorry that you are not feeling well. Here is how we plan to help!  Based on what you have shared with me it looks like you have a Virus that is irritating your GI tract.  Vomiting is the forceful emptying of a portion of the stomach's content through the mouth.  Although nausea and vomiting can make you feel miserable, it's important to remember that these are not diseases, but rather symptoms of an underlying illness.  When we treat short term symptoms, we always caution that any symptoms that persist should be fully evaluated in a medical office.  I have prescribed a medication that will help alleviate your symptoms and allow you to stay hydrated:  Zofran 4 mg 1 tablet every 8 hours as needed for nausea and vomiting and can use Imodium A-D '2mg'$  every 6 hours as needed for diarrhea (available OTC)  HOME CARE: Drink clear liquids.  This is very important! Dehydration (the lack of fluid) can lead to a serious complication.  Start off with 1 tablespoon every 5 minutes for 8 hours. You may begin eating bland foods after 8 hours without vomiting.  Start with saltine crackers, white bread, rice, mashed potatoes, applesauce. After 48 hours on a bland diet, you may resume a normal diet. Try to go to sleep.  Sleep often empties the stomach and relieves the need to vomit.  GET HELP RIGHT AWAY IF:  Your symptoms do not improve or worsen within 2 days after treatment. You have a fever for over 3 days. You cannot keep down fluids after trying the medication.  MAKE SURE YOU:  Understand these instructions. Will watch your condition. Will get help right away if you are not doing well or get worse.    Thank you for choosing an e-visit.  Your e-visit answers were reviewed by a board certified advanced clinical practitioner to complete your personal care plan. Depending upon the condition, your plan could have included both over the counter or prescription  medications.  Please review your pharmacy choice. Make sure the pharmacy is open so you can pick up prescription now. If there is a problem, you may contact your provider through [employer] and have the prescription routed to another pharmacy.  Your safety is important to [place]. If you have drug allergies check your prescription carefully.   For the next 24 hours you can use MyChart to ask questions about today's visit, request a non-urgent call back, or ask for a work or school excuse. You will get an email in the next two days asking about your experience. I hope that your e-visit has been valuable and will speed your recovery.  I have spent 5 minutes in review of e-visit questionnaire, review and updating patient chart, medical decision making and response to patient.   Mar Daring, PA-C

## 2023-01-19 ENCOUNTER — Encounter: Payer: Self-pay | Admitting: Nurse Practitioner

## 2023-01-19 ENCOUNTER — Telehealth: Payer: Self-pay | Admitting: Nurse Practitioner

## 2023-01-19 DIAGNOSIS — R195 Other fecal abnormalities: Secondary | ICD-10-CM

## 2023-01-19 MED ORDER — LOPERAMIDE HCL 2 MG PO TABS: 2.0000 mg | ORAL_TABLET | Freq: Four times a day (QID) | ORAL | 0 refills | Status: DC | PRN

## 2023-01-19 NOTE — Progress Notes (Signed)
E-Visit for Diarrhea  We are sorry that you are not feeling well.  Here is how we plan to help!  Based on what you have shared with me it looks like you have Acute Infectious Diarrhea.  Most cases of acute diarrhea are due to infections with virus and bacteria and are self-limited conditions lasting less than 14 days.  For your symptoms you may take Imodium 2 mg tablets every 6 hours. Take two tablet now and then one after each loose stool up to 6 a day.  Antibiotics are not needed for most people with diarrhea.   HOME CARE We recommend changing your diet to help with your symptoms for the next few days. Drink plenty of fluids that contain water salt and sugar. Sports drinks such as Gatorade may help.  You may try broths, soups, bananas, applesauce, soft breads, mashed potatoes or crackers.  You are considered infectious for as long as the diarrhea continues. Hand washing or use of alcohol based hand sanitizers is recommend. It is best to stay out of work or school until your symptoms stop.   GET HELP RIGHT AWAY If you have dark yellow colored urine or do not pass urine frequently you should drink more fluids.   If your symptoms worsen  If you feel like you are going to pass out (faint) You have a new problem  MAKE SURE YOU  Understand these instructions. Will watch your condition. Will get help right away if you are not doing well or get worse.  Thank you for choosing an e-visit.  Your e-visit answers were reviewed by a board certified advanced clinical practitioner to complete your personal care plan. Depending upon the condition, your plan could have included both over the counter or prescription medications.  Please review your pharmacy choice. Make sure the pharmacy is open so you can pick up prescription now. If there is a problem, you may contact your provider through [employer] and have the prescription routed to another pharmacy.  Your safety is important to [place]. If  you have drug allergies check your prescription carefully.   For the next 24 hours you can use MyChart to ask questions about today's visit, request a non-urgent call back, or ask for a work or school excuse. You will get an email in the next two days asking about your experience. I hope that your e-visit has been valuable and will speed your recovery.

## 2023-01-19 NOTE — Progress Notes (Signed)
I have spent 5 minutes in review of e-visit questionnaire, review and updating patient chart, medical decision making and response to patient.  ° °Derl Abalos W Roylene Heaton, NP ° °  °

## 2023-05-14 ENCOUNTER — Telehealth: Payer: Self-pay | Admitting: Family Medicine

## 2023-05-14 ENCOUNTER — Encounter: Payer: Self-pay | Admitting: Family Medicine

## 2023-05-14 DIAGNOSIS — R112 Nausea with vomiting, unspecified: Secondary | ICD-10-CM

## 2023-05-14 MED ORDER — ONDANSETRON 4 MG PO TBDP: 4.0000 mg | ORAL_TABLET | Freq: Three times a day (TID) | ORAL | 0 refills | Status: DC | PRN

## 2023-05-14 NOTE — Progress Notes (Signed)
E-Visit for Nausea and Vomiting   We are sorry that you are not feeling well. Here is how we plan to help!  Based on what you have shared with me it looks like you have a Virus that is irritating your GI tract.  Vomiting is the forceful emptying of a portion of the stomach's content through the mouth.  Although nausea and vomiting can make you feel miserable, it's important to remember that these are not diseases, but rather symptoms of an underlying illness.  When we treat short term symptoms, we always caution that any symptoms that persist should be fully evaluated in a medical office.  I have prescribed a medication that will help alleviate your symptoms and allow you to stay hydrated:  Zofran 4 mg 1 tablet every 8 hours as needed for nausea and vomiting  HOME CARE: Drink clear liquids.  This is very important! Dehydration (the lack of fluid) can lead to a serious complication.  Start off with 1 tablespoon every 5 minutes for 8 hours. You may begin eating bland foods after 8 hours without vomiting.  Start with saltine crackers, white bread, rice, mashed potatoes, applesauce. After 48 hours on a bland diet, you may resume a normal diet. Try to go to sleep.  Sleep often empties the stomach and relieves the need to vomit.  GET HELP RIGHT AWAY IF:  Your symptoms do not improve or worsen within 2 days after treatment. You have a fever for over 3 days. You cannot keep down fluids after trying the medication.  MAKE SURE YOU:  Understand these instructions. Will watch your condition. Will get help right away if you are not doing well or get worse.    Thank you for choosing an e-visit.  Your e-visit answers were reviewed by a board certified advanced clinical practitioner to complete your personal care plan. Depending upon the condition, your plan could have included both over the counter or prescription medications.  Please review your pharmacy choice. Make sure the pharmacy is open so  you can pick up prescription now. If there is a problem, you may contact your provider through MyChart messaging and have the prescription routed to another pharmacy.  Your safety is important to us. If you have drug allergies check your prescription carefully.   For the next 24 hours you can use MyChart to ask questions about today's visit, request a non-urgent call back, or ask for a work or school excuse. You will get an email in the next two days asking about your experience. I hope that your e-visit has been valuable and will speed your recovery.  I provided 5 minutes of non face-to-face time during this encounter for chart review, medication and order placement, as well as and documentation.   

## 2023-07-02 ENCOUNTER — Telehealth: Payer: Self-pay | Admitting: Physician Assistant

## 2023-07-02 DIAGNOSIS — J069 Acute upper respiratory infection, unspecified: Secondary | ICD-10-CM

## 2023-07-02 MED ORDER — BENZONATATE 100 MG PO CAPS: 100.0000 mg | ORAL_CAPSULE | Freq: Three times a day (TID) | ORAL | 0 refills | Status: DC | PRN

## 2023-07-02 MED ORDER — FLUTICASONE PROPIONATE 50 MCG/ACT NA SUSP: 2.0000 | Freq: Every day | NASAL | 0 refills | Status: DC

## 2023-07-02 NOTE — Progress Notes (Signed)
I have spent 5 minutes in review of e-visit questionnaire, review and updating patient chart, medical decision making and response to patient.   Mia Milan Cody Jacklynn Dehaas, PA-C    

## 2023-07-02 NOTE — Progress Notes (Signed)

## 2023-10-15 ENCOUNTER — Telehealth: Payer: Self-pay | Admitting: Physician Assistant

## 2023-10-15 DIAGNOSIS — A084 Viral intestinal infection, unspecified: Secondary | ICD-10-CM

## 2023-10-15 MED ORDER — ONDANSETRON 4 MG PO TBDP: 4.0000 mg | ORAL_TABLET | Freq: Three times a day (TID) | ORAL | 0 refills | Status: DC | PRN

## 2023-10-15 NOTE — Progress Notes (Signed)
I have spent 5 minutes in review of e-visit questionnaire, review and updating patient chart, medical decision making and response to patient.   Piedad Climes, PA-C

## 2023-10-15 NOTE — Progress Notes (Signed)
E-Visit for Diarrhea  We are sorry that you are not feeling well.  Here is how we plan to help!  Based on what you have shared with me it looks like you have Acute Infectious Diarrhea.  Most cases of acute diarrhea are due to infections with virus and bacteria and are self-limited conditions lasting less than 14 days.  For your symptoms you may take Imodium 2 mg tablets that are over the counter at your local pharmacy. Take two tablet now and then one after each loose stool up to 6 a day.  Antibiotics are not needed for most people with diarrhea.  I have prescribed Zofran 4 mg 1 tablet every 8 hours as needed for nausea and vomiting  HOME CARE We recommend changing your diet to help with your symptoms for the next few days. Drink plenty of fluids that contain water salt and sugar. Sports drinks such as Gatorade may help.  You may try broths, soups, bananas, applesauce, soft breads, mashed potatoes or crackers.  You are considered infectious for as long as the diarrhea continues. Hand washing or use of alcohol based hand sanitizers is recommend. It is best to stay out of work or school until your symptoms stop.   GET HELP RIGHT AWAY If you have dark yellow colored urine or do not pass urine frequently you should drink more fluids.   If your symptoms worsen  If you feel like you are going to pass out (faint) You have a new problem  MAKE SURE YOU  Understand these instructions. Will watch your condition. Will get help right away if you are not doing well or get worse.  Thank you for choosing an e-visit.  Your e-visit answers were reviewed by a board certified advanced clinical practitioner to complete your personal care plan. Depending upon the condition, your plan could have included both over the counter or prescription medications.  Please review your pharmacy choice. Make sure the pharmacy is open so you can pick up prescription now. If there is a problem, you may contact your  provider through [employer] and have the prescription routed to another pharmacy.  Your safety is important to [place]. If you have drug allergies check your prescription carefully.   For the next 24 hours you can use MyChart to ask questions about today's visit, request a non-urgent call back, or ask for a work or school excuse. You will get an email in the next two days asking about your experience. I hope that your e-visit has been valuable and will speed your recovery.

## 2023-11-12 ENCOUNTER — Telehealth: Payer: Self-pay | Admitting: Physician Assistant

## 2023-11-12 DIAGNOSIS — F419 Anxiety disorder, unspecified: Secondary | ICD-10-CM

## 2023-11-12 DIAGNOSIS — K529 Noninfective gastroenteritis and colitis, unspecified: Secondary | ICD-10-CM

## 2023-11-12 NOTE — Progress Notes (Signed)
E-Visit for Diarrhea  We are sorry that you are not feeling well.  Here is how we plan to help!  Based on what you have shared with me it looks like you have Acute Infectious Diarrhea.  Most cases of acute diarrhea are due to infections with virus and bacteria and are self-limited conditions lasting less than 14 days.  For your symptoms you may take Imodium 2 mg tablets that are over the counter at your local pharmacy. Take two tablet now and then one after each loose stool up to 6 a day.  Antibiotics are not needed for most people with diarrhea.   HOME CARE We recommend changing your diet to help with your symptoms for the next few days. Drink plenty of fluids that contain water salt and sugar. Sports drinks such as Gatorade may help.  You may try broths, soups, bananas, applesauce, soft breads, mashed potatoes or crackers.  You are considered infectious for as long as the diarrhea continues. Hand washing or use of alcohol based hand sanitizers is recommend. It is best to stay out of work or school until your symptoms stop.   GET HELP RIGHT AWAY If you have dark yellow colored urine or do not pass urine frequently you should drink more fluids.   If your symptoms worsen  If you feel like you are going to pass out (faint) You have a new problem  MAKE SURE YOU  Understand these instructions. Will watch your condition. Will get help right away if you are not doing well or get worse.  Thank you for choosing an e-visit.  Your e-visit answers were reviewed by a board certified advanced clinical practitioner to complete your personal care plan. Depending upon the condition, your plan could have included both over the counter or prescription medications.  Please review your pharmacy choice. Make sure the pharmacy is open so you can pick up prescription now. If there is a problem, you may contact your provider through [employer] and have the prescription routed to another pharmacy.   Your safety is important to [place]. If you have drug allergies check your prescription carefully.   For the next 24 hours you can use MyChart to ask questions about today's visit, request a non-urgent call back, or ask for a work or school excuse. You will get an email in the next two days asking about your experience. I hope that your e-visit has been valuable and will speed your recovery.

## 2023-11-12 NOTE — Progress Notes (Signed)
   Thank you for the details you included in the comment boxes. Those details are very helpful in determining the best course of treatment for you and help [place] to provide the best care. Because a panic attack is not something we treat normally through e-visits, we recommend that you schedule a Virtual Urgent Care video visit in order for the provider to better assess what is going on.  The provider will be able to give you a more accurate diagnosis and treatment plan if we can more freely discuss your symptoms and with the addition of a virtual examination.   If you change your visit to a video visit, we will bill your insurance (similar to an office visit) and you will not be charged for this e-Visit. You will be able to stay at home and speak with the first available [facility] advanced practice provider. The link to do a video visit is in the drop down Menu tab of your Welcome screen in MyChart.     I have spent 5 minutes in review of e-visit questionnaire, review and updating patient chart, medical decision making and response to patient.   Margaretann Loveless, PA-C

## 2023-11-12 NOTE — Progress Notes (Signed)
 I have spent 5 minutes in review of e-visit questionnaire, review and updating patient chart, medical decision making and response to patient.   Piedad Climes, PA-C

## 2023-12-03 ENCOUNTER — Telehealth: Payer: Self-pay | Admitting: Physician Assistant

## 2023-12-03 DIAGNOSIS — A084 Viral intestinal infection, unspecified: Secondary | ICD-10-CM

## 2023-12-03 MED ORDER — ONDANSETRON 4 MG PO TBDP: 4.0000 mg | ORAL_TABLET | Freq: Three times a day (TID) | ORAL | 0 refills | Status: DC | PRN

## 2023-12-03 NOTE — Progress Notes (Signed)
 I have spent 5 minutes in review of e-visit questionnaire, review and updating patient chart, medical decision making and response to patient.   Piedad Climes, PA-C

## 2023-12-03 NOTE — Progress Notes (Signed)
 E-Visit for Vomiting  We are sorry that you are not feeling well. Here is how we plan to help!  Based on what you have shared with me it looks like you have a Virus that is irritating your GI tract.  Vomiting is the forceful emptying of a portion of the stomach's content through the mouth.  Although nausea and vomiting can make you feel miserable, it's important to remember that these are not diseases, but rather symptoms of an underlying illness.  When we treat short term symptoms, we always caution that any symptoms that persist should be fully evaluated in a medical office.  I have prescribed a medication that will help alleviate your symptoms and allow you to stay hydrated:  Zofran 4 mg 1 tablet every 8 hours as needed for nausea and vomiting  Giving recurring bouts of nausea and vomiting over the past few months, I recommend an in-person follow-up with your PCP or at local urgent care for further discussion, to make sure nothing else is going on that is causing these episodes.   HOME CARE: Drink clear liquids.  This is very important! Dehydration (the lack of fluid) can lead to a serious complication.  Start off with 1 tablespoon every 5 minutes for 8 hours. You may begin eating bland foods after 8 hours without vomiting.  Start with saltine crackers, white bread, rice, mashed potatoes, applesauce. After 48 hours on a bland diet, you may resume a normal diet. Try to go to sleep.  Sleep often empties the stomach and relieves the need to vomit.  GET HELP RIGHT AWAY IF:  Your symptoms do not improve or worsen within 2 days after treatment. You have a fever for over 3 days. You cannot keep down fluids after trying the medication.  MAKE SURE YOU:  Understand these instructions. Will watch your condition. Will get help right away if you are not doing well or get worse.   Thank you for choosing an e-visit.  Your e-visit answers were reviewed by a board certified advanced clinical  practitioner to complete your personal care plan. Depending upon the condition, your plan could have included both over the counter or prescription medications.  Please review your pharmacy choice. Make sure the pharmacy is open so you can pick up prescription now. If there is a problem, you may contact your provider through [employer] and have the prescription routed to another pharmacy.  Your safety is important to [place]. If you have drug allergies check your prescription carefully.   For the next 24 hours you can use MyChart to ask questions about today's visit, request a non-urgent call back, or ask for a work or school excuse. You will get an email in the next two days asking about your experience. I hope that your e-visit has been valuable and will speed your recovery.

## 2023-12-13 ENCOUNTER — Telehealth: Payer: Self-pay | Admitting: Physician Assistant

## 2023-12-13 ENCOUNTER — Encounter: Payer: Self-pay | Admitting: Physician Assistant

## 2023-12-13 DIAGNOSIS — R12 Heartburn: Secondary | ICD-10-CM

## 2023-12-13 NOTE — Progress Notes (Signed)
 E-Visit for Heartburn  We are sorry that you are not feeling well.  Here is how we plan to help!  Based on what you shared with me it looks like you most likely have Gastroesophageal Reflux Disease (GERD)  Gastroesophageal reflux disease (GERD) happens when acid from your stomach flows up into the esophagus.  When acid comes in contact with the esophagus, the acid causes sorenss (inflammation) in the esophagus.  Over time, GERD may create small holes (ulcers) in the lining of the esophagus.  I recommend using over the counter Pepcid 20mg  one by mouth twice a day for two weeks.  Your symptoms should improve in the next day or two.  You can use antacids as needed until symptoms resolve.  Call us if your heartburn worsens, you have trouble swallowing, weight loss, spitting up blood or recurrent vomiting.  Home Care: May include lifestyle changes such as weight loss, quitting smoking and alcohol consumption Avoid foods and drinks that make your symptoms worse, such as: Caffeine or alcoholic drinks Chocolate Peppermint or mint flavorings Garlic and onions Spicy foods Citrus fruits, such as oranges, lemons, or limes Tomato-based foods such as sauce, chili, salsa and pizza Fried and fatty foods Avoid lying down for 3 hours prior to your bedtime or prior to taking a nap Eat small, frequent meals instead of a large meals Wear loose-fitting clothing.  Do not wear anything tight around your waist that causes pressure on your stomach. Raise the head of your bed 6 to 8 inches with wood blocks to help you sleep.  Extra pillows will not help.  Seek Help Right Away If: You have pain in your arms, neck, jaw, teeth or back Your pain increases or changes in intensity or duration You develop nausea, vomiting or sweating (diaphoresis) You develop shortness of breath or you faint Your vomit is green, yellow, black or looks like coffee grounds or blood Your stool is red, bloody or black  These symptoms  could be signs of other problems, such as heart disease, gastric bleeding or esophageal bleeding.  Make sure you : Understand these instructions. Will watch your condition. Will get help right away if you are not doing well or get worse.  Your e-visit answers were reviewed by a board certified advanced clinical practitioner to complete your personal care plan.  Depending on the condition, your plan could have included both over the counter or prescription medications.  If there is a problem please reply  once you have received a response from your provider.  Your safety is important to [place].  If you have drug allergies check your prescription carefully.    You can use MyChart to ask questions about today's visit, request a non-urgent call back, or ask for a work or school excuse for 24 hours related to this e-Visit. If it has been greater than 24 hours you will need to follow up with your provider, or enter a new e-Visit to address those concerns.  You will get an e-mail in the next two days asking about your experience.  I hope that your e-visit has been valuable and will speed your recovery. Thank you for using e-visits.  I have spent 5 minutes in review of e-visit questionnaire, review and updating patient chart, medical decision making and response to patient.   Kasandra Knudsen Mayers, PA-C

## 2023-12-25 ENCOUNTER — Telehealth: Payer: Self-pay | Admitting: Physician Assistant

## 2023-12-25 ENCOUNTER — Encounter: Payer: Self-pay | Admitting: Family Medicine

## 2023-12-25 ENCOUNTER — Telehealth: Payer: Self-pay | Admitting: Family Medicine

## 2023-12-25 DIAGNOSIS — J069 Acute upper respiratory infection, unspecified: Secondary | ICD-10-CM

## 2023-12-25 DIAGNOSIS — R0602 Shortness of breath: Secondary | ICD-10-CM

## 2023-12-25 MED ORDER — BENZONATATE 100 MG PO CAPS: 100.0000 mg | ORAL_CAPSULE | Freq: Three times a day (TID) | ORAL | 0 refills | Status: DC | PRN

## 2023-12-25 MED ORDER — IPRATROPIUM BROMIDE 0.03 % NA SOLN: 2.0000 | Freq: Two times a day (BID) | NASAL | 0 refills | Status: DC

## 2023-12-25 NOTE — Progress Notes (Signed)

## 2023-12-25 NOTE — Progress Notes (Signed)
  Because of frequency and severity of SOB and need for lung exam, I feel your condition warrants further evaluation and I recommend that you be seen in a face-to-face visit.   NOTE: There will be NO CHARGE for this E-Visit   If you are having a true medical emergency, please call 911.     For an urgent face to face visit, [facility] has multiple urgent care centers for your convenience.  Click the link below for the full list of locations and hours, walk-in wait times, appointment scheduling options and driving directions:  Urgent Care - [place], [place], [place], [place], [place], [place]  [place]     Your MyChart E-visit questionnaire answers were reviewed by a board certified advanced clinical practitioner to complete your personal care plan based on your specific symptoms.    Thank you for using e-Visits.

## 2024-01-27 ENCOUNTER — Telehealth: Payer: Self-pay | Admitting: Physician Assistant

## 2024-01-27 DIAGNOSIS — K047 Periapical abscess without sinus: Secondary | ICD-10-CM

## 2024-01-27 MED ORDER — NAPROXEN 500 MG PO TABS: 500.0000 mg | ORAL_TABLET | Freq: Two times a day (BID) | ORAL | 0 refills | Status: DC

## 2024-01-27 NOTE — Progress Notes (Signed)
 E-Visit for Dental Pain  We are sorry that you are not feeling well.  Here is how we plan to help!  Based on what you have shared with me in the questionnaire, it sounds like you have dental pain from a fractured tooth. I have prescribed Naprosyn 500mg  2 times a day for 7 days for discomfort. Avoid chewing on the affected side. I would call your local Health Department to get set up for their dental clinic since they can offer assistance much cheaper than private dentists.   It is imperative that you see a dentist within 10 days of this eVisit to determine the cause of the dental pain and be sure it is adequately treated  A toothache or tooth pain is caused when the nerve in the root of a tooth or surrounding a tooth is irritated. Dental (tooth) infection, decay, injury, or loss of a tooth are the most common causes of dental pain. Pain may also occur after an extraction (tooth is pulled out). Pain sometimes originates from other areas and radiates to the jaw, thus appearing to be tooth pain.Bacteria growing inside your mouth can contribute to gum disease and dental decay, both of which can cause pain. A toothache occurs from inflammation of the central portion of the tooth called pulp. The pulp contains nerve endings that are very sensitive to pain. Inflammation to the pulp or pulpitis may be caused by dental cavities, trauma, and infection.    HOME CARE:   For toothaches: Over-the-counter pain medications such as acetaminophen  or ibuprofen  may be used. Take these as directed on the package while you arrange for a dental appointment. Avoid very cold or hot foods, because they may make the pain worse. You may get relief from biting on a cotton ball soaked in oil of cloves. You can get oil of cloves at most drug stores.  For jaw pain:  Aspirin may be helpful for problems in the joint of the jaw in adults. If pain happens every time you open your mouth widely, the temporomandibular joint (TMJ) may  be the source of the pain. Yawning or taking a large bite of food may worsen the pain. An appointment with your doctor or dentist will help you find the cause.     GET HELP RIGHT AWAY IF:  You have a high fever or chills If you have had a recent head or face injury and develop headache, light headedness, nausea, vomiting, or other symptoms that concern you after an injury to your face or mouth, you could have a more serious injury in addition to your dental injury. A facial rash associated with a toothache: This condition may improve with medication. Contact your doctor for them to decide what is appropriate. Any jaw pain occurring with chest pain: Although jaw pain is most commonly caused by dental disease, it is sometimes referred pain from other areas. People with heart disease, especially people who have had stents placed, people with diabetes, or those who have had heart surgery may have jaw pain as a symptom of heart attack or angina. If your jaw or tooth pain is associated with lightheadedness, sweating, or shortness of breath, you should see a doctor as soon as possible. Trouble swallowing or excessive pain or bleeding from gums: If you have a history of a weakened immune system, diabetes, or steroid use, you may be more susceptible to infections. Infections can often be more severe and extensive or caused by unusual organisms. Dental and gum infections in  people with these conditions may require more aggressive treatment. An abscess may need draining or IV antibiotics, for example.  MAKE SURE YOU   Understand these instructions. Will watch your condition. Will get help right away if you are not doing well or get worse.  Thank you for choosing an e-visit.  Your e-visit answers were reviewed by a board certified advanced clinical practitioner to complete your personal care plan. Depending upon the condition, your plan could have included both over the counter or prescription  medications.  Please review your pharmacy choice. Make sure the pharmacy is open so you can pick up prescription now. If there is a problem, you may contact your provider through [employer] and have the prescription routed to another pharmacy.  Your safety is important to us . If you have drug allergies check your prescription carefully.   For the next 24 hours you can use MyChart to ask questions about today's visit, request a non-urgent call back, or ask for a work or school excuse. You will get an email in the next two days asking about your experience. I hope that your e-visit has been valuable and will speed your recovery.

## 2024-01-27 NOTE — Progress Notes (Signed)
 I have spent 5 minutes in review of e-visit questionnaire, review and updating patient chart, medical decision making and response to patient.   Piedad Climes, PA-C

## 2024-03-09 ENCOUNTER — Telehealth: Payer: Self-pay | Admitting: Physician Assistant

## 2024-03-09 DIAGNOSIS — K529 Noninfective gastroenteritis and colitis, unspecified: Secondary | ICD-10-CM

## 2024-03-09 NOTE — Progress Notes (Signed)
 I have spent 5 minutes in review of e-visit questionnaire, review and updating patient chart, medical decision making and response to patient.   Piedad Climes, PA-C

## 2024-03-09 NOTE — Progress Notes (Signed)
E-Visit for Diarrhea  We are sorry that you are not feeling well.  Here is how we plan to help!  Based on what you have shared with me it looks like you have Acute Infectious Diarrhea.  Most cases of acute diarrhea are due to infections with virus and bacteria and are self-limited conditions lasting less than 14 days.  For your symptoms you may take Imodium 2 mg tablets that are over the counter at your local pharmacy. Take two tablet now and then one after each loose stool up to 6 a day.  Antibiotics are not needed for most people with diarrhea.   HOME CARE We recommend changing your diet to help with your symptoms for the next few days. Drink plenty of fluids that contain water salt and sugar. Sports drinks such as Gatorade may help.  You may try broths, soups, bananas, applesauce, soft breads, mashed potatoes or crackers.  You are considered infectious for as long as the diarrhea continues. Hand washing or use of alcohol based hand sanitizers is recommend. It is best to stay out of work or school until your symptoms stop.   GET HELP RIGHT AWAY If you have dark yellow colored urine or do not pass urine frequently you should drink more fluids.   If your symptoms worsen  If you feel like you are going to pass out (faint) You have a new problem  MAKE SURE YOU  Understand these instructions. Will watch your condition. Will get help right away if you are not doing well or get worse.  Thank you for choosing an e-visit.  Your e-visit answers were reviewed by a board certified advanced clinical practitioner to complete your personal care plan. Depending upon the condition, your plan could have included both over the counter or prescription medications.  Please review your pharmacy choice. Make sure the pharmacy is open so you can pick up prescription now. If there is a problem, you may contact your provider through [employer] and have the prescription routed to another pharmacy.   Your safety is important to [place]. If you have drug allergies check your prescription carefully.   For the next 24 hours you can use MyChart to ask questions about today's visit, request a non-urgent call back, or ask for a work or school excuse. You will get an email in the next two days asking about your experience. I hope that your e-visit has been valuable and will speed your recovery.

## 2024-03-17 ENCOUNTER — Other Ambulatory Visit: Payer: Self-pay

## 2024-03-17 ENCOUNTER — Inpatient Hospital Stay (HOSPITAL_COMMUNITY): Payer: Self-pay | Admitting: Certified Registered Nurse Anesthetist

## 2024-03-17 ENCOUNTER — Inpatient Hospital Stay (HOSPITAL_COMMUNITY)
Admission: EM | Admit: 2024-03-17 | Discharge: 2024-03-24 | DRG: 742 | Disposition: A | Discharge status: Home / Self Care | Payer: Self-pay | Attending: Internal Medicine | Admitting: Internal Medicine

## 2024-03-17 ENCOUNTER — Encounter (HOSPITAL_COMMUNITY)
Admission: EM | Disposition: A | Discharge status: Home / Self Care | Payer: Self-pay | Source: Home / Self Care | Attending: Pulmonary Disease

## 2024-03-17 ENCOUNTER — Inpatient Hospital Stay (HOSPITAL_COMMUNITY): Payer: Self-pay

## 2024-03-17 ENCOUNTER — Emergency Department (HOSPITAL_COMMUNITY): Payer: Self-pay

## 2024-03-17 DIAGNOSIS — Z794 Long term (current) use of insulin: Secondary | ICD-10-CM

## 2024-03-17 DIAGNOSIS — R739 Hyperglycemia, unspecified: Principal | ICD-10-CM | POA: Diagnosis present

## 2024-03-17 DIAGNOSIS — D353 Benign neoplasm of craniopharyngeal duct: Secondary | ICD-10-CM | POA: Diagnosis present

## 2024-03-17 DIAGNOSIS — D352 Benign neoplasm of pituitary gland: Secondary | ICD-10-CM | POA: Diagnosis present

## 2024-03-17 DIAGNOSIS — R339 Retention of urine, unspecified: Secondary | ICD-10-CM | POA: Diagnosis present

## 2024-03-17 DIAGNOSIS — E063 Autoimmune thyroiditis: Secondary | ICD-10-CM | POA: Diagnosis present

## 2024-03-17 DIAGNOSIS — D649 Anemia, unspecified: Secondary | ICD-10-CM | POA: Diagnosis present

## 2024-03-17 DIAGNOSIS — Z809 Family history of malignant neoplasm, unspecified: Secondary | ICD-10-CM

## 2024-03-17 DIAGNOSIS — D72829 Elevated white blood cell count, unspecified: Secondary | ICD-10-CM

## 2024-03-17 DIAGNOSIS — N179 Acute kidney failure, unspecified: Secondary | ICD-10-CM | POA: Diagnosis present

## 2024-03-17 DIAGNOSIS — D259 Leiomyoma of uterus, unspecified: Secondary | ICD-10-CM

## 2024-03-17 DIAGNOSIS — J9601 Acute respiratory failure with hypoxia: Secondary | ICD-10-CM | POA: Diagnosis present

## 2024-03-17 DIAGNOSIS — E872 Acidosis, unspecified: Secondary | ICD-10-CM | POA: Diagnosis present

## 2024-03-17 DIAGNOSIS — R109 Unspecified abdominal pain: Secondary | ICD-10-CM | POA: Diagnosis present

## 2024-03-17 DIAGNOSIS — R338 Other retention of urine: Secondary | ICD-10-CM | POA: Insufficient documentation

## 2024-03-17 DIAGNOSIS — K567 Ileus, unspecified: Secondary | ICD-10-CM | POA: Diagnosis present

## 2024-03-17 DIAGNOSIS — Z87891 Personal history of nicotine dependence: Secondary | ICD-10-CM

## 2024-03-17 DIAGNOSIS — K661 Hemoperitoneum: Secondary | ICD-10-CM | POA: Diagnosis present

## 2024-03-17 DIAGNOSIS — F319 Bipolar disorder, unspecified: Secondary | ICD-10-CM | POA: Diagnosis present

## 2024-03-17 DIAGNOSIS — F419 Anxiety disorder, unspecified: Secondary | ICD-10-CM | POA: Diagnosis present

## 2024-03-17 DIAGNOSIS — E876 Hypokalemia: Secondary | ICD-10-CM | POA: Diagnosis present

## 2024-03-17 DIAGNOSIS — R571 Hypovolemic shock: Secondary | ICD-10-CM | POA: Diagnosis present

## 2024-03-17 DIAGNOSIS — N365 Urethral false passage: Secondary | ICD-10-CM | POA: Diagnosis present

## 2024-03-17 DIAGNOSIS — Z466 Encounter for fitting and adjustment of urinary device: Secondary | ICD-10-CM

## 2024-03-17 DIAGNOSIS — L680 Hirsutism: Secondary | ICD-10-CM | POA: Diagnosis present

## 2024-03-17 DIAGNOSIS — K9189 Other postprocedural complications and disorders of digestive system: Secondary | ICD-10-CM | POA: Diagnosis not present

## 2024-03-17 DIAGNOSIS — R410 Disorientation, unspecified: Secondary | ICD-10-CM | POA: Diagnosis present

## 2024-03-17 DIAGNOSIS — E1110 Type 2 diabetes mellitus with ketoacidosis without coma: Secondary | ICD-10-CM | POA: Diagnosis present

## 2024-03-17 DIAGNOSIS — E1065 Type 1 diabetes mellitus with hyperglycemia: Secondary | ICD-10-CM

## 2024-03-17 DIAGNOSIS — E039 Hypothyroidism, unspecified: Secondary | ICD-10-CM

## 2024-03-17 DIAGNOSIS — R578 Other shock: Secondary | ICD-10-CM | POA: Diagnosis present

## 2024-03-17 DIAGNOSIS — G9340 Encephalopathy, unspecified: Secondary | ICD-10-CM | POA: Diagnosis not present

## 2024-03-17 DIAGNOSIS — F1290 Cannabis use, unspecified, uncomplicated: Secondary | ICD-10-CM | POA: Diagnosis present

## 2024-03-17 DIAGNOSIS — Z833 Family history of diabetes mellitus: Secondary | ICD-10-CM

## 2024-03-17 DIAGNOSIS — Z8249 Family history of ischemic heart disease and other diseases of the circulatory system: Secondary | ICD-10-CM

## 2024-03-17 DIAGNOSIS — N939 Abnormal uterine and vaginal bleeding, unspecified: Secondary | ICD-10-CM | POA: Diagnosis present

## 2024-03-17 DIAGNOSIS — E8729 Other acidosis: Secondary | ICD-10-CM

## 2024-03-17 HISTORY — PX: LAPAROTOMY: SHX154

## 2024-03-17 HISTORY — DX: Galactorrhea not associated with childbirth: N64.3

## 2024-03-17 LAB — LACTIC ACID, PLASMA: Lactic Acid, Venous: 6.5 mmol/L (ref 0.5–1.9)

## 2024-03-17 LAB — URINALYSIS, ROUTINE W REFLEX MICROSCOPIC
Bilirubin Urine: NEGATIVE
Glucose, UA: >=500 mg/dL — AB
Ketones, ur: NEGATIVE mg/dL
Leukocytes,Ua: NEGATIVE
Nitrite: NEGATIVE
Protein, ur: 100 mg/dL — AB
Specific Gravity, Urine: 1.017 (ref 1.005–1.030)
pH: 7 (ref 5.0–8.0)

## 2024-03-17 LAB — I-STAT CG4 LACTIC ACID, ED
Lactic Acid, Venous: >15 mmol/L (ref 0.5–1.9)
Lactic Acid, Venous: 14.8 mmol/L (ref 0.5–1.9)

## 2024-03-17 LAB — BLOOD GAS, ARTERIAL
Acid-base deficit: 15.6 mmol/L — ABNORMAL HIGH (ref 0.0–2.0)
Bicarbonate: 12.1 mmol/L — ABNORMAL LOW (ref 20.0–28.0)
Drawn by: 331471
FIO2: 100 %
MECHVT: 400 mL
O2 Saturation: 99.6 %
PEEP: 5 cmH2O
Patient temperature: 37
RATE: 30 {breaths}/min
pCO2 arterial: 34 mmHg (ref 32–48)
pH, Arterial: 7.16 — CL (ref 7.35–7.45)
pO2, Arterial: 407 mmHg — ABNORMAL HIGH (ref 83–108)

## 2024-03-17 LAB — APTT: aPTT: 60 s — ABNORMAL HIGH (ref 24–36)

## 2024-03-17 LAB — BASIC METABOLIC PANEL WITH GFR
Anion gap: 19 — ABNORMAL HIGH (ref 5–15)
Anion gap: 14 (ref 5–15)
BUN: 9 mg/dL (ref 6–20)
BUN: 9 mg/dL (ref 6–20)
CO2: 14 mmol/L — ABNORMAL LOW (ref 22–32)
CO2: 17 mmol/L — ABNORMAL LOW (ref 22–32)
Calcium: 7 mg/dL — ABNORMAL LOW (ref 8.9–10.3)
Calcium: 7.7 mg/dL — ABNORMAL LOW (ref 8.9–10.3)
Chloride: 105 mmol/L (ref 98–111)
Chloride: 111 mmol/L (ref 98–111)
Creatinine, Ser: 1.54 mg/dL — ABNORMAL HIGH (ref 0.44–1.00)
Creatinine, Ser: 0.89 mg/dL (ref 0.44–1.00)
GFR, Estimated: 46 mL/min — ABNORMAL LOW (ref >60)
GFR, Estimated: >60 mL/min (ref >60)
Glucose, Bld: 374 mg/dL — ABNORMAL HIGH (ref 70–99)
Glucose, Bld: 180 mg/dL — ABNORMAL HIGH (ref 70–99)
Potassium: 3.4 mmol/L — ABNORMAL LOW (ref 3.5–5.1)
Potassium: 3.8 mmol/L (ref 3.5–5.1)
Sodium: 138 mmol/L (ref 135–145)
Sodium: 142 mmol/L (ref 135–145)

## 2024-03-17 LAB — FIBRINOGEN: Fibrinogen: 106 mg/dL — ABNORMAL LOW (ref 210–475)

## 2024-03-17 LAB — CBC WITH DIFFERENTIAL/PLATELET
Abs Immature Granulocytes: 0.87 10*3/uL — ABNORMAL HIGH (ref 0.00–0.07)
Basophils Absolute: 0.1 10*3/uL (ref 0.0–0.1)
Basophils Relative: 1 %
Eosinophils Absolute: 0.2 10*3/uL (ref 0.0–0.5)
Eosinophils Relative: 1 %
HCT: 29.7 % — ABNORMAL LOW (ref 36.0–46.0)
Hemoglobin: 8.3 g/dL — ABNORMAL LOW (ref 12.0–15.0)
Immature Granulocytes: 5 %
Lymphocytes Relative: 36 %
Lymphs Abs: 7 10*3/uL — ABNORMAL HIGH (ref 0.7–4.0)
MCH: 27.9 pg (ref 26.0–34.0)
MCHC: 27.9 g/dL — ABNORMAL LOW (ref 30.0–36.0)
MCV: 99.7 fL (ref 80.0–100.0)
Monocytes Absolute: 1.1 10*3/uL — ABNORMAL HIGH (ref 0.1–1.0)
Monocytes Relative: 6 %
Neutro Abs: 10.2 10*3/uL — ABNORMAL HIGH (ref 1.7–7.7)
Neutrophils Relative %: 51 %
Platelets: 311 10*3/uL (ref 150–400)
RBC: 2.98 MIL/uL — ABNORMAL LOW (ref 3.87–5.11)
RDW: 14.6 % (ref 11.5–15.5)
WBC: 19.4 10*3/uL — ABNORMAL HIGH (ref 4.0–10.5)
nRBC: 0.4 % — ABNORMAL HIGH (ref 0.0–0.2)

## 2024-03-17 LAB — CBC
HCT: 43.2 % (ref 36.0–46.0)
Hemoglobin: 14.6 g/dL (ref 12.0–15.0)
MCH: 29.7 pg (ref 26.0–34.0)
MCHC: 33.8 g/dL (ref 30.0–36.0)
MCV: 88 fL (ref 80.0–100.0)
Platelets: 87 10*3/uL — ABNORMAL LOW (ref 150–400)
RBC: 4.91 MIL/uL (ref 3.87–5.11)
RDW: 17.6 % — ABNORMAL HIGH (ref 11.5–15.5)
WBC: 31.9 10*3/uL — ABNORMAL HIGH (ref 4.0–10.5)
nRBC: 0.2 % (ref 0.0–0.2)

## 2024-03-17 LAB — BETA-HYDROXYBUTYRIC ACID
Beta-Hydroxybutyric Acid: 0.17 mmol/L (ref 0.05–0.27)
Beta-Hydroxybutyric Acid: 0.17 mmol/L (ref 0.05–0.27)
Beta-Hydroxybutyric Acid: 0.1 mmol/L (ref 0.05–0.27)

## 2024-03-17 LAB — COMPREHENSIVE METABOLIC PANEL WITH GFR
ALT: 22 U/L (ref 0–44)
AST: 24 U/L (ref 15–41)
Albumin: 3 g/dL — ABNORMAL LOW (ref 3.5–5.0)
Alkaline Phosphatase: 44 U/L (ref 38–126)
Anion gap: 25 — ABNORMAL HIGH (ref 5–15)
BUN: 8 mg/dL (ref 6–20)
CO2: 11 mmol/L — ABNORMAL LOW (ref 22–32)
Calcium: 8.7 mg/dL — ABNORMAL LOW (ref 8.9–10.3)
Chloride: 104 mmol/L (ref 98–111)
Creatinine, Ser: 1.25 mg/dL — ABNORMAL HIGH (ref 0.44–1.00)
GFR, Estimated: 59 mL/min — ABNORMAL LOW (ref >60)
Glucose, Bld: 470 mg/dL — ABNORMAL HIGH (ref 70–99)
Potassium: 3.5 mmol/L (ref 3.5–5.1)
Sodium: 140 mmol/L (ref 135–145)
Total Bilirubin: 0.7 mg/dL (ref 0.0–1.2)
Total Protein: 5.3 g/dL — ABNORMAL LOW (ref 6.5–8.1)

## 2024-03-17 LAB — RAPID URINE DRUG SCREEN, HOSP PERFORMED
Amphetamines: NOT DETECTED
Barbiturates: NOT DETECTED
Benzodiazepines: NOT DETECTED
Cocaine: NOT DETECTED
Opiates: NOT DETECTED
Tetrahydrocannabinol: POSITIVE — AB

## 2024-03-17 LAB — MAGNESIUM: Magnesium: 1.5 mg/dL — ABNORMAL LOW (ref 1.7–2.4)

## 2024-03-17 LAB — GLUCOSE, CAPILLARY
Glucose-Capillary: 215 mg/dL — ABNORMAL HIGH (ref 70–99)
Glucose-Capillary: 107 mg/dL — ABNORMAL HIGH (ref 70–99)
Glucose-Capillary: 103 mg/dL — ABNORMAL HIGH (ref 70–99)

## 2024-03-17 LAB — PROTIME-INR
INR: 2.1 — ABNORMAL HIGH (ref 0.8–1.2)
Prothrombin Time: 24.9 s — ABNORMAL HIGH (ref 11.4–15.2)

## 2024-03-17 LAB — MRSA NEXT GEN BY PCR, NASAL: MRSA by PCR Next Gen: NOT DETECTED

## 2024-03-17 LAB — BLOOD GAS, VENOUS
Acid-base deficit: 23.7 mmol/L — ABNORMAL HIGH (ref 0.0–2.0)
Bicarbonate: 9.7 mmol/L — ABNORMAL LOW (ref 20.0–28.0)
O2 Saturation: 13.1 %
Patient temperature: 37
pCO2, Ven: 52 mmHg (ref 44–60)
pH, Ven: <6.95 — CL (ref 7.25–7.43)
pO2, Ven: <31 mmHg — CL (ref 32–45)

## 2024-03-17 LAB — TROPONIN I (HIGH SENSITIVITY)
Troponin I (High Sensitivity): 296 ng/L (ref <18)
Troponin I (High Sensitivity): 1197 ng/L (ref <18)

## 2024-03-17 LAB — ETHANOL: Alcohol, Ethyl (B): <15 mg/dL (ref <15)

## 2024-03-17 LAB — CBG MONITORING, ED: Glucose-Capillary: 367 mg/dL — ABNORMAL HIGH (ref 70–99)

## 2024-03-17 LAB — HCG, SERUM, QUALITATIVE: Preg, Serum: NEGATIVE

## 2024-03-17 LAB — PREPARE RBC (CROSSMATCH)

## 2024-03-17 LAB — TSH: TSH: 9.037 u[IU]/mL — ABNORMAL HIGH (ref 0.350–4.500)

## 2024-03-17 LAB — PHOSPHORUS: Phosphorus: 4.6 mg/dL (ref 2.5–4.6)

## 2024-03-17 LAB — ABO/RH: ABO/RH(D): O POS

## 2024-03-17 LAB — HIV ANTIBODY (ROUTINE TESTING W REFLEX): HIV Screen 4th Generation wRfx: NONREACTIVE

## 2024-03-17 SURGERY — LAPAROTOMY, EXPLORATORY
Anesthesia: General

## 2024-03-17 MED ORDER — FENTANYL BOLUS VIA INFUSION
25.0000 ug | INTRAVENOUS | Status: DC | PRN
  Administered 2024-03-18: 100 ug via INTRAVENOUS
  Administered 2024-03-18 (×3): 50 ug via INTRAVENOUS
  Administered 2024-03-18: 100 ug via INTRAVENOUS
  Administered 2024-03-18: 50 ug via INTRAVENOUS

## 2024-03-17 MED ORDER — PROPOFOL 10 MG/ML IV BOLUS
INTRAVENOUS | Status: AC
  Filled 2024-03-17: qty 20

## 2024-03-17 MED ORDER — SODIUM CHLORIDE 0.9% FLUSH: 10.0000 mL | INTRAVENOUS | Status: DC | PRN

## 2024-03-17 MED ORDER — PANTOPRAZOLE SODIUM 40 MG IV SOLR
40.0000 mg | Freq: Every day | INTRAVENOUS | Status: DC
  Administered 2024-03-17 – 2024-03-24 (×8): 40 mg via INTRAVENOUS
  Filled 2024-03-17 (×8): qty 10

## 2024-03-17 MED ORDER — POLYETHYLENE GLYCOL 3350 17 G PO PACK: 17.0000 g | PACK | Freq: Every day | ORAL | Status: DC | PRN

## 2024-03-17 MED ORDER — LORAZEPAM 2 MG/ML IJ SOLN
1.0000 mg | Freq: Once | INTRAMUSCULAR | Status: AC
  Administered 2024-03-17: 1 mg via INTRAVENOUS
  Filled 2024-03-17: qty 1

## 2024-03-17 MED ORDER — DEXTROSE 50 % IV SOLN: 0.0000 mL | INTRAVENOUS | Status: DC | PRN

## 2024-03-17 MED ORDER — SUCCINYLCHOLINE CHLORIDE 20 MG/ML IJ SOLN
INTRAMUSCULAR | Status: AC | PRN
  Administered 2024-03-17: 100 mg via INTRAVENOUS

## 2024-03-17 MED ORDER — SODIUM BICARBONATE 8.4 % IV SOLN
INTRAVENOUS | Status: AC | PRN
  Administered 2024-03-17: 100 meq via INTRAVENOUS

## 2024-03-17 MED ORDER — VANCOMYCIN HCL IN DEXTROSE 1-5 GM/200ML-% IV SOLN
1000.0000 mg | INTRAVENOUS | Status: DC
  Administered 2024-03-18: 1000 mg via INTRAVENOUS
  Filled 2024-03-17: qty 200

## 2024-03-17 MED ORDER — SODIUM CHLORIDE 0.9% IV SOLUTION: Freq: Once | INTRAVENOUS | Status: DC

## 2024-03-17 MED ORDER — IOHEXOL 300 MG/ML  SOLN
100.0000 mL | Freq: Once | INTRAMUSCULAR | Status: AC | PRN
  Administered 2024-03-17: 100 mL via INTRAVENOUS

## 2024-03-17 MED ORDER — METRONIDAZOLE 500 MG/100ML IV SOLN
500.0000 mg | Freq: Two times a day (BID) | INTRAVENOUS | Status: DC
  Administered 2024-03-17 – 2024-03-18 (×2): 500 mg via INTRAVENOUS
  Filled 2024-03-17 (×2): qty 100

## 2024-03-17 MED ORDER — VASOPRESSIN 20 UNIT/ML IV SOLN
INTRAVENOUS | Status: DC | PRN
Start: 2024-03-17 — End: 2024-03-17
  Administered 2024-03-17: 5 [IU] via INTRAVENOUS

## 2024-03-17 MED ORDER — PROPOFOL 1000 MG/100ML IV EMUL
0.0000 ug/kg/min | INTRAVENOUS | Status: DC
  Administered 2024-03-17: 50 ug/kg/min via INTRAVENOUS
  Administered 2024-03-17: 5 ug/kg/min via INTRAVENOUS
  Administered 2024-03-18: 50 ug/kg/min via INTRAVENOUS
  Administered 2024-03-18: 70 ug/kg/min via INTRAVENOUS
  Filled 2024-03-17 (×4): qty 100

## 2024-03-17 MED ORDER — SODIUM CHLORIDE 0.9% FLUSH
10.0000 mL | Freq: Two times a day (BID) | INTRAVENOUS | Status: DC
  Administered 2024-03-17: 10 mL
  Administered 2024-03-18: 20 mL
  Administered 2024-03-18 – 2024-03-24 (×9): 10 mL

## 2024-03-17 MED ORDER — SODIUM CHLORIDE 0.9 % IV BOLUS
1000.0000 mL | Freq: Once | INTRAVENOUS | Status: AC
  Administered 2024-03-17: 1000 mL via INTRAVENOUS

## 2024-03-17 MED ORDER — SODIUM BICARBONATE 8.4 % IV SOLN
INTRAVENOUS | Status: DC | PRN
  Administered 2024-03-17 (×2): 50 meq via INTRAVENOUS

## 2024-03-17 MED ORDER — SODIUM CHLORIDE 0.9 % IV SOLN: 250.0000 mL | INTRAVENOUS | Status: AC

## 2024-03-17 MED ORDER — SODIUM CHLORIDE (PF) 0.9 % IJ SOLN
INTRAMUSCULAR | Status: AC
Start: 2024-03-17 — End: 2024-03-17
  Filled 2024-03-17: qty 10

## 2024-03-17 MED ORDER — POTASSIUM CHLORIDE 10 MEQ/100ML IV SOLN
INTRAVENOUS | Status: AC
  Administered 2024-03-18: 10 meq
  Filled 2024-03-17: qty 300

## 2024-03-17 MED ORDER — LACTATED RINGERS IV BOLUS
1000.0000 mL | Freq: Once | INTRAVENOUS | Status: AC
  Administered 2024-03-17: 1000 mL via INTRAVENOUS

## 2024-03-17 MED ORDER — INSULIN ASPART 100 UNIT/ML IJ SOLN: 0.0000 [IU] | INTRAMUSCULAR | Status: DC

## 2024-03-17 MED ORDER — VANCOMYCIN HCL IN DEXTROSE 1-5 GM/200ML-% IV SOLN
1000.0000 mg | Freq: Once | INTRAVENOUS | Status: AC
  Administered 2024-03-17: 1000 mg via INTRAVENOUS
  Filled 2024-03-17: qty 200

## 2024-03-17 MED ORDER — SODIUM BICARBONATE 8.4 % IV SOLN
INTRAVENOUS | Status: AC
  Filled 2024-03-17: qty 50

## 2024-03-17 MED ORDER — INSULIN REGULAR(HUMAN) IN NACL 100-0.9 UT/100ML-% IV SOLN
INTRAVENOUS | Status: DC
Start: 2024-03-17 — End: 2024-03-17

## 2024-03-17 MED ORDER — ETOMIDATE 2 MG/ML IV SOLN
INTRAVENOUS | Status: AC | PRN
  Administered 2024-03-17: 10 mg via INTRAVENOUS

## 2024-03-17 MED ORDER — CALCIUM CHLORIDE 10 % IV SOLN
INTRAVENOUS | Status: DC | PRN
  Administered 2024-03-17: .5 g via INTRAVENOUS

## 2024-03-17 MED ORDER — SODIUM CHLORIDE 0.9 % IV SOLN: 10.0000 mL/h | Freq: Once | INTRAVENOUS | Status: DC

## 2024-03-17 MED ORDER — SODIUM CHLORIDE 0.9 % IV SOLN: INTRAVENOUS | Status: DC | PRN

## 2024-03-17 MED ORDER — FENTANYL CITRATE PF 50 MCG/ML IJ SOSY
100.0000 ug | PREFILLED_SYRINGE | Freq: Once | INTRAMUSCULAR | Status: AC
  Administered 2024-03-17: 100 ug via INTRAVENOUS
  Filled 2024-03-17: qty 2

## 2024-03-17 MED ORDER — FENTANYL CITRATE PF 50 MCG/ML IJ SOSY: 25.0000 ug | PREFILLED_SYRINGE | Freq: Once | INTRAMUSCULAR | Status: DC

## 2024-03-17 MED ORDER — ROCURONIUM BROMIDE 10 MG/ML (PF) SYRINGE
PREFILLED_SYRINGE | INTRAVENOUS | Status: DC
Start: 2024-03-17 — End: 2024-03-17
  Filled 2024-03-17: qty 10

## 2024-03-17 MED ORDER — FENTANYL CITRATE (PF) 250 MCG/5ML IJ SOLN
INTRAMUSCULAR | Status: DC | PRN
  Administered 2024-03-17 (×3): 50 ug via INTRAVENOUS

## 2024-03-17 MED ORDER — PIPERACILLIN-TAZOBACTAM 3.375 G IVPB 30 MIN
3.3750 g | Freq: Once | INTRAVENOUS | Status: AC
Start: 2024-03-17 — End: 2024-03-17
  Administered 2024-03-17: 3.375 g via INTRAVENOUS
  Filled 2024-03-17: qty 50

## 2024-03-17 MED ORDER — FENTANYL 2500MCG IN NS 250ML (10MCG/ML) PREMIX INFUSION
0.0000 ug/h | INTRAVENOUS | Status: DC
  Administered 2024-03-17: 25 ug/h via INTRAVENOUS
  Administered 2024-03-18: 100 ug/h via INTRAVENOUS
  Filled 2024-03-17 (×2): qty 250

## 2024-03-17 MED ORDER — ORAL CARE MOUTH RINSE
15.0000 mL | OROMUCOSAL | Status: DC
  Administered 2024-03-17 – 2024-03-18 (×6): 15 mL via OROMUCOSAL

## 2024-03-17 MED ORDER — MAGNESIUM SULFATE 50 % IJ SOLN
6.0000 g | Freq: Once | INTRAVENOUS | Status: AC
  Administered 2024-03-17: 6 g via INTRAVENOUS
  Filled 2024-03-17: qty 10

## 2024-03-17 MED ORDER — 0.9 % SODIUM CHLORIDE (POUR BTL) OPTIME
TOPICAL | Status: DC | PRN
  Administered 2024-03-17: 1000 mL
  Administered 2024-03-17 (×2): 2000 mL
  Administered 2024-03-17: 1000 mL

## 2024-03-17 MED ORDER — ORAL CARE MOUTH RINSE: 15.0000 mL | OROMUCOSAL | Status: DC | PRN

## 2024-03-17 MED ORDER — NOREPINEPHRINE 4 MG/250ML-% IV SOLN
0.0000 ug/min | INTRAVENOUS | Status: DC
  Filled 2024-03-17: qty 250

## 2024-03-17 MED ORDER — VASOPRESSIN 20 UNIT/ML IV SOLN
INTRAVENOUS | Status: AC
  Filled 2024-03-17: qty 1

## 2024-03-17 MED ORDER — DOCUSATE SODIUM 100 MG PO CAPS: 100.0000 mg | ORAL_CAPSULE | Freq: Two times a day (BID) | ORAL | Status: DC | PRN

## 2024-03-17 MED ORDER — DEXTROSE IN LACTATED RINGERS 5 % IV SOLN: INTRAVENOUS | Status: DC

## 2024-03-17 MED ORDER — LIDOCAINE HCL (PF) 2 % IJ SOLN
INTRAMUSCULAR | Status: AC
  Filled 2024-03-17: qty 5

## 2024-03-17 MED ORDER — SODIUM CHLORIDE (PF) 0.9 % IJ SOLN
INTRAMUSCULAR | Status: AC
Start: 2024-03-17 — End: 2024-03-17
  Filled 2024-03-17: qty 20

## 2024-03-17 MED ORDER — LACTATED RINGERS IV SOLN: INTRAVENOUS | Status: DC

## 2024-03-17 MED ORDER — ROCURONIUM BROMIDE 10 MG/ML (PF) SYRINGE
PREFILLED_SYRINGE | INTRAVENOUS | Status: DC | PRN
  Administered 2024-03-17: 100 mg via INTRAVENOUS

## 2024-03-17 MED ORDER — POLYETHYLENE GLYCOL 3350 17 G PO PACK
17.0000 g | PACK | Freq: Every day | ORAL | Status: DC
  Administered 2024-03-18: 17 g
  Filled 2024-03-17: qty 1

## 2024-03-17 MED ORDER — SUCCINYLCHOLINE CHLORIDE 200 MG/10ML IV SOSY
PREFILLED_SYRINGE | INTRAVENOUS | Status: AC
  Filled 2024-03-17: qty 10

## 2024-03-17 MED ORDER — CHLORHEXIDINE GLUCONATE CLOTH 2 % EX PADS
6.0000 | MEDICATED_PAD | Freq: Every day | CUTANEOUS | Status: DC
Start: 2024-03-17 — End: 2024-03-19
  Administered 2024-03-17 – 2024-03-19 (×2): 6 via TOPICAL

## 2024-03-17 MED ORDER — DOCUSATE SODIUM 50 MG/5ML PO LIQD
100.0000 mg | Freq: Two times a day (BID) | ORAL | Status: DC
  Administered 2024-03-17 – 2024-03-18 (×3): 100 mg
  Filled 2024-03-17 (×3): qty 10

## 2024-03-17 MED ORDER — ROCURONIUM BROMIDE 10 MG/ML (PF) SYRINGE
PREFILLED_SYRINGE | INTRAVENOUS | Status: AC
  Filled 2024-03-17: qty 10

## 2024-03-17 MED ORDER — POTASSIUM CHLORIDE 10 MEQ/100ML IV SOLN
10.0000 meq | INTRAVENOUS | Status: AC
  Administered 2024-03-17 – 2024-03-18 (×4): 10 meq via INTRAVENOUS
  Filled 2024-03-17: qty 100

## 2024-03-17 MED ORDER — SODIUM CHLORIDE 0.9 % IV SOLN
2.0000 g | Freq: Two times a day (BID) | INTRAVENOUS | Status: DC
  Administered 2024-03-17 – 2024-03-18 (×2): 2 g via INTRAVENOUS
  Filled 2024-03-17 (×2): qty 12.5

## 2024-03-17 MED ORDER — FENTANYL CITRATE (PF) 250 MCG/5ML IJ SOLN
INTRAMUSCULAR | Status: AC
  Filled 2024-03-17: qty 5

## 2024-03-17 MED ORDER — ETOMIDATE 2 MG/ML IV SOLN
INTRAVENOUS | Status: AC
  Filled 2024-03-17: qty 20

## 2024-03-17 SURGICAL SUPPLY — 43 items
BAG COUNTER SPONGE SURGICOUNT (BAG) IMPLANT
BLADE EXTENDED COATED 6.5IN (ELECTRODE) IMPLANT
CELLS DAT CNTRL 66122 CELL SVR (MISCELLANEOUS) IMPLANT
CHLORAPREP W/TINT 26 (MISCELLANEOUS) ×2 IMPLANT
DRAIN CHANNEL 19F RND (DRAIN) IMPLANT
DRAPE LAPAROSCOPIC ABDOMINAL (DRAPES) ×2 IMPLANT
DRAPE SHEET LG 3/4 BI-LAMINATE (DRAPES) IMPLANT
DRSG OPSITE POSTOP 4X10 (GAUZE/BANDAGES/DRESSINGS) IMPLANT
DRSG OPSITE POSTOP 4X12 (GAUZE/BANDAGES/DRESSINGS) IMPLANT
DRSG OPSITE POSTOP 4X6 (GAUZE/BANDAGES/DRESSINGS) IMPLANT
DRSG OPSITE POSTOP 4X8 (GAUZE/BANDAGES/DRESSINGS) IMPLANT
ELECT REM PT RETURN 15FT ADLT (MISCELLANEOUS) ×2 IMPLANT
EVACUATOR SILICONE 100CC (DRAIN) IMPLANT
GAUZE SPONGE 4X4 12PLY STRL (GAUZE/BANDAGES/DRESSINGS) IMPLANT
GLOVE BIO SURGEON STRL SZ 6.5 (GLOVE) ×4 IMPLANT
GLOVE INDICATOR 6.5 STRL GRN (GLOVE) ×6 IMPLANT
GOWN STRL REUS W/ TWL XL LVL3 (GOWN DISPOSABLE) ×6 IMPLANT
HANDLE SUCTION POOLE (INSTRUMENTS) IMPLANT
KIT TURNOVER KIT A (KITS) ×2 IMPLANT
LEGGING LITHOTOMY PAIR STRL (DRAPES) IMPLANT
LIGASURE IMPACT 36 18CM CVD LR (INSTRUMENTS) IMPLANT
PACK COLON (CUSTOM PROCEDURE TRAY) ×2 IMPLANT
PENCIL SMOKE EVACUATOR (MISCELLANEOUS) IMPLANT
RELOAD PROXIMATE 75MM BLUE (ENDOMECHANICALS) ×1 IMPLANT
RELOAD STAPLE 75 3.8 BLU REG (ENDOMECHANICALS) IMPLANT
RETRACTOR WND ALEXIS 18 MED (MISCELLANEOUS) IMPLANT
RETRACTOR WND ALEXIS 25 LRG (MISCELLANEOUS) IMPLANT
SPONGE T-LAP 18X18 ~~LOC~~+RFID (SPONGE) IMPLANT
STAPLER SKIN PROX 35W (STAPLE) ×2 IMPLANT
SUT ETHILON 3 0 PS 1 (SUTURE) IMPLANT
SUT NOVA 1 T20/GS 25DT (SUTURE) ×4 IMPLANT
SUT PDS AB 1 TP1 96 (SUTURE) IMPLANT
SUT SILK 2 0 SH CR/8 (SUTURE) ×2 IMPLANT
SUT SILK 2-0 18XBRD TIE 12 (SUTURE) ×2 IMPLANT
SUT SILK 3 0 SH CR/8 (SUTURE) ×2 IMPLANT
SUT SILK 3-0 18XBRD TIE 12 (SUTURE) ×2 IMPLANT
SUT VIC AB 0 CT1 27XBRD ANTBC (SUTURE) IMPLANT
SUT VIC AB 2-0 SH 18 (SUTURE) ×2 IMPLANT
SUT VIC AB 2-0 SH 27X BRD (SUTURE) IMPLANT
SUTURE V-LC BRB 180 2/0GR6GS22 (SUTURE) IMPLANT
TOWEL OR 17X26 10 PK STRL BLUE (TOWEL DISPOSABLE) IMPLANT
TRAY FOLEY MTR SLVR 16FR STAT (SET/KITS/TRAYS/PACK) ×2 IMPLANT
TUBING CONNECTING 10 (TUBING) ×4 IMPLANT

## 2024-03-17 NOTE — Progress Notes (Signed)
Notified Lab that ABG being sent for analysis. 

## 2024-03-17 NOTE — Addendum Note (Signed)
 Addendum  created 03/17/24 2016 by Niels Marien CROME, MD   Child order released for a procedure order, Clinical Note Signed, Intraprocedure Blocks edited, LDA created via procedure documentation, SmartForm saved

## 2024-03-17 NOTE — Progress Notes (Signed)
 Pharmacy Antibiotic Note  [name] is a 33 y.o. adult admitted on 03/17/2024 with hemorrhagic vs septic shock.  Pharmacy has been consulted for vancomycin dosing. Pt intubated in ED, on peripheral vasopressors. WBC 19.4, hypothermic SCr 1.25> 1.54. Lactate > 15  Plan: Zosyn 3.375 given in ED @ 1330 Vancomycin 1 gm given in ED @ 1329 Vancomycin 1 gm IV q24 for eAUC 500.6 using SCf 1.54, Css min 13 Cefepime 2 gm IV q12hr Flagyl  500 mg IV q12hr  Height: 5' 2 (157.5 cm) Weight: 70.3 kg (155 lb) IBW/kg (Calculated) : 50.1  Temp (24hrs), Avg:95 F (35 C), Min:95 F (35 C), Max:95 F (35 C)  Recent Labs  Lab 03/17/24 1239 03/17/24 1248 03/17/24 1515 03/17/24 1524  WBC 19.4*  --   --   --   CREATININE 1.25*  --  1.54*  --   LATICACIDVEN  --  >15.0*  --  14.8*    Estimated Creatinine Clearance (by C-G formula based on SCr of 1.54 mg/dL (H)) Female: 50.8 mL/min (A) Female: 60.4 mL/min (A)    No Known Allergies  Antimicrobials this admission: 7/2 zosyn x 1 7/2 vancomycin>> 7/2 cefepime> 7/2 flagyl  > Dose adjustments this admission:  Microbiology results: 7/2 Urine Cx: 7/2 BCx2:   Thank you for allowing pharmacy to be a part of this patient's care.  Rosaline IVAR Edison, Pharm.D Use secure chat for questions 03/17/2024 4:47 PM

## 2024-03-17 NOTE — Inpatient Diabetes Management (Signed)
 Inpatient Diabetes Program Recommendations  AACE/ADA: New Consensus Statement on Inpatient Glycemic Control (2015)  Target Ranges:  Prepandial:   less than 140 mg/dL      Peak postprandial:   less than 180 mg/dL (1-2 hours)      Critically ill patients:  140 - 180 mg/dL   Lab Results  Component Value Date   GLUCAP 367 (H) 03/17/2024    Review of Glycemic Control  Diabetes history: New-onset DM  Outpatient Diabetes medications: None Current orders for Inpatient glycemic control: IV insulin per EndoTool for DKA  HgbA1C pending Intubated and sedated  Inpatient Diabetes Program Recommendations:    IV insulin per EndoTool for DKA  Will speak with pt regarding new diabetes diagnosis when appropriate. Awaiting HgbA1C results.  Continue to follow.  Thank you. Shona Brandy, RD, LDN, CDCES Inpatient Diabetes Coordinator [phone]

## 2024-03-17 NOTE — Anesthesia Procedure Notes (Signed)
 Arterial Line Insertion Start/End7/10/2023 3:48 PM, 03/17/2024 3:50 PM Performed by: Niels Marien CROME, MD, anesthesiologist  Patient location: OR. Preanesthetic checklist: patient identified, IV checked, risks and benefits discussed, surgical consent, monitors and equipment checked, pre-op evaluation and timeout performed Left, radial was placed Catheter size: 20 G Hand hygiene performed  Allen's test indicative of satisfactory collateral circulation Attempts: 1 Procedure performed using ultrasound guided (image not available) technique. Following insertion, dressing applied and Biopatch. Post procedure assessment: normal and unchanged  Patient tolerated the procedure well with no immediate complications.

## 2024-03-17 NOTE — ED Notes (Signed)
 Ran ISTAT, gave print out of lactic acid to Dr Levander and informed Garrel, her nurse.

## 2024-03-17 NOTE — Anesthesia Postprocedure Evaluation (Signed)
 Anesthesia Post Note  Patient: [name]  Procedure(s) Performed: LAPAROTOMY, EXPLORATORY, myomectomy     Patient location during evaluation: ICU Anesthesia Type: General Level of consciousness: patient remains intubated per anesthesia plan Pain management: pain level controlled Vital Signs Assessment: post-procedure vital signs reviewed and stable Respiratory status: patient remains intubated per anesthesia plan Cardiovascular status: blood pressure returned to baseline and stable Postop Assessment: no headache and no backache Anesthetic complications: no   No notable events documented.  Last Vitals:  Vitals:   03/17/24 1322 03/17/24 1513  BP: 92/74 (!) 78/46  Pulse:  (!) 115  Resp:  (!) 30  Temp:  (!) 35 C  SpO2:  100%    Last Pain: There were no vitals filed for this visit.               Kira Hartl L Mcgwire Dasaro

## 2024-03-17 NOTE — ED Triage Notes (Signed)
 Pt BIBA for sudden onset abd pain and cramping. Hx hashimoto, has not had meds in 7 years d/t lack of insurance but had not had probl;ems until now. Hx anxiety. CBG 398, no hx dm. Hyperventilating with ems, intermittently passing out. On menstrual. 4 zofran , 500cc NS, pulled out iv  110/70 HR 130

## 2024-03-17 NOTE — Anesthesia Procedure Notes (Signed)
 Central Venous Catheter Insertion Performed by: Epifanio Fallow, MD, anesthesiologist Start/End7/10/2023 4:00 PM, 03/17/2024 4:05 PM Patient location: Pre-op. Preanesthetic checklist: patient identified, IV checked, site marked, risks and benefits discussed, surgical consent, monitors and equipment checked, pre-op evaluation, timeout performed and anesthesia consent Position: Trendelenburg Lidocaine 1% used for infiltration and patient sedated Hand hygiene performed , maximum sterile barriers used  and Seldinger technique used Catheter size: 8 Fr Total catheter length 16. Central line was placed.Double lumen Procedure performed using ultrasound guided technique. Ultrasound Notes:anatomy identified, needle tip was noted to be adjacent to the nerve/plexus identified, no ultrasound evidence of intravascular and/or intraneural injection and image(s) printed for medical record Attempts: 1 Following insertion, dressing applied, line sutured and Biopatch. Post procedure assessment: blood return through all ports  Patient tolerated the procedure well with no immediate complications.

## 2024-03-17 NOTE — H&P (Signed)
 NAME:  [name], MRN:  [id number], DOB:  1990-12-25, LOS: 0 ADMISSION DATE:  03/17/2024, CONSULTATION DATE:  03/17/24 REFERRING MD:  Levander, CHIEF COMPLAINT:  abdominal pain   History of Present Illness:  [name] is a 33 y.o. F with PMH significant for Hashimoto's thyroiditis off medication for several years, schizoaffective disorder and uterine fibroids who presented to the ED with severe lower abdominal pain and agitation.  Per history provided to the ED by husband pt has been in her usual state of health until the morning of admission when she called c/o lower abdominal pain, on menstrual cycle.  She was agitated to the point pt required intubation in the ED, was transiently hypotensive but improved with IVF.     Labs revealed lactic acid >15, WBC 19k, glucose 367, Hcg negative, metabolic panel and CT chest/abd/pelvis pending.  PCCM consulted for admission   Pertinent  Medical History   has a past medical history of Amenorrhea, secondary, Bipolar disorder (HCC), Galactorrhea, Hirsutism, Hyperprolactinemia (HCC), Hypothyroidism, Hypothyroidism, acquired, autoimmune, Pituitary tumor, and Thyroiditis, autoimmune.   Significant Hospital Events: Including procedures, antibiotic start and stop dates in addition to other pertinent events   7/2 altered and hyperglycemic, intubated, ICU admission  Interim History / Subjective:  Pt just intubated in the ED, hemodynamically stable  Objective    Blood pressure 92/74, pulse (!) 109, resp. rate 18, height 5' 2 (1.575 m), SpO2 98%.       No intake or output data in the 24 hours ending 03/17/24 1341 There were no vitals filed for this visit.  General:  well nourished young F intubated and sedated HEENT: MM pink/moist, ETT in place, sclera anicteric PERRLA Neuro: just received RSI medications, paralyzed CV: s1s2 rrr, no m/r/g PULM:  clear bilaterally on mechanical support  GI: soft, firm, mildly distended  lower abdomen Extremities:  warm/dry, no edema     Resolved problem list   Assessment and Plan    Acute Encephalopathy requiring intubation for airway protection Abdominal pain Lactic acidosis with leukocytosis, concern for sepsis History of Fibroids -unclear etiology, CT chest/abd/pelvis pending, perhaps all new onset DM with DKA -receiving 30cc/kg IVF -fentanyl  and propofol for PAD protocol  -blood and urine cultures sent -broad spectrum abx with Vanc/Zosyn, trend lactate  --Maintain full vent support with SAT/SBT as tolerated -titrate Vent setting to maintain SpO2 greater than or equal to 90%. -HOB elevated 30 degrees. -Plateau pressures less than 30 cm H20.  -Follow chest x-ray, ABG prn.   -Bronchial hygiene and RT/bronchodilator protocol.    Hyperglycemia, appears to be new onset DM with DKA  -start Endotool -q4hr BMP -pH 6.9 got two amps of bicarb peri-intubation -Hg A1c -DM coordinator consult -NPO   AKI -creatinine 1.25, last labs several years ago but normal renal function at that time -follow renal indices after IVF, monitor UOP and avoid nephrotoxins   History of Hypothyroidism  -TSH pending    Schizoaffective Disorder  -several psych admissions, doesn't appear that she has a hx of SI, not on home medications   Best Practice (right click and Reselect all SmartList Selections daily)   Diet/type: NPO DVT prophylaxis SCD Pressure ulcer(s): N/A GI prophylaxis: PPI Lines: N/A Foley:  N/A Code Status:  full code Last date of multidisciplinary goals of care discussion [pending ]  Labs   CBC: Recent Labs  Lab 03/17/24 1239  WBC 19.4*  NEUTROABS 10.2*  HGB 8.3*  HCT 29.7*  MCV 99.7  PLT 311  Basic Metabolic Panel: No results for input(s): NA, K, CL, CO2, GLUCOSE, BUN, CREATININE, CALCIUM, MG, PHOS in the last 168 hours. GFR: CrCl cannot be calculated (Patient's most recent lab result is older than the maximum 21 days allowed.). Recent Labs   Lab 03/17/24 1239 03/17/24 1248  WBC 19.4*  --   LATICACIDVEN  --  >15.0*    Liver Function Tests: No results for input(s): AST, ALT, ALKPHOS, BILITOT, PROT, ALBUMIN in the last 168 hours. No results for input(s): LIPASE, AMYLASE in the last 168 hours. No results for input(s): AMMONIA in the last 168 hours.  ABG    Component Value Date/Time   HCO3 9.7 (L) 03/17/2024 1239   TCO2 24 08/03/2008 1029   ACIDBASEDEF 23.7 (H) 03/17/2024 1239   O2SAT 13.1 03/17/2024 1239     Coagulation Profile: No results for input(s): INR, PROTIME in the last 168 hours.  Cardiac Enzymes: No results for input(s): CKTOTAL, CKMB, CKMBINDEX, TROPONINI in the last 168 hours.  HbA1C: No results found for: HGBA1C  CBG: Recent Labs  Lab 03/17/24 1226  GLUCAP 367*    Review of Systems:   Unable to obtain   Past Medical History:  [name],  has a past medical history of Amenorrhea, secondary, Bipolar disorder (HCC), Galactorrhea, Hirsutism, Hyperprolactinemia (HCC), Hypothyroidism, Hypothyroidism, acquired, autoimmune, Pituitary tumor, and Thyroiditis, autoimmune.   Surgical History:   Past Surgical History:  Procedure Laterality Date   None       Social History:   reports that [name] is a non-smoker but has been exposed to tobacco smoke. [name] has never used smokeless tobacco. [name] reports current alcohol use. Charlie reports current drug use. Drug: Marijuana.   Family History:  Charlie's family history includes Anemia in Charlie's father; Cancer in Charlie's maternal grandfather; Diabetes in Charlie's maternal grandfather and paternal grandfather; Hypertension in Charlie's father; Obesity in Charlie's mother and paternal aunt. There is no history of Thyroid  disease, Kidney disease, or Bipolar disorder.   Allergies No Known Allergies   Home Medications  Prior to Admission medications   Not on File     Critical care time:  45 minutes       CRITICAL CARE Performed by: Leita SAUNDERS Durelle Zepeda   Total critical care time: 45 minutes  Critical care time was exclusive of separately billable procedures and treating other patients.  Critical care was necessary to treat or prevent imminent or life-threatening deterioration.  Critical care was time spent personally by me on the following activities: development of treatment plan with patient and/or surrogate as well as nursing, discussions with consultants, evaluation of patient's response to treatment, examination of patient, obtaining history from patient or surrogate, ordering and performing treatments and interventions, ordering and review of laboratory studies, ordering and review of radiographic studies, pulse oximetry and re-evaluation of patient's condition.    Leita SAUNDERS Kailyn Dubie, PA-C [facility] See Amion for pager If no response to pager , please call [phone] until 7pm After 7:00 pm call Elink  [phone]

## 2024-03-17 NOTE — ED Provider Notes (Addendum)
 [place] EMERGENCY DEPARTMENT AT [facility] Provider Note   CSN: [id number] Arrival date & time: 03/17/24  1205     Patient presents with: Hyperglycemia and Abdominal Pain   [name] is a 33 y.o. adult.   Patient BIB EMS, called by husband who reports the patient called his while he was at work with complaint of abdominal pain extending in to the chest. He reports she was hyperventilating at the time of the call and was very upset. She was in her usual state of health this morning when he left for work. No unusual symptoms yesterday.   The history is provided by the patient, the EMS personnel and the spouse. No language interpreter was used.  Hyperglycemia Associated symptoms: abdominal pain   Abdominal Pain      Prior to Admission medications   Not on File    Allergies: Patient has no known allergies.    Review of Systems  Gastrointestinal:  Positive for abdominal pain.    Updated Vital Signs BP 92/74 (BP Location: Left Arm)   Pulse (!) 109   Resp 18   Ht 5' 2 (1.575 m)   SpO2 98%   BMI 23.05 kg/m   Physical Exam Constitutional:      Appearance: Michele is well-developed. Michele is ill-appearing and diaphoretic.     Comments: Screaming, altered, agitated. When directable she answers in oriented responses. Hyperventilating, combative. Soft restraints ordered for patient and staff safety.   HENT:     Mouth/Throat:     Comments: Lips and oral cavity very dry Eyes:     Pupils: Pupils are equal, round, and reactive to light.  Cardiovascular:     Rate and Rhythm: Tachycardia present.  Pulmonary:     Effort: Pulmonary effort is normal.     Breath sounds: No wheezing, rhonchi or rales.  Musculoskeletal:        General: Normal range of motion.     Cervical back: Normal range of motion.  Skin:    General: Skin is warm.     Coloration: Skin is pale.  Neurological:     Comments: Minimally directable. Responds to husband intermittently.      (all labs ordered are listed, but only abnormal results are displayed) Labs Reviewed  CBC WITH DIFFERENTIAL/PLATELET - Abnormal; Notable for the following components:      Result Value   WBC 19.4 (*)    RBC 2.98 (*)    Hemoglobin 8.3 (*)    HCT 29.7 (*)    MCHC 27.9 (*)    nRBC 0.4 (*)    Neutro Abs 10.2 (*)    Lymphs Abs 7.0 (*)    Monocytes Absolute 1.1 (*)    Abs Immature Granulocytes 0.87 (*)    All other components within normal limits  COMPREHENSIVE METABOLIC PANEL WITH GFR - Abnormal; Notable for the following components:   CO2 11 (*)    Glucose, Bld 470 (*)    Creatinine, Ser 1.25 (*)    Calcium 8.7 (*)    Total Protein 5.3 (*)    Albumin 3.0 (*)    GFR, Estimated 59 (*)    Anion gap 25 (*)    All other components within normal limits  BLOOD GAS, VENOUS - Abnormal; Notable for the following components:   pH, Ven <6.95 (*)    pO2, Ven <31 (*)    Bicarbonate 9.7 (*)    Acid-base deficit 23.7 (*)    All other components within normal  limits  CBG MONITORING, ED - Abnormal; Notable for the following components:   Glucose-Capillary 367 (*)    All other components within normal limits  I-STAT CG4 LACTIC ACID, ED - Abnormal; Notable for the following components:   Lactic Acid, Venous >15.0 (*)    All other components within normal limits  TROPONIN I (HIGH SENSITIVITY) - Abnormal; Notable for the following components:   Troponin I (High Sensitivity) 296 (*)    All other components within normal limits  CULTURE, BLOOD (ROUTINE X 2)  CULTURE, BLOOD (ROUTINE X 2)  URINE CULTURE  MRSA NEXT GEN BY PCR, NASAL  ETHANOL  HCG, SERUM, QUALITATIVE  RAPID URINE DRUG SCREEN, HOSP PERFORMED  URINALYSIS, ROUTINE W REFLEX MICROSCOPIC  TSH  BETA-HYDROXYBUTYRIC ACID  BLOOD GAS, ARTERIAL  HIV ANTIBODY (ROUTINE TESTING W REFLEX)  BASIC METABOLIC PANEL WITH GFR  BASIC METABOLIC PANEL WITH GFR  BASIC METABOLIC PANEL WITH GFR  BASIC METABOLIC PANEL WITH GFR   BETA-HYDROXYBUTYRIC ACID  BETA-HYDROXYBUTYRIC ACID  BETA-HYDROXYBUTYRIC ACID  BETA-HYDROXYBUTYRIC ACID  HEMOGLOBIN A1C  I-STAT CHEM 8, ED  I-STAT CG4 LACTIC ACID, ED  TYPE AND SCREEN  ABO/RH  TROPONIN I (HIGH SENSITIVITY)   Results for orders placed or performed during the hospital encounter of 03/17/24  CBG monitoring, ED   Collection Time: 03/17/24 12:26 PM  Result Value Ref Range   Glucose-Capillary 367 (H) 70 - 99 mg/dL  CBC with Differential   Collection Time: 03/17/24 12:39 PM  Result Value Ref Range   WBC 19.4 (H) 4.0 - 10.5 K/uL   RBC 2.98 (L) 3.87 - 5.11 MIL/uL   Hemoglobin 8.3 (L) 12.0 - 15.0 g/dL   HCT 70.2 (L) 63.9 - 53.9 %   MCV 99.7 80.0 - 100.0 fL   MCH 27.9 26.0 - 34.0 pg   MCHC 27.9 (L) 30.0 - 36.0 g/dL   RDW 85.3 88.4 - 84.4 %   Platelets 311 150 - 400 K/uL   nRBC 0.4 (H) 0.0 - 0.2 %   Neutrophils Relative % 51 %   Neutro Abs 10.2 (H) 1.7 - 7.7 K/uL   Lymphocytes Relative 36 %   Lymphs Abs 7.0 (H) 0.7 - 4.0 K/uL   Monocytes Relative 6 %   Monocytes Absolute 1.1 (H) 0.1 - 1.0 K/uL   Eosinophils Relative 1 %   Eosinophils Absolute 0.2 0.0 - 0.5 K/uL   Basophils Relative 1 %   Basophils Absolute 0.1 0.0 - 0.1 K/uL   Immature Granulocytes 5 %   Abs Immature Granulocytes 0.87 (H) 0.00 - 0.07 K/uL  Comprehensive metabolic panel   Collection Time: 03/17/24 12:39 PM  Result Value Ref Range   Sodium 140 135 - 145 mmol/L   Potassium 3.5 3.5 - 5.1 mmol/L   Chloride 104 98 - 111 mmol/L   CO2 11 (L) 22 - 32 mmol/L   Glucose, Bld 470 (H) 70 - 99 mg/dL   BUN 8 6 - 20 mg/dL   Creatinine, Ser 8.74 (H) 0.44 - 1.00 mg/dL   Calcium 8.7 (L) 8.9 - 10.3 mg/dL   Total Protein 5.3 (L) 6.5 - 8.1 g/dL   Albumin 3.0 (L) 3.5 - 5.0 g/dL   AST 24 15 - 41 U/L   ALT 22 0 - 44 U/L   Alkaline Phosphatase 44 38 - 126 U/L   Total Bilirubin 0.7 0.0 - 1.2 mg/dL   GFR, Estimated 59 (L) >60 mL/min   Anion gap 25 (H) 5 - 15  Ethanol  Collection Time: 03/17/24 12:39 PM  Result  Value Ref Range   Alcohol, Ethyl (B) <15 <15 mg/dL  hCG, serum, qualitative   Collection Time: 03/17/24 12:39 PM  Result Value Ref Range   Preg, Serum NEGATIVE NEGATIVE  Blood gas, venous   Collection Time: 03/17/24 12:39 PM  Result Value Ref Range   pH, Ven <6.95 (LL) 7.25 - 7.43   pCO2, Ven 52 44 - 60 mmHg   pO2, Ven <31 (LL) 32 - 45 mmHg   Bicarbonate 9.7 (L) 20.0 - 28.0 mmol/L   Acid-base deficit 23.7 (H) 0.0 - 2.0 mmol/L   O2 Saturation 13.1 %   Patient temperature 37.0   Troponin I (High Sensitivity)   Collection Time: 03/17/24 12:39 PM  Result Value Ref Range   Troponin I (High Sensitivity) 296 (HH) <18 ng/L  I-Stat Lactic Acid   Collection Time: 03/17/24 12:48 PM  Result Value Ref Range   Lactic Acid, Venous >15.0 (HH) 0.5 - 1.9 mmol/L   Comment NOTIFIED PHYSICIAN   Type and screen [facility]   Collection Time: 03/17/24  1:03 PM  Result Value Ref Range   ABO/RH(D) O POS    Antibody Screen NEG    Sample Expiration      03/20/2024,2359 Performed at [facility], [address], [place], [place] [address]   ABO/Rh   Collection Time: 03/17/24  1:10 PM  Result Value Ref Range   ABO/RH(D)      O POS Performed at [facility], [address] [place], [place] [address]     EKG: None  Radiology: No results found.   .Critical Care  Performed by: Odell Balls, PA-C Authorized by: Odell Balls, PA-C   Critical care provider statement:    Critical care time (minutes):  45   Critical care was necessary to treat or prevent imminent or life-threatening deterioration of the following conditions:  Metabolic crisis   Critical care was time spent personally by me on the following activities:  Development of treatment plan with patient or surrogate, discussions with consultants, evaluation of patient's response to treatment, examination of patient, ordering and review of laboratory studies, ordering and review  of radiographic studies, pulse oximetry, re-evaluation of patient's condition and review of old charts    Medications Ordered in the ED  vancomycin (VANCOCIN) IVPB 1000 mg/200 mL premix (1,000 mg Intravenous New Bag/Given 03/17/24 1329)  0.9 %  sodium chloride  infusion (has no administration in time range)  norepinephrine (LEVOPHED) 4mg  in (0.016 mg/mL) premix infusion (has no administration in time range)  succinylcholine (ANECTINE) 200 MG/10ML syringe (has no administration in time range)  etomidate (AMIDATE) 2 MG/ML injection (has no administration in time range)  rocuronium (ZEMURON) 100 MG/10ML injection (has no administration in time range)  sodium bicarbonate  1 mEq/mL injection (has no administration in time range)  sodium bicarbonate  1 mEq/mL injection (has no administration in time range)  docusate sodium  (COLACE) capsule 100 mg (has no administration in time range)  polyethylene glycol (MIRALAX  / GLYCOLAX ) packet 17 g (has no administration in time range)  fentaNYL  (SUBLIMAZE ) injection 100 mcg (has no administration in time range)  insulin regular, human (MYXREDLIN) 100 units/ 100 mL infusion (has no administration in time range)  lactated ringers infusion (has no administration in time range)  dextrose  5 % in lactated ringers infusion (has no administration in time range)  dextrose  50 % solution 0-50 mL (has no administration in time range)  potassium chloride   10 mEq in 100 mL IVPB (has no administration in time range)  LORazepam (ATIVAN) injection 1 mg (1 mg Intravenous Given 03/17/24 1236)  sodium chloride  0.9 % bolus 1,000 mL (1,000 mLs Intravenous New Bag/Given 03/17/24 1241)  lactated ringers bolus 1,000 mL (1,000 mLs Intravenous New Bag/Given 03/17/24 1254)  lactated ringers bolus 1,000 mL (1,000 mLs Intravenous New Bag/Given 03/17/24 1326)  piperacillin-tazobactam (ZOSYN) IVPB 3.375 g (3.375 g Intravenous New Bag/Given 03/17/24 1330)  sodium bicarbonate  injection (100 mEq  Intravenous Given 03/17/24 1324)  etomidate (AMIDATE) injection (10 mg Intravenous Given 03/17/24 1325)  succinylcholine (ANECTINE) injection (100 mg Intravenous Given 03/17/24 1325)  iohexol  (OMNIPAQUE ) 300 MG/ML solution 100 mL (100 mLs Intravenous Contrast Given 03/17/24 1358)    Clinical Course as of 03/17/24 1410  Wed Mar 17, 2024  1255 ON initial evaluation, patient quite agitated, combative, altered. Soft restraints for patient and staff safety and to facilitate care. O2 saturation 88-93%. 2L by [place] started. IV bolus running. 2nd bolus ordered (LR). Lactic acid >15. [SU]  1305 I-Stat Lactic Acid [DR]  1328 Patient intubated (Dr. Levander). Critical Care in the room - assumes care.  [SU]    Clinical Course User Index [DR] Levander Houston, MD [SU] Odell Balls, PA-C                                 Medical Decision Making This patient presents to the ED for concern of AMS, this involves an extensive number of treatment options, and is a complaint that carries with it a high risk of complications and morbidity.  The differential diagnosis includes encephalopathy, sepsis, DKA, SDU/SAH,    Co morbidities that complicate the patient evaluation  H/o Hashimoto's not currently untreated   Additional history obtained:  Additional history and/or information obtained from chart review, notable for Recent [facility] E-visits only: 6/24 - diarrhea 5/13 - dental pain 4/10 - SOB/viral URI 3/29 - heartburn 3/19 - Gastroenteritis 2/26 - gastroenteritis 2/26 - anxiety  **patient's husband states these visits are to obtain a note for work. No recent illness.   Lab Tests:  I Ordered, and personally interpreted labs.  The pertinent results include:   Lactic acid >15 pH: <6.95, pCO2 52, bicarb 9.7 Preg negative CBC: WBC 19.7, hgb 8.3, plts 311 Chemistries pending   Imaging Studies ordered:  I ordered imaging studies including CT head, chest, abd/pel I independently visualized and  interpreted imaging which showed pending at time of critical care admission    Cardiac Monitoring:  The patient was maintained on a cardiac monitor.  I personally viewed and interpreted the cardiac monitored which showed an underlying rhythm of: Tachycardic, appears sinus on monitor   Medicines ordered and prescription drug management:  Ativan 1 mg for agitation on arrival Broad spectrum antibiotics, Vanc and Zosyn ordered, pending critical care interventions Amp bicarb per PCCM prior to intubation  Test Considered:  N/a   Critical Interventions:  Pt with Lactic acid >15, pH <6.9,  Blood pressure labile - Levophed ordered, however, currently 118 manual. Levophed on hold Moved to be intubated (Dr. Levander) - discussed with critical care who advises bicarb before intubation Pan CT pending   Consultations Obtained:  I requested consultation with the Dr. Theophilus, critical care,  and discussed lab and imaging findings as well as pertinent plan - they recommend: they will assume care of the patient   Problem List / ED Course:  Here with  initial complaint, per husband of pain in the low abdomen extending to chest. Emotionally upset during that phone call but coherent Arrives in ED extremely agitated, minimally directable, recognizes/responds to husband Requires soft restraints to facilitate care, Ativan 1 mg ordered O2 saturation upper 80's to low 90's, tachypneic. O2 support placed, 2L  Dr. Levander involved in care Patient felt to be declining. Decision made to intubate.  Critical care on board and assumes care after intubation Patient transported to CT.   Dr. Kip, radiology, advises CT abd/pel showing large volume blood in the abdomen from unknown source. Dr. Theophilus made aware.   Reevaluation:  Intubated   Social Determinants of Health:  Former smoker, lives with husband of 4 years   Disposition:  After consideration of the diagnostic results and the patients response  to treatment, I feel that the patient would benefit from admit.   Amount and/or Complexity of Data Reviewed Labs: ordered. Decision-making details documented in ED Course. Radiology: ordered.  Risk Prescription drug management. Decision regarding hospitalization.        Final diagnoses:  Hyperglycemia  Delirium  Acidosis    ED Discharge Orders     None          Odell Balls, PA-C 03/17/24 1410    Odell Balls, PA-C 03/17/24 1441    Levander Houston, MD 03/17/24 [phone]

## 2024-03-17 NOTE — Transfer of Care (Signed)
 Immediate Anesthesia Transfer of Care Note  Patient: [name]  Procedure(s) Performed: LAPAROTOMY, EXPLORATORY, myomectomy  Patient Location: PACU and ICU  Anesthesia Type:General  Level of Consciousness: Patient remains intubated per anesthesia plan  Airway & Oxygen Therapy: Patient remains intubated per anesthesia plan and Patient placed on Ventilator (see vital sign flow sheet for setting)  Post-op Assessment: Report given to RN and Post -op Vital signs reviewed and stable  Post vital signs: Reviewed and stable  Last Vitals:  Vitals Value Taken Time  BP    Temp    Pulse 118 03/17/24 17:30  Resp 30 03/17/24 17:32  SpO2 100 % 03/17/24 17:30  Vitals shown include unfiled device data.  Last Pain: There were no vitals filed for this visit.       Complications: No notable events documented.

## 2024-03-17 NOTE — Progress Notes (Signed)
 Pt arrived to the ICU post-op intubated and off pressors.  Received 4 units PRBC's, Ca and two amps bicarb in the ED.  Repeat labs pending, last pH 7.1 in the OR, repeat ABG and will likely leave intubated overnight.  [name] Sumiko Ceasar, PA-C

## 2024-03-17 NOTE — Progress Notes (Signed)
 eLink Physician-Brief Progress Note Patient Name: [name] DOB: [birth year] MRN: [id number]   Date of Service  03/17/2024  HPI/Events of Note  Bedside requesting clarification on some orders.  Patient is currently running potassium replacements, recheck was at 3.8.  Magnesium checked 1.5 without replacements.  Patient was also ordered DKA protocol insulin, but this was never initiated.  Beta-hydroxybutyrate x 2 is negative.  Serum bicarb is improved to 17 with improvement in lactic acidosis.  No insulin on home profile.  eICU Interventions  Maintain remaining KCl as ordered Add magnesium supplementation Discontinue DKA protocol, initiate sliding scale insulin. May need to initiate basal insulin, but currently borderline sugars     Intervention Category Minor Interventions: Routine modifications to care plan (e.g. PRN medications for pain, fever)  Jaaziah Schulke 03/17/2024, 9:02 PM

## 2024-03-17 NOTE — Consult Note (Addendum)
 Naba L Harshbarger Oct 12, 1990  [id number].    Requesting MD: Theophilus, MD Chief Complaint/Reason for Consult: hemoperitoneum  HPI:  [name] is a 33 year old female with a past medical history of uterine fibroids.  During my evaluation the patient is intubated and critically ill so history is gathered from the husband.  Husband states at 10:35 AM the patient texted him complaining of abdominal pain after changing her tampon, followed by feeling sweaty and worsening abdominal pain.  She was agitated in the emergency department requiring intubation.  She had hypotension and transiently responded to IV fluids.  CT scan of the abdomen shows large volume hemoperitoneum.  Labs significant for lactic acidosis with lactate greater than 15, WBC 19.  Surgery was consulted emergently due to hemoperitoneum.  Husband states the patient takes no daily medications including blood thinners.  No known drug allergies.  He does not think she has ever had abdominal surgery.  Reports occasional alcohol use and daily marijuana use.  Denies cigarette use.  According to bedside nurse patient was intermittently talking, moving all extremities, prior to intubation. ROS: Review of Systems  All other systems reviewed and are negative.   Family History  Problem Relation Age of Onset   Diabetes Paternal Grandfather    Obesity Mother    Hypertension Father    Anemia Father    Obesity Paternal Aunt    Cancer Maternal Grandfather    Diabetes Maternal Grandfather    Thyroid  disease Neg Hx    Kidney disease Neg Hx    Bipolar disorder Neg Hx     Past Medical History:  Diagnosis Date   Amenorrhea, secondary    Bipolar disorder (HCC)    Galactorrhea    Hirsutism    Hyperprolactinemia (HCC)    Hypothyroidism    Hypothyroidism, acquired, autoimmune    Pituitary tumor    Thyroiditis, autoimmune     Past Surgical History:  Procedure Laterality Date   None      Social History:  reports that [name] is a  non-smoker but has been exposed to tobacco smoke. [name] has never used smokeless tobacco. [name] reports current alcohol use. Charlie reports current drug use. Drug: Marijuana.  Allergies: No Known Allergies  (Not in a hospital admission)    Physical Exam: Blood pressure (!) 78/46, pulse (!) 115, temperature (!) 95 F (35 C), resp. rate (!) 30, height 5' 2 (1.575 m), weight 70.3 kg, SpO2 100%. General: Black female, pale, critically ill, intubated HEENT: head -normocephalic, atraumatic; Eyes: Anicteric sclerae Neck- Trachea is midline, no thyromegaly or JVD appreciated.  CV-sinus tachycardia, heart rate 110 bpm, no lower extremity edema Pulm-ventilated respirations, CTAB Abd-abdomen is rigid, peritonitis is present GU- deferred  MSK- UE/LE symmetrical, no cyanosis, clubbing, or edema. Neuro- CN II-XII grossly in tact, no paresthesias. Psych- Alert and Oriented x3 with appropriate affect Skin: warm and dry, no rashes or lesions   Results for orders placed or performed during the hospital encounter of 03/17/24 (from the past 48 hours)  CBG monitoring, ED     Status: Abnormal   Collection Time: 03/17/24 12:26 PM  Result Value Ref Range   Glucose-Capillary 367 (H) 70 - 99 mg/dL    Comment: Glucose reference range applies only to samples taken after fasting for at least 8 hours.  CBC with Differential     Status: Abnormal   Collection Time: 03/17/24 12:39 PM  Result Value Ref Range   WBC 19.4 (H) 4.0 - 10.5 K/uL  RBC 2.98 (L) 3.87 - 5.11 MIL/uL   Hemoglobin 8.3 (L) 12.0 - 15.0 g/dL   HCT 70.2 (L) 63.9 - 53.9 %   MCV 99.7 80.0 - 100.0 fL   MCH 27.9 26.0 - 34.0 pg   MCHC 27.9 (L) 30.0 - 36.0 g/dL   RDW 85.3 88.4 - 84.4 %   Platelets 311 150 - 400 K/uL   nRBC 0.4 (H) 0.0 - 0.2 %   Neutrophils Relative % 51 %   Neutro Abs 10.2 (H) 1.7 - 7.7 K/uL   Lymphocytes Relative 36 %   Lymphs Abs 7.0 (H) 0.7 - 4.0 K/uL   Monocytes Relative 6 %   Monocytes Absolute 1.1 (H) 0.1 - 1.0  K/uL   Eosinophils Relative 1 %   Eosinophils Absolute 0.2 0.0 - 0.5 K/uL   Basophils Relative 1 %   Basophils Absolute 0.1 0.0 - 0.1 K/uL   Immature Granulocytes 5 %   Abs Immature Granulocytes 0.87 (H) 0.00 - 0.07 K/uL    Comment: Performed at [facility], [address], [place], [place] [address]  Comprehensive metabolic panel     Status: Abnormal   Collection Time: 03/17/24 12:39 PM  Result Value Ref Range   Sodium 140 135 - 145 mmol/L    Comment: ELECTROLYTES REPEATED TO VERIFY   Potassium 3.5 3.5 - 5.1 mmol/L   Chloride 104 98 - 111 mmol/L    Comment: ELECTROLYTES REPEATED TO VERIFY   CO2 11 (L) 22 - 32 mmol/L    Comment: ELECTROLYTES REPEATED TO VERIFY   Glucose, Bld 470 (H) 70 - 99 mg/dL    Comment: Glucose reference range applies only to samples taken after fasting for at least 8 hours.   BUN 8 6 - 20 mg/dL   Creatinine, Ser 8.74 (H) 0.44 - 1.00 mg/dL   Calcium 8.7 (L) 8.9 - 10.3 mg/dL   Total Protein 5.3 (L) 6.5 - 8.1 g/dL   Albumin 3.0 (L) 3.5 - 5.0 g/dL   AST 24 15 - 41 U/L   ALT 22 0 - 44 U/L   Alkaline Phosphatase 44 38 - 126 U/L   Total Bilirubin 0.7 0.0 - 1.2 mg/dL   GFR, Estimated 59 (L) >60 mL/min    Comment: (NOTE) Calculated using the CKD-EPI Creatinine Equation (2021)    Anion gap 25 (H) 5 - 15    Comment: Performed at [facility], [address] [place], [place] [address]  Ethanol     Status: None   Collection Time: 03/17/24 12:39 PM  Result Value Ref Range   Alcohol, Ethyl (B) <15 <15 mg/dL    Comment: (NOTE) For medical purposes only. Performed at [name], [address] [place], [place] [address]   hCG, serum, qualitative     Status: None   Collection Time: 03/17/24 12:39 PM  Result Value Ref Range   Preg, Serum NEGATIVE NEGATIVE    Comment:        THE SENSITIVITY OF THIS METHODOLOGY IS >10 mIU/mL. Performed at [facility], [address]  [place], [place] [address]   TSH     Status: Abnormal   Collection Time: 03/17/24 12:39 PM  Result Value Ref Range   TSH 9.037 (H) 0.350 - 4.500 uIU/mL    Comment: Performed by a 3rd Generation assay with a functional sensitivity of <=0.01 uIU/mL. Performed at [facility], [address], [place], [place] [address]   Troponin I (  High Sensitivity)     Status: Abnormal   Collection Time: 03/17/24 12:39 PM  Result Value Ref Range   Troponin I (High Sensitivity) 296 (HH) <18 ng/L    Comment: CRITICAL RESULT CALLED TO, READ BACK BY AND VERIFIED WITH BURGESS, A. RN AT 03/17/2024 1359 BY JEREMY C (NOTE) Elevated high sensitivity troponin I (hsTnI) values and significant  changes across serial measurements may suggest ACS but many other  chronic and acute conditions are known to elevate hsTnI results.  Refer to the Links section for chest pain algorithms and additional  guidance. Performed at [facility], [address] [place], [place] [address]   Blood gas, venous     Status: Abnormal   Collection Time: 03/17/24 12:39 PM  Result Value Ref Range   pH, Ven <6.95 (LL) 7.25 - 7.43    Comment: CRITICAL RESULT CALLED TO, READ BACK BY AND VERIFIED WITH: BURGESS, A. RN AT 03/17/2024 1310 BY JEREMY C    pCO2, Ven 52 44 - 60 mmHg   pO2, Ven <31 (LL) 32 - 45 mmHg    Comment: CRITICAL RESULT CALLED TO, READ BACK BY AND VERIFIED WITH: BURGESS, A. RN AT 03/17/2024 1310 BY JEREMY C    Bicarbonate 9.7 (L) 20.0 - 28.0 mmol/L   Acid-base deficit 23.7 (H) 0.0 - 2.0 mmol/L   O2 Saturation 13.1 %   Patient temperature 37.0     Comment: Performed at [facility], [address] [place], [place] [address]  I-Stat Lactic Acid     Status: Abnormal   Collection Time: 03/17/24 12:48 PM  Result Value Ref Range   Lactic Acid, Venous >15.0 (HH) 0.5 - 1.9 mmol/L   Comment NOTIFIED PHYSICIAN   Type and screen [facility]     Status:  None (Preliminary result)   Collection Time: 03/17/24  1:03 PM  Result Value Ref Range   ABO/RH(D) O POS    Antibody Screen NEG    Sample Expiration 03/20/2024,2359    Unit Number [id number]    Blood Component Type RED CELLS,LR    Unit division 00    Status of Unit ISSUED    Transfusion Status OK TO TRANSFUSE    Crossmatch Result Compatible    Unit Number [id number]    Blood Component Type RED CELLS,LR    Unit division 00    Status of Unit ISSUED    Transfusion Status OK TO TRANSFUSE    Crossmatch Result      Compatible Performed at [facility], [address], [place], [place] [address]   ABO/Rh     Status: None   Collection Time: 03/17/24  1:10 PM  Result Value Ref Range   ABO/RH(D)      O POS Performed at [facility], [address] [place], [place] [address]   Blood gas, arterial     Status: Abnormal   Collection Time: 03/17/24  2:25 PM  Result Value Ref Range   FIO2 100 %   Delivery systems VENTILATOR    Mode PRESSURE REGULATED VOLUME CONTROL    MECHVT 400 mL   RATE 30 resp/min   PEEP 5 cm H20   pH, Arterial 7.16 (LL) 7.35 - 7.45    Comment: CRITICAL RESULT CALLED TO, READ BACK BY AND VERIFIED WITH: MOORE,A. RN AT 1510 03/17/24 MULLINS,T    pCO2 arterial 34 32 - 48 mmHg   pO2, Arterial 407 (H) 83 - 108 mmHg  Bicarbonate 12.1 (L) 20.0 - 28.0 mmol/L   Acid-base deficit 15.6 (H) 0.0 - 2.0 mmol/L   O2 Saturation 99.6 %   Patient temperature 37.0    Collection site RIGHT RADIAL    Drawn by [id number]    Allens test (pass/fail) PASS PASS    Comment: Performed at [facility], [address], [place], [place] [address]  I-Stat Lactic Acid     Status: Abnormal   Collection Time: 03/17/24  3:24 PM  Result Value Ref Range   Lactic Acid, Venous 14.8 (HH) 0.5 - 1.9 mmol/L   Comment NOTIFIED PHYSICIAN   Prepare RBC (crossmatch) INTRAOP ONLY     Status: None   Collection Time: 03/17/24  3:31 PM  Result  Value Ref Range   Order Confirmation      ORDER PROCESSED BY BLOOD BANK Performed at [facility], [address], [place], [place] [address]    CT CHEST ABDOMEN PELVIS W CONTRAST Result Date: 03/17/2024 CLINICAL DATA:  Sepsis * Tracking Code: BO * EXAM: CT CHEST, ABDOMEN, AND PELVIS WITH CONTRAST TECHNIQUE: Multidetector CT imaging of the chest, abdomen and pelvis was performed following the standard protocol during bolus administration of intravenous contrast. RADIATION DOSE REDUCTION: This exam was performed according to the departmental dose-optimization program which includes automated exposure control, adjustment of the mA and/or kV according to patient size and/or use of iterative reconstruction technique. CONTRAST:  OMNIPAQUE  IOHEXOL  300 MG/ML  SOLN COMPARISON:  08/18/2020 FINDINGS: CT CHEST FINDINGS Cardiovascular: No significant vascular findings. Normal heart size. No pericardial effusion. Mediastinum/Nodes: No enlarged mediastinal, hilar, or axillary lymph nodes. Thymic remnant in the anterior mediastinum. Esophagus is distended and fluid-filled with esophagogastric tube, tip and side port below the diaphragm. Lungs/Pleura: Endotracheal intubation, tube tip above the carina. Irregular bibasilar atelectasis or consolidation. No pleural effusion or pneumothorax. Musculoskeletal: No chest wall abnormality. No acute osseous findings. CT ABDOMEN PELVIS FINDINGS Hepatobiliary: No solid liver abnormality is seen. No gallstones, gallbladder wall thickening, or biliary dilatation. Pancreas: Unremarkable. No pancreatic ductal dilatation or surrounding inflammatory changes. Spleen: Normal in size without significant abnormality. Adrenals/Urinary Tract: Adrenal glands are unremarkable. Kidneys are normal, without renal calculi, solid lesion, or hydronephrosis. Bladder is unremarkable. Stomach/Bowel: Stomach is within normal limits. Appendix appears normal. No evidence of bowel wall  thickening, distention, or inflammatory changes. Vascular/Lymphatic: Flattened IVC, concerning for hypovolemia. No enlarged abdominal or pelvic lymph nodes. Reproductive: Extremely bulky fibroid uterus, significantly increased in size compared to prior examination, overall dimensions at least 21.5 x 11.5 x 13.8 cm (series 11, image 95, series 3, image 84). Other: No abdominal wall hernia or abnormality. Large volume intermediate attenuation fluid and heterogeneous, layering blood product throughout the abdomen and pelvis. Peritoneal and omental thickening throughout the ventral abdomen (series 3, image 69). Musculoskeletal: No acute osseous findings. IMPRESSION: 1. Large volume intermediate attenuation fluid and heterogeneous, layering blood product throughout the abdomen and pelvis, consistent with hemoperitoneum although of uncertain source. Trauma is generally by far the most common etiology of abdominal hemorrhage particularly in this demographic however reportedly there is no history of trauma. Spontaneous hemorrhage from uterine fibroids is a rare, although reported complication. 2. Extremely bulky fibroid uterus, significantly increased in size compared to prior examination, overall dimensions at least 21.5 x 11.5 x 13.8 cm and particularly marked enlargement of a fibroid of the uterine fundus. This may reflect a benign fibroid although leiomyosarcoma is not excluded especially given other constellation of findings. 3. Peritoneal and omental  thickening throughout the ventral abdomen, of uncertain significance although suspicious for peritoneal malignancy. 4. Irregular bibasilar atelectasis or consolidation, most suggestive of aspiration and/or chronic sequelae thereof given the presence of fluid within the esophagus. 5. Endotracheal and esophagogastric intubation. Findings reported by telephone to Tylene Paris, [place], 2:37 p.m. 03/17/2024 Electronically Signed   By: Marolyn JONETTA Jaksch M.D.   On: 03/17/2024 14:42    CT Head Wo Contrast Result Date: 03/17/2024 CLINICAL DATA:  Provided history: Headache, neuro deficit. Mental status change, unknown cause. EXAM: CT HEAD WITHOUT CONTRAST TECHNIQUE: Contiguous axial images were obtained from the base of the skull through the vertex without intravenous contrast. RADIATION DOSE REDUCTION: This exam was performed according to the departmental dose-optimization program which includes automated exposure control, adjustment of the mA and/or kV according to patient size and/or use of iterative reconstruction technique. COMPARISON:  Brain MRI 12/27/2010. FINDINGS: Moderately motion degraded examination. Within this limitation, findings are as follows. Brain: Cerebral volume is normal. The small pituitary lesion demonstrated on the prior brain MRI of 12/27/2010 cannot be reassessed on this non-contrast head CT. No evidence of an intracranial mass elsewhere. There is no acute intracranial hemorrhage. No demarcated cortical infarct. No extra-axial fluid collection. No midline shift. Vascular: No hyperdense vessel. Skull: No calvarial fracture or aggressive osseous lesion. Sinuses/Orbits: No mass or acute finding within the imaged orbits. No significant paranasal sinus disease at the imaged levels. IMPRESSION: 1. Motion degraded examination. 2. Within this limitation, no acute intracranial abnormality is identified. 3. Please note, the small pituitary lesion demonstrated on the prior brain MRI of 12/27/2010 cannot be reassessed on this non-contrast head CT. This would be best reassessed with a pituitary protocol brain MRI (with and without contrast). Electronically Signed   By: Rockey Childs D.O.   On: 03/17/2024 14:35   DG Chest Portable 1 View Result Date: 03/17/2024 CLINICAL DATA:  POST INTUBATION EXAM: PORTABLE CHEST - 1 VIEW COMPARISON:  August 20, 2020 FINDINGS: Well-positioned endotracheal tube terminating in the mid trachea. Esophagogastric tube is well positioned terminating in  the stomach. Lower lung volumes. No focal airspace consolidation, pleural effusion, or pneumothorax. No cardiomegaly. IMPRESSION: Well-positioned endotracheal and esophagogastric tubes. No pneumothorax. Electronically Signed   By: Rogelia Myers M.D.   On: 03/17/2024 14:32    Assessment/Plan Large volume hemoperitoneum, unclear source - CT scan of the abdomen and pelvis shows hemoperitoneum and large uterine fibroids.  Suspect uterus is the source of hemoperitoneum.  Also peritoneal and omental thickening suspicious for possible peritoneal malignancy.  Hemorrhagic vs septic shock, secondary to above.  2 units PRBC ordered  Recommend emergent exploratory laparotomy  The operative and non-operative management of hemoperitoneum was discussed with the patient's husband. Risks of surgery including bleeding, infection, damage to surrounding structures, drain placement, need for additional procedures, prolonged hospital stay, the risks of general anesthesia, risk of death were discussed with the patient and he would like to proceed with surgery. Questions were welcomed and answered.   Patient's husband states that they do want children but if hysterectomy is necessary to save the patient's life, he agrees to all indicated life-saving procedures.   Appreciate critical care's assistance in management of this ill patient.   I reviewed nursing notes, ED provider notes, hospitalist notes, last 24 h vitals and pain scores, last 48 h intake and output, last 24 h labs and trends, and last 24 h imaging results.  Almarie GORMAN Pringle, [facility] Surgery 03/17/2024, 3:46 PM Please see Amion for pager number  during day hours 7:00am-4:30pm or 7:00am -11:30am on weekends

## 2024-03-17 NOTE — ED Notes (Signed)
Pt  Transferred to OR

## 2024-03-17 NOTE — Op Note (Signed)
 03/17/2024  5:11 PM  PATIENT:  [name]  33 y.o. adult  Patient Care Team: Patient, No Pcp Per as PCP - General (General Practice)  PRE-OPERATIVE DIAGNOSIS:  Intraperitoneal Bleeding  POST-OPERATIVE DIAGNOSIS:  Bleeding fibroid tumor  PROCEDURE:  EXPLORATORY LAPAROTOMY, myomectomy   Surgeon(s): Debby Hila, MD  ASSISTANT: none   ANESTHESIA:   general  EBL:  Total I/O In: 2770 [I.V.:1500; Blood:1260; Other:10] Out: 2400 [Urine:400; Blood:2000]  DRAINS: none   SPECIMEN:  Source of Specimen:  Uterine Fibroid  DISPOSITION OF SPECIMEN:  PATHOLOGY  COUNTS:  YES  PLAN OF CARE: Admit to inpatient   PATIENT DISPOSITION:  PACU - hemodynamically stable.  INDICATION: 33 y.o. F who presented to the emergency department with hypotension tachycardia and abdominal pain.  CT scan showed a large volume hemoperitoneum.  She was intubated and sedated in the ICU and appropriate IV access was obtained.  She was then taken emergently to the operating room for exploratory laparotomy.   OR FINDINGS: Bleeding fibroid tumor arising from the right lateral uterine wall.  DESCRIPTION: the patient was identified in the preoperative holding area and taken to the OR where they were laid supine on the operating room table.  General anesthesia was induced without difficulty. SCDs were also noted to be in place prior to the initiation of anesthesia.  The patient was then prepped and draped in the usual sterile fashion.   A surgical timeout was performed indicating the correct patient, procedure, positioning and need for preoperative antibiotics.   I made a midline incision using a 10 blade scalpel.  Dissection was carried down through subcutaneous tissues to the level of the fascia.  The fascia was incised at midline.  Upon entering the abdomen approximately 2 L of dark blood was evacuated.  The abdomen was packed in all 4 quadrants using laparotomy sponges.  We then held pressure while the  anesthesia team was able to get an packed red blood cells and resuscitate.  Once the patient's blood pressure was stable, the abdomen was unpacked 1 quadrant at the time.  I began in the left upper quadrant.  The packs were removed and the spleen was evaluated.  There was no sign of injury noted or active bleeding.  I then unpacked the right upper quadrant and noted a normal-appearing liver and gallbladder.  There was no sign of active bleeding.  I then unpacked the left lower quadrant and also did not see any active bleeding.  I palpated a large fibroid tumor on the right side of the uterus.  This was brought out of the wound and inspected.  There was a small defect in the fibroid that was oozing blood.  This was only able to be controlled with direct pressure.  I inspected the remaining right lower quadrant and base of the mesentery and found no active bleeding.  The entire abdomen was irrigated with the fibroid out of the abdomen and no other blood was noted within the abdomen.  Multiple clots were pulled out of the pelvis and along the paracolic gutters.  At this point I consulted with my gynecology colleagues.  They recommended excision of the fibroid if possible.  I used the LigaSure device over Kocher clamps to divide the fibroid tumor from its attachments to the uterus.  I then separated the tumor and sent this to pathology.  The transected edge was oversewn using multiple 0 Vicryl sutures.  I then oversewed this with a running 2 oh V-Loc suture.  There was still some bleeding at the middle of the wound and this was controlled using figure-of-eight 0 Vicryl sutures.  Once this was complete, hemostasis was good.  We placed the uterus back into the pelvis and allowed it to sit in its normal position.  There was no bleeding when we returned to evaluate this.  I did tack a piece of omentum to the edge of the uterus to prevent adhesions to the small bowel and help with hemostasis.  The fascia was then closed  using 2 #0 looped PDS sutures.  The skin was closed using staples and a sterile dressing was applied.  The patient was then left intubated and sent to the ICU in critical but stable condition.  All counts were correct per operating room staff.  Michele JAYSON Ned, MD  Colorectal and General Surgery [facility] Surgery

## 2024-03-17 NOTE — Anesthesia Preprocedure Evaluation (Addendum)
 Anesthesia Evaluation  Patient identified by MRN, date of birth, ID band Patient unresponsive    Reviewed: Allergy & Precautions, H&P , NPO status , Patient's Chart, lab work & pertinent test resultsPreop documentation limited or incomplete due to emergent nature of procedure.  Airway Mallampati: Unable to assess   Neck ROM: Full    Dental  (+) Dental Advisory Given   Pulmonary neg pulmonary ROS   Pulmonary exam normal breath sounds clear to auscultation       Cardiovascular negative cardio ROS  Rhythm:Regular Rate:Tachycardia     Neuro/Psych  PSYCHIATRIC DISORDERS   Bipolar Disorder   negative neurological ROS     GI/Hepatic negative GI ROS, Neg liver ROS,,,  Endo/Other  diabetes, Poorly Controlled, Type 1, Insulin DependentHypothyroidism  Presented in DKA , glucose 470  Renal/GU Renal diseaseCr 1.25  negative genitourinary   Musculoskeletal negative musculoskeletal ROS (+)    Abdominal   Peds negative pediatric ROS (+)  Hematology  (+) Blood dyscrasia, anemia Hb 8.3, plt 311   Anesthesia Other Findings   Reproductive/Obstetrics negative OB ROS                              Anesthesia Physical Anesthesia Plan  ASA: 4 and emergent  Anesthesia Plan: General   Post-op Pain Management: Dilaudid IV, Ketamine IV* and Ofirmev  IV (intra-op)*   Induction: Intravenous  PONV Risk Score and Plan: Midazolam, Ondansetron  and Treatment may vary due to age or medical condition  Airway Management Planned: Oral ETT  Additional Equipment: Arterial line  Intra-op Plan:   Post-operative Plan: Extubation in OR  Informed Consent: I have reviewed the patients History and Physical, chart, labs and discussed the procedure including the risks, benefits and alternatives for the proposed anesthesia with the patient or authorized representative who has indicated his/her understanding and acceptance.      Dental advisory given  Plan Discussed with: CRNA  Anesthesia Plan Comments: (CT w/ large hemorrhage, unknown source Intubated in ED For airway protection/mental status Will need central line, arterial line Cross for 2 units)        Anesthesia Quick Evaluation

## 2024-03-18 ENCOUNTER — Encounter (HOSPITAL_COMMUNITY): Payer: Self-pay | Admitting: General Surgery

## 2024-03-18 DIAGNOSIS — R571 Hypovolemic shock: Secondary | ICD-10-CM

## 2024-03-18 DIAGNOSIS — R578 Other shock: Secondary | ICD-10-CM | POA: Diagnosis present

## 2024-03-18 LAB — BPAM RBC
Blood Product Expiration Date: 202508052359
Blood Product Expiration Date: 202508052359
Blood Product Expiration Date: 202508062359
Blood Product Expiration Date: 202508062359
ISSUE DATE / TIME: 202507021539
ISSUE DATE / TIME: 202507021539
ISSUE DATE / TIME: 202507021616
ISSUE DATE / TIME: 202507021616
Unit Type and Rh: 5100
Unit Type and Rh: 5100
Unit Type and Rh: 5100
Unit Type and Rh: 5100

## 2024-03-18 LAB — CBC
HCT: 34.1 % — ABNORMAL LOW (ref 36.0–46.0)
HCT: 31.4 % — ABNORMAL LOW (ref 36.0–46.0)
Hemoglobin: 11.7 g/dL — ABNORMAL LOW (ref 12.0–15.0)
Hemoglobin: 10.6 g/dL — ABNORMAL LOW (ref 12.0–15.0)
MCH: 29.4 pg (ref 26.0–34.0)
MCH: 29.3 pg (ref 26.0–34.0)
MCHC: 34.3 g/dL (ref 30.0–36.0)
MCHC: 33.8 g/dL (ref 30.0–36.0)
MCV: 85.7 fL (ref 80.0–100.0)
MCV: 86.7 fL (ref 80.0–100.0)
Platelets: 87 10*3/uL — ABNORMAL LOW (ref 150–400)
Platelets: 78 10*3/uL — ABNORMAL LOW (ref 150–400)
RBC: 3.98 MIL/uL (ref 3.87–5.11)
RBC: 3.62 MIL/uL — ABNORMAL LOW (ref 3.87–5.11)
RDW: 17.6 % — ABNORMAL HIGH (ref 11.5–15.5)
RDW: 17.3 % — ABNORMAL HIGH (ref 11.5–15.5)
WBC: 16.6 10*3/uL — ABNORMAL HIGH (ref 4.0–10.5)
WBC: 16.8 10*3/uL — ABNORMAL HIGH (ref 4.0–10.5)
nRBC: 0.2 % (ref 0.0–0.2)
nRBC: 0.1 % (ref 0.0–0.2)

## 2024-03-18 LAB — POCT I-STAT 7, (LYTES, BLD GAS, ICA,H+H)
Acid-base deficit: 16 mmol/L — ABNORMAL HIGH (ref 0.0–2.0)
Acid-base deficit: 13 mmol/L — ABNORMAL HIGH (ref 0.0–2.0)
Bicarbonate: 12.3 mmol/L — ABNORMAL LOW (ref 20.0–28.0)
Bicarbonate: 14.8 mmol/L — ABNORMAL LOW (ref 20.0–28.0)
Calcium, Ion: 0.88 mmol/L — CL (ref 1.15–1.40)
Calcium, Ion: 1.14 mmol/L — ABNORMAL LOW (ref 1.15–1.40)
HCT: 18 % — ABNORMAL LOW (ref 36.0–46.0)
HCT: 32 % — ABNORMAL LOW (ref 36.0–46.0)
Hemoglobin: 6.1 g/dL — CL (ref 12.0–15.0)
Hemoglobin: 10.9 g/dL — ABNORMAL LOW (ref 12.0–15.0)
O2 Saturation: 100 %
O2 Saturation: 100 %
Potassium: 4.1 mmol/L (ref 3.5–5.1)
Potassium: 3.6 mmol/L (ref 3.5–5.1)
Sodium: 140 mmol/L (ref 135–145)
Sodium: 143 mmol/L (ref 135–145)
TCO2: 13 mmol/L — ABNORMAL LOW (ref 22–32)
TCO2: 16 mmol/L — ABNORMAL LOW (ref 22–32)
pCO2 arterial: 38.7 mmHg (ref 32–48)
pCO2 arterial: 41.5 mmHg (ref 32–48)
pH, Arterial: 7.11 — CL (ref 7.35–7.45)
pH, Arterial: 7.16 — CL (ref 7.35–7.45)
pO2, Arterial: 378 mmHg — ABNORMAL HIGH (ref 83–108)
pO2, Arterial: 294 mmHg — ABNORMAL HIGH (ref 83–108)

## 2024-03-18 LAB — BASIC METABOLIC PANEL WITH GFR
Anion gap: 7 (ref 5–15)
BUN: 9 mg/dL (ref 6–20)
CO2: 19 mmol/L — ABNORMAL LOW (ref 22–32)
Calcium: 7.5 mg/dL — ABNORMAL LOW (ref 8.9–10.3)
Chloride: 113 mmol/L — ABNORMAL HIGH (ref 98–111)
Creatinine, Ser: 0.92 mg/dL (ref 0.44–1.00)
GFR, Estimated: >60 mL/min (ref >60)
Glucose, Bld: 104 mg/dL — ABNORMAL HIGH (ref 70–99)
Potassium: 3.8 mmol/L (ref 3.5–5.1)
Sodium: 139 mmol/L (ref 135–145)

## 2024-03-18 LAB — GLUCOSE, CAPILLARY
Glucose-Capillary: 106 mg/dL — ABNORMAL HIGH (ref 70–99)
Glucose-Capillary: 123 mg/dL — ABNORMAL HIGH (ref 70–99)
Glucose-Capillary: 89 mg/dL (ref 70–99)
Glucose-Capillary: 90 mg/dL (ref 70–99)
Glucose-Capillary: 93 mg/dL (ref 70–99)
Glucose-Capillary: 95 mg/dL (ref 70–99)
Glucose-Capillary: 97 mg/dL (ref 70–99)

## 2024-03-18 LAB — TYPE AND SCREEN
ABO/RH(D): O POS
Antibody Screen: NEGATIVE
Unit division: 0
Unit division: 0
Unit division: 0
Unit division: 0

## 2024-03-18 LAB — URINE CULTURE: Culture: NO GROWTH

## 2024-03-18 LAB — PHOSPHORUS: Phosphorus: 3 mg/dL (ref 2.5–4.6)

## 2024-03-18 LAB — TRIGLYCERIDES: Triglycerides: 275 mg/dL — ABNORMAL HIGH (ref <150)

## 2024-03-18 LAB — LACTIC ACID, PLASMA: Lactic Acid, Venous: 1.1 mmol/L (ref 0.5–1.9)

## 2024-03-18 LAB — MAGNESIUM: Magnesium: 2.5 mg/dL — ABNORMAL HIGH (ref 1.7–2.4)

## 2024-03-18 MED ORDER — OXYCODONE HCL 5 MG PO TABS
5.0000 mg | ORAL_TABLET | ORAL | Status: DC | PRN
  Administered 2024-03-18 – 2024-03-19 (×2): 5 mg via ORAL
  Filled 2024-03-18 (×2): qty 1

## 2024-03-18 MED ORDER — DIPHENHYDRAMINE HCL 50 MG/ML IJ SOLN: 12.5000 mg | Freq: Four times a day (QID) | INTRAMUSCULAR | Status: DC | PRN

## 2024-03-18 MED ORDER — DIPHENHYDRAMINE HCL 12.5 MG/5ML PO ELIX: 12.5000 mg | ORAL_SOLUTION | Freq: Four times a day (QID) | ORAL | Status: DC | PRN

## 2024-03-18 MED ORDER — SODIUM CHLORIDE 0.9% FLUSH: 9.0000 mL | INTRAVENOUS | Status: DC | PRN

## 2024-03-18 MED ORDER — MORPHINE SULFATE 1 MG/ML IV SOLN PCA
INTRAVENOUS | Status: DC
  Administered 2024-03-19: 13.5 mg via INTRAVENOUS
  Filled 2024-03-18 (×2): qty 30

## 2024-03-18 MED ORDER — POTASSIUM CHLORIDE 10 MEQ/100ML IV SOLN
INTRAVENOUS | Status: AC
  Filled 2024-03-18: qty 100

## 2024-03-18 MED ORDER — FENTANYL CITRATE PF 50 MCG/ML IJ SOSY
25.0000 ug | PREFILLED_SYRINGE | INTRAMUSCULAR | Status: DC | PRN
  Administered 2024-03-18: 25 ug via INTRAVENOUS
  Filled 2024-03-18: qty 1

## 2024-03-18 MED ORDER — POTASSIUM CHLORIDE 10 MEQ/100ML IV SOLN
INTRAVENOUS | Status: AC
  Administered 2024-03-18: 10 meq
  Filled 2024-03-18: qty 100

## 2024-03-18 MED ORDER — NALOXONE HCL 0.4 MG/ML IJ SOLN: 0.4000 mg | INTRAMUSCULAR | Status: DC | PRN

## 2024-03-18 MED ORDER — ONDANSETRON HCL 4 MG/2ML IJ SOLN: 4.0000 mg | Freq: Four times a day (QID) | INTRAMUSCULAR | Status: DC | PRN

## 2024-03-18 MED ORDER — DEXMEDETOMIDINE HCL IN NACL 200 MCG/50ML IV SOLN: 0.0000 ug/kg/h | INTRAVENOUS | Status: DC

## 2024-03-18 MED ORDER — DEXMEDETOMIDINE HCL IN NACL 200 MCG/50ML IV SOLN
INTRAVENOUS | Status: AC
  Administered 2024-03-18: 0.4 ug/kg/h via INTRAVENOUS
  Filled 2024-03-18: qty 50

## 2024-03-18 NOTE — Progress Notes (Signed)
   03/18/24 0805  Vent Select  Invasive or Noninvasive Invasive  Adult Vent Y  Airway 8 mm  Placement Date/Time: 03/17/24 1324   Placed By: ED Physician  Airway Device: Endotracheal Tube  Laryngoscope Blade: 4  ETT Types: Oral  Size (mm): 8 mm  Cuffed: Cuffed  Insertion attempts: 1  Airway Equipment: Stylet;Video Laryngoscope  Placement Conf...  Secured at (cm) 23 cm  Measured From Lips  Secured Location Right  Secured By English as a second language teacher No  Tube Holder Repositioned Yes  Prone position No  Head position Right  Cuff Pressure (cm H2O) Green OR 18-26 CmH2O  Site Condition Drainage (Comment)  Adult Ventilator Settings  Vent Type Servo i  Humidity HME  Vent Mode (S)  Spontaneous;CPAP;PSV  FiO2 (%) (S)  28 % (Weaned to 28%.)  Pressure Support (S)  5 cmH20  PEEP (S)  5 cmH20  Adult Ventilator Measurements  Peak Airway Pressure 10 L/min  Mean Airway Pressure 5 cmH20  Resp Rate Spontaneous 26 br/min  Resp Rate Total 26 br/min  Spont TV 491 mL  Measured Ve 8 L  Total PEEP 5 cmH20  SpO2 98 %  Adult Ventilator Alarms  Alarms On Y  Ve High Alarm 16 L/min  Ve Low Alarm 5 L/min  Resp Rate High Alarm 35 br/min  Resp Rate Low Alarm 10  PEEP Low Alarm 3 cmH2O  Press High Alarm 40 cmH2O  T Apnea 20 sec(s)  VAP Prevention  HOB> 30 Degrees Y  Daily Weaning Assessment  Daily Assessment of Readiness to Wean Wean protocol criteria met (SBT performed)  SBT Method (S)  CPAP 5 cm H20 and PS 5 cm H20  Weaning Start Time 0805  Breath Sounds  Bilateral Breath Sounds Clear;Diminished  Vent Respiratory Assessment  Level of Consciousness Alert  Respiratory Pattern (S)  Regular;Unlabored (Pt is talking to family members, setting alarms off, encouraged pt to rest and breathe.)  Patient Tolerance Tolerated well  Suction Method  Respiratory Interventions Airway suction;Oral suction  Oral Suctioning/Secretions  Suction Type Oral  Suction Device Yankauer  Secretion Amount  Small  Secretion Color White  Secretion Consistency Thin  Suction Tolerance Tolerated well  Suctioning Adverse Effects None  Airway Suctioning/Secretions  Suction Type ETT  Suction Device  Catheter  Secretion Amount Small  Secretion Color White  Secretion Consistency Thin  Suction Tolerance Tolerated well  Suctioning Adverse Effects None

## 2024-03-18 NOTE — Procedures (Signed)
 Extubation Procedure Note  Patient Details:   Name: [name] DOB: [birth year] MRN: [id number]   Airway Documentation:    Vent end date: 03/18/24 Vent end time: 0859 (Extubated per MD order.)   Evaluation  O2 sats: stable throughout Complications: No apparent complications Patient did tolerate procedure well. Bilateral Breath Sounds: Clear, Diminished   Yes  Extubated to 2 L [place], positive cuff leak prior to extubation, along with suctioning, white, thin, small amounts of secretions.   [name] Yacine Droz 03/18/2024, 9:03 AM

## 2024-03-18 NOTE — Plan of Care (Incomplete)
  Problem: Fluid Volume: Goal: Ability to maintain a balanced intake and output will improve Outcome: Progressing   Problem: Cardiac: Goal: Ability to maintain an adequate cardiac output will improve Outcome: Progressing   Problem: Fluid Volume: Goal: Ability to achieve a balanced intake and output will improve Outcome: Progressing

## 2024-03-18 NOTE — Inpatient Diabetes Management (Signed)
 Inpatient Diabetes Program Recommendations  AACE/ADA: New Consensus Statement on Inpatient Glycemic Control (2015)  Target Ranges:  Prepandial:   less than 140 mg/dL      Peak postprandial:   less than 180 mg/dL (1-2 hours)      Critically ill patients:  140 - 180 mg/dL   Lab Results  Component Value Date   GLUCAP 106 (H) 03/18/2024    Review of Glycemic Control  Diabetes history: No hx DM Outpatient Diabetes medications: None Current orders for Inpatient glycemic control: Novolog 0-15 Q4H  HgbA1C - pending Blood sugars 59-106 today  Inpatient Diabetes Program Recommendations:    Agree with orders.  Spoke with pt at bedside regarding ? New-onset DM. Blood sugars have been normal today. + family hx DM ? Whether metabolic acidosis vs new-onset DM. Awaiting HgbA1C results, explained what an HgbA1C is and goal of < 7%.  Recommend f/u with PCP regarding her blood sugar control. Discussed healthy eating and maintaining healthy weight.   Answered questions/concerns.  Thank you. Shona Brandy, RD, LDN, CDCES Inpatient Diabetes Coordinator [phone]

## 2024-03-18 NOTE — Progress Notes (Addendum)
 NAME:  [name], MRN:  [id number], DOB:  Feb 09, 1991, LOS: 1 ADMISSION DATE:  03/17/2024, CONSULTATION DATE:  03/18/24 REFERRING MD:  Levander, CHIEF COMPLAINT:  abdominal pain   History of Present Illness:  [name] is a 33 y.o. F with PMH significant for Hashimoto's thyroiditis off medication for several years, schizoaffective disorder and uterine fibroids who presented to the ED with severe lower abdominal pain and agitation.  Per history provided to the ED by husband pt has been in her usual state of health until the morning of admission when she called c/o lower abdominal pain, on menstrual cycle.  She was agitated to the point pt required intubation in the ED, was transiently hypotensive but improved with IVF.     Labs revealed lactic acid >15, WBC 19k, glucose 367, Hcg negative, metabolic panel and CT chest/abd/pelvis pending.  PCCM consulted for admission   Pertinent  Medical History   has a past medical history of Amenorrhea, secondary, Bipolar disorder (HCC), Galactorrhea, Hirsutism, Hyperprolactinemia (HCC), Hypothyroidism, Hypothyroidism, acquired, autoimmune, Pituitary tumor, and Thyroiditis, autoimmune.   Significant Hospital Events: Including procedures, antibiotic start and stop dates in addition to other pertinent events   7/2 altered and hyperglycemic, intubated, ICU admission 7/3 extubated and stable this AM  Interim History / Subjective:  No acute issues overnight, glucose improved without starting insulin gtt, extubated   Objective    Blood pressure 116/68, pulse 93, temperature 98.4 F (36.9 C), resp. rate (!) 26, height 5' 2 (1.575 m), weight 70.3 kg, SpO2 98%.    Vent Mode: Spontaneous;CPAP;PSV FiO2 (%):  [28 %-100 %] 28 % Set Rate:  [26 bmp-30 bmp] 26 bmp Vt Set:  [400 mL] 400 mL PEEP:  [5 cmH20] 5 cmH20 Pressure Support:  [5 cmH20] 5 cmH20 Plateau Pressure:  [15 cmH20-18 cmH20] 16 cmH20   Intake/Output Summary (Last 24 hours) at 03/18/2024 0818 Last  data filed at 03/18/2024 9344 Gross per 24 hour  Intake 5354.13 ml  Output 4250 ml  Net 1104.13 ml   Filed Weights   03/17/24 1425  Weight: 70.3 kg    General:  well nourished young awake and alert HEENT: MM pink/moist, sclera anicteric Neuro: alert, oriented and moving all extremities CV: s1s2 rrr, no m/r/g PULM:  clear bilaterally, no distress post-extubation GI: soft, diffusely TTP, incision site clean and dry Extremities: warm/dry, no edema     Resolved problem list  Lactic acidosis with leukocytosis, concern for sepsis Assessment and Plan    Acute Encephalopathy requiring intubation for airway protection Hemorrhagic shock secondary to bleeding uterine fibriod  -shock physiology resolved with resolution of intra-abdominal bleed -received 4 units PRBC's yesterday, Hgb 14.6>11.7, continue to trend  -lactic acid markedly elevated >15 on admission now cleared  -blood and urine cultures sent -stop abx given source of shock is hemorrhagic  --Maintain full vent support with SAT/SBT as tolerated -titrate Vent setting to maintain SpO2 greater than or equal to 90%. -HOB elevated 30 degrees. -Plateau pressures less than 30 cm H20.  -Follow chest x-ray, ABG prn.   -Bronchial hygiene and RT/bronchodilator protocol.    Hyperglycemia, appears to be new onset DM with DKA  -start Endotool -q4hr BMP -pH 6.9 got two amps of bicarb peri-intubation -Hg A1c -DM coordinator consult -NPO   AKI -creatinine 1.25, last labs several years ago but normal renal function at that time -follow renal indices after IVF, monitor UOP and avoid nephrotoxins   History of Hypothyroidism  -TSH pending    Schizoaffective  Disorder  -several psych admissions, doesn't appear that she has a hx of SI, not on home medications   Best Practice (right click and Reselect all SmartList Selections daily)   Diet/type: NPO DVT prophylaxis SCD Pressure ulcer(s): N/A GI prophylaxis: PPI Lines: Central  line Foley:  Yes, and it is still needed Code Status:  full code Last date of multidisciplinary goals of care discussion [family updated at the bedside ]  Labs   CBC: Recent Labs  Lab 03/17/24 1239 03/17/24 1617 03/17/24 1712 03/17/24 1750 03/18/24 0601  WBC 19.4*  --   --  31.9* 16.6*  NEUTROABS 10.2*  --   --   --   --   HGB 8.3* 6.1* 10.9* 14.6 11.7*  HCT 29.7* 18.0* 32.0* 43.2 34.1*  MCV 99.7  --   --  88.0 85.7  PLT 311  --   --  87* 87*    Basic Metabolic Panel: Recent Labs  Lab 03/17/24 1239 03/17/24 1515 03/17/24 1617 03/17/24 1712 03/17/24 1750 03/18/24 0601  NA 140 138 140 143 142 139  K 3.5 3.4* 4.1 3.6 3.8 3.8  CL 104 105  --   --  111 113*  CO2 11* 14*  --   --  17* 19*  GLUCOSE 470* 374*  --   --  180* 104*  BUN 8 9  --   --  9 9  CREATININE 1.25* 1.54*  --   --  0.89 0.92  CALCIUM 8.7* 7.0*  --   --  7.7* 7.5*  MG  --   --   --   --  1.5* 2.5*  PHOS  --   --   --   --  4.6 3.0   GFR: Estimated Creatinine Clearance (by C-G formula based on SCr of 0.92 mg/dL) Female: 17.7 mL/min Female: 101.1 mL/min Recent Labs  Lab 03/17/24 1239 03/17/24 1248 03/17/24 1524 03/17/24 1750 03/18/24 0601  WBC 19.4*  --   --  31.9* 16.6*  LATICACIDVEN  --  >15.0* 14.8* 6.5* 1.1    Liver Function Tests: Recent Labs  Lab 03/17/24 1239  AST 24  ALT 22  ALKPHOS 44  BILITOT 0.7  PROT 5.3*  ALBUMIN 3.0*   No results for input(s): LIPASE, AMYLASE in the last 168 hours. No results for input(s): AMMONIA in the last 168 hours.  ABG    Component Value Date/Time   PHART 7.47 (H) 03/18/2024 0429   PCO2ART 28 (L) 03/18/2024 0429   PO2ART 184 (H) 03/18/2024 0429   HCO3 20.4 03/18/2024 0429   TCO2 16 (L) 03/17/2024 1712   ACIDBASEDEF 2.0 03/18/2024 0429   O2SAT 100 03/18/2024 0429     Coagulation Profile: Recent Labs  Lab 03/17/24 1515  INR 2.1*    Cardiac Enzymes: No results for input(s): CKTOTAL, CKMB, CKMBINDEX, TROPONINI in the last  168 hours.  HbA1C: No results found for: HGBA1C  CBG: Recent Labs  Lab 03/17/24 2007 03/17/24 2141 03/17/24 2358 03/18/24 0402 03/18/24 0751  GLUCAP 107* 103* 123* 93 89    Review of Systems:   Unable to obtain   Past Medical History:  [name],  has a past medical history of Amenorrhea, secondary, Bipolar disorder (HCC), Galactorrhea, Hirsutism, Hyperprolactinemia (HCC), Hypothyroidism, Hypothyroidism, acquired, autoimmune, Pituitary tumor, and Thyroiditis, autoimmune.   Surgical History:   Past Surgical History:  Procedure Laterality Date   None       Social History:   reports that [name] is a non-smoker but has  been exposed to tobacco smoke. [name] has never used smokeless tobacco. [name] reports current alcohol use. [name] reports current drug use. Drug: Marijuana.   Family History:  [name]'s family history includes Anemia in [name]'s father; Cancer in [name]'s maternal grandfather; Diabetes in [name]'s maternal grandfather and paternal grandfather; Hypertension in [name]'s father; Obesity in [name]'s mother and paternal aunt. There is no history of Thyroid  disease, Kidney disease, or Bipolar disorder.   Allergies No Known Allergies   Home Medications  Prior to Admission medications   Not on File     Critical care time:  35 minutes      CRITICAL CARE Performed by: Leita SAUNDERS [name]   Total critical care time: 35 minutes  Critical care time was exclusive of separately billable procedures and treating other patients.  Critical care was necessary to treat or prevent imminent or life-threatening deterioration.  Critical care was time spent personally by me on the following activities: development of treatment plan with patient and/or surrogate as well as nursing, discussions with consultants, evaluation of patient's response to treatment, examination of patient, obtaining history from patient or surrogate, ordering and performing treatments and  interventions, ordering and review of laboratory studies, ordering and review of radiographic studies, pulse oximetry and re-evaluation of patient's condition.    Leita SAUNDERS Penelopi Mikrut, PA-C [facility] See Amion for pager If no response to pager , please call [phone] until 7pm After 7:00 pm call Elink  [phone]

## 2024-03-18 NOTE — Progress Notes (Signed)
 1 Day Post-Op  Subjective: Intubated but awake and conversant, c/o abd pain and discomfort from the ET tube  Objective: Vital signs in last 24 hours: Temp:  [92.7 F (33.7 C)-99.9 F (37.7 C)] 98.4 F (36.9 C) (07/03 0600) Pulse Rate:  [93-123] 93 (07/03 0600) Resp:  [0-30] 26 (07/03 0600) BP: (78-138)/(46-89) 116/68 (07/03 0600) SpO2:  [98 %-100 %] 98 % (07/03 0805) Arterial Line BP: (90-127)/(70-95) 90/79 (07/03 0600) FiO2 (%):  [28 %-100 %] 28 % (07/03 0805) Weight:  [70.3 kg] 70.3 kg (07/02 1425)   Intake/Output from previous day: 07/02 0701 - 07/03 0700 In: 5354.1 [I.V.:2865.9; Blood:1652; IV Piggyback:826.2] Out: 4250 [Urine:2250; Blood:2000] Intake/Output this shift: No intake/output data recorded.   General appearance: alert and cooperative GI: soft  Incision: no significant drainage, dressing intact  Lab Results:  Recent Labs    03/17/24 1750 03/18/24 0601  WBC 31.9* 16.6*  HGB 14.6 11.7*  HCT 43.2 34.1*  PLT 87* 87*   BMET Recent Labs    03/17/24 1750 03/18/24 0601  NA 142 139  K 3.8 3.8  CL 111 113*  CO2 17* 19*  GLUCOSE 180* 104*  BUN 9 9  CREATININE 0.89 0.92  CALCIUM 7.7* 7.5*   PT/INR Recent Labs    03/17/24 1515  LABPROT 24.9*  INR 2.1*   ABG Recent Labs    03/17/24 1755 03/18/24 0429  PHART 7.42 7.47*  HCO3 18.5* 20.4    MEDS, Scheduled  sodium chloride    Intravenous Once   Chlorhexidine Gluconate Cloth  6 each Topical Daily   docusate  100 mg Per Tube BID   fentaNYL  (SUBLIMAZE ) injection  25-50 mcg Intravenous Once   insulin aspart  0-15 Units Subcutaneous Q4H   mouth rinse  15 mL Mouth Rinse Q2H   pantoprazole (PROTONIX) IV  40 mg Intravenous Daily   polyethylene glycol  17 g Per Tube Daily   sodium chloride  flush  10-40 mL Intracatheter Q12H    Studies/Results: DG Abd 1 View Result Date: 03/17/2024 CLINICAL DATA:  Nasogastric tube placement. EXAM: ABDOMEN - 1 VIEW COMPARISON:  CT earlier today FINDINGS: Tip and  side port of the enteric tube below the diaphragm in the stomach. Midline skin staples. IMPRESSION: Tip and side port of the enteric tube below the diaphragm in the stomach. Electronically Signed   By: Andrea Gasman M.D.   On: 03/17/2024 19:59   CT CHEST ABDOMEN PELVIS W CONTRAST Result Date: 03/17/2024 CLINICAL DATA:  Sepsis * Tracking Code: BO * EXAM: CT CHEST, ABDOMEN, AND PELVIS WITH CONTRAST TECHNIQUE: Multidetector CT imaging of the chest, abdomen and pelvis was performed following the standard protocol during bolus administration of intravenous contrast. RADIATION DOSE REDUCTION: This exam was performed according to the departmental dose-optimization program which includes automated exposure control, adjustment of the mA and/or kV according to patient size and/or use of iterative reconstruction technique. CONTRAST:  OMNIPAQUE  IOHEXOL  300 MG/ML  SOLN COMPARISON:  08/18/2020 FINDINGS: CT CHEST FINDINGS Cardiovascular: No significant vascular findings. Normal heart size. No pericardial effusion. Mediastinum/Nodes: No enlarged mediastinal, hilar, or axillary lymph nodes. Thymic remnant in the anterior mediastinum. Esophagus is distended and fluid-filled with esophagogastric tube, tip and side port below the diaphragm. Lungs/Pleura: Endotracheal intubation, tube tip above the carina. Irregular bibasilar atelectasis or consolidation. No pleural effusion or pneumothorax. Musculoskeletal: No chest wall abnormality. No acute osseous findings. CT ABDOMEN PELVIS FINDINGS Hepatobiliary: No solid liver abnormality is seen. No gallstones, gallbladder wall thickening, or biliary dilatation. Pancreas:  Unremarkable. No pancreatic ductal dilatation or surrounding inflammatory changes. Spleen: Normal in size without significant abnormality. Adrenals/Urinary Tract: Adrenal glands are unremarkable. Kidneys are normal, without renal calculi, solid lesion, or hydronephrosis. Bladder is unremarkable. Stomach/Bowel:  Stomach is within normal limits. Appendix appears normal. No evidence of bowel wall thickening, distention, or inflammatory changes. Vascular/Lymphatic: Flattened IVC, concerning for hypovolemia. No enlarged abdominal or pelvic lymph nodes. Reproductive: Extremely bulky fibroid uterus, significantly increased in size compared to prior examination, overall dimensions at least 21.5 x 11.5 x 13.8 cm (series 11, image 95, series 3, image 84). Other: No abdominal wall hernia or abnormality. Large volume intermediate attenuation fluid and heterogeneous, layering blood product throughout the abdomen and pelvis. Peritoneal and omental thickening throughout the ventral abdomen (series 3, image 69). Musculoskeletal: No acute osseous findings. IMPRESSION: 1. Large volume intermediate attenuation fluid and heterogeneous, layering blood product throughout the abdomen and pelvis, consistent with hemoperitoneum although of uncertain source. Trauma is generally by far the most common etiology of abdominal hemorrhage particularly in this demographic however reportedly there is no history of trauma. Spontaneous hemorrhage from uterine fibroids is a rare, although reported complication. 2. Extremely bulky fibroid uterus, significantly increased in size compared to prior examination, overall dimensions at least 21.5 x 11.5 x 13.8 cm and particularly marked enlargement of a fibroid of the uterine fundus. This may reflect a benign fibroid although leiomyosarcoma is not excluded especially given other constellation of findings. 3. Peritoneal and omental thickening throughout the ventral abdomen, of uncertain significance although suspicious for peritoneal malignancy. 4. Irregular bibasilar atelectasis or consolidation, most suggestive of aspiration and/or chronic sequelae thereof given the presence of fluid within the esophagus. 5. Endotracheal and esophagogastric intubation. Findings reported by telephone to Tylene Paris, [place], 2:37 p.m.  03/17/2024 Electronically Signed   By: Marolyn JONETTA Jaksch M.D.   On: 03/17/2024 14:42   CT Head Wo Contrast Result Date: 03/17/2024 CLINICAL DATA:  Provided history: Headache, neuro deficit. Mental status change, unknown cause. EXAM: CT HEAD WITHOUT CONTRAST TECHNIQUE: Contiguous axial images were obtained from the base of the skull through the vertex without intravenous contrast. RADIATION DOSE REDUCTION: This exam was performed according to the departmental dose-optimization program which includes automated exposure control, adjustment of the mA and/or kV according to patient size and/or use of iterative reconstruction technique. COMPARISON:  Brain MRI 12/27/2010. FINDINGS: Moderately motion degraded examination. Within this limitation, findings are as follows. Brain: Cerebral volume is normal. The small pituitary lesion demonstrated on the prior brain MRI of 12/27/2010 cannot be reassessed on this non-contrast head CT. No evidence of an intracranial mass elsewhere. There is no acute intracranial hemorrhage. No demarcated cortical infarct. No extra-axial fluid collection. No midline shift. Vascular: No hyperdense vessel. Skull: No calvarial fracture or aggressive osseous lesion. Sinuses/Orbits: No mass or acute finding within the imaged orbits. No significant paranasal sinus disease at the imaged levels. IMPRESSION: 1. Motion degraded examination. 2. Within this limitation, no acute intracranial abnormality is identified. 3. Please note, the small pituitary lesion demonstrated on the prior brain MRI of 12/27/2010 cannot be reassessed on this non-contrast head CT. This would be best reassessed with a pituitary protocol brain MRI (with and without contrast). Electronically Signed   By: Rockey Childs D.O.   On: 03/17/2024 14:35   DG Chest Portable 1 View Result Date: 03/17/2024 CLINICAL DATA:  POST INTUBATION EXAM: PORTABLE CHEST - 1 VIEW COMPARISON:  August 20, 2020 FINDINGS: Well-positioned endotracheal tube  terminating in the mid trachea. Esophagogastric tube is  well positioned terminating in the stomach. Lower lung volumes. No focal airspace consolidation, pleural effusion, or pneumothorax. No cardiomegaly. IMPRESSION: Well-positioned endotracheal and esophagogastric tubes. No pneumothorax. Electronically Signed   By: Rogelia Myers M.D.   On: 03/17/2024 14:32    Assessment: s/p Procedure(s): LAPAROTOMY, EXPLORATORY, myomectomy Patient Active Problem List   Diagnosis Date Noted   Abdominal pain 03/17/2024   Hyperglycemia 03/17/2024   Acidosis 03/17/2024   Delirium 03/17/2024   Encephalopathy acute 03/17/2024   Hepatomegaly 08/19/2020   Hyperbilirubinemia 08/19/2020   Pyelonephritis 08/18/2020   Hypothyroidism, acquired, autoimmune    Thyroiditis, autoimmune    Galactorrhea    Hyperprolactinemia (HCC)    Pituitary tumor    Amenorrhea, secondary    Hirsutism    Bipolar disorder (HCC)    Other specified acquired hypothyroidism 01/31/2011   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

## 2024-03-19 ENCOUNTER — Other Ambulatory Visit: Payer: Self-pay

## 2024-03-19 DIAGNOSIS — N179 Acute kidney failure, unspecified: Secondary | ICD-10-CM | POA: Diagnosis present

## 2024-03-19 DIAGNOSIS — D259 Leiomyoma of uterus, unspecified: Secondary | ICD-10-CM | POA: Diagnosis present

## 2024-03-19 DIAGNOSIS — R578 Other shock: Secondary | ICD-10-CM

## 2024-03-19 DIAGNOSIS — R739 Hyperglycemia, unspecified: Secondary | ICD-10-CM

## 2024-03-19 DIAGNOSIS — R410 Disorientation, unspecified: Secondary | ICD-10-CM

## 2024-03-19 DIAGNOSIS — G9340 Encephalopathy, unspecified: Secondary | ICD-10-CM

## 2024-03-19 DIAGNOSIS — E063 Autoimmune thyroiditis: Secondary | ICD-10-CM

## 2024-03-19 DIAGNOSIS — R1084 Generalized abdominal pain: Secondary | ICD-10-CM

## 2024-03-19 DIAGNOSIS — E872 Acidosis, unspecified: Secondary | ICD-10-CM

## 2024-03-19 DIAGNOSIS — E876 Hypokalemia: Secondary | ICD-10-CM | POA: Diagnosis present

## 2024-03-19 LAB — BASIC METABOLIC PANEL WITH GFR
Anion gap: 12 (ref 5–15)
BUN: <5 mg/dL — ABNORMAL LOW (ref 6–20)
CO2: 21 mmol/L — ABNORMAL LOW (ref 22–32)
Calcium: 7.9 mg/dL — ABNORMAL LOW (ref 8.9–10.3)
Chloride: 102 mmol/L (ref 98–111)
Creatinine, Ser: 0.58 mg/dL (ref 0.44–1.00)
GFR, Estimated: >60 mL/min (ref >60)
Glucose, Bld: 87 mg/dL (ref 70–99)
Potassium: 3.1 mmol/L — ABNORMAL LOW (ref 3.5–5.1)
Sodium: 135 mmol/L (ref 135–145)

## 2024-03-19 LAB — GLUCOSE, CAPILLARY
Glucose-Capillary: 80 mg/dL (ref 70–99)
Glucose-Capillary: 75 mg/dL (ref 70–99)
Glucose-Capillary: 84 mg/dL (ref 70–99)
Glucose-Capillary: 86 mg/dL (ref 70–99)
Glucose-Capillary: 87 mg/dL (ref 70–99)

## 2024-03-19 LAB — CBC
HCT: 31.3 % — ABNORMAL LOW (ref 36.0–46.0)
Hemoglobin: 10.5 g/dL — ABNORMAL LOW (ref 12.0–15.0)
MCH: 29.3 pg (ref 26.0–34.0)
MCHC: 33.5 g/dL (ref 30.0–36.0)
MCV: 87.4 fL (ref 80.0–100.0)
Platelets: 76 K/uL — ABNORMAL LOW (ref 150–400)
RBC: 3.58 MIL/uL — ABNORMAL LOW (ref 3.87–5.11)
RDW: 16.9 % — ABNORMAL HIGH (ref 11.5–15.5)
WBC: 17.1 K/uL — ABNORMAL HIGH (ref 4.0–10.5)
nRBC: 0 % (ref 0.0–0.2)

## 2024-03-19 LAB — MAGNESIUM: Magnesium: 1.7 mg/dL (ref 1.7–2.4)

## 2024-03-19 MED ORDER — ACETAMINOPHEN 650 MG RE SUPP: 650.0000 mg | Freq: Four times a day (QID) | RECTAL | Status: DC | PRN

## 2024-03-19 MED ORDER — HYDROMORPHONE HCL 1 MG/ML IJ SOLN: 0.5000 mg | INTRAMUSCULAR | Status: DC | PRN

## 2024-03-19 MED ORDER — METHOCARBAMOL 1000 MG/10ML IJ SOLN
500.0000 mg | Freq: Three times a day (TID) | INTRAMUSCULAR | Status: DC
  Administered 2024-03-19 – 2024-03-20 (×4): 500 mg via INTRAVENOUS
  Filled 2024-03-19 (×4): qty 10

## 2024-03-19 MED ORDER — DM-GUAIFENESIN ER 30-600 MG PO TB12
1.0000 | ORAL_TABLET | Freq: Two times a day (BID) | ORAL | Status: DC
  Administered 2024-03-19 – 2024-03-24 (×7): 1 via ORAL
  Filled 2024-03-19 (×8): qty 1

## 2024-03-19 MED ORDER — HYDROMORPHONE 1 MG/ML IV SOLN
INTRAVENOUS | Status: DC
  Administered 2024-03-19: 1.5 mg via INTRAVENOUS
  Administered 2024-03-19: 2 mg via INTRAVENOUS
  Administered 2024-03-19: 30 mg via INTRAVENOUS
  Administered 2024-03-19: 2.7 mg via INTRAVENOUS
  Administered 2024-03-20: 2.4 mg via INTRAVENOUS
  Administered 2024-03-20: 4.2 mg via INTRAVENOUS
  Administered 2024-03-20: 0.3 mg via INTRAVENOUS
  Administered 2024-03-20: 1.4 mg via INTRAVENOUS
  Administered 2024-03-20: 1.5 mg via INTRAVENOUS
  Administered 2024-03-20: 1.8 mg via INTRAVENOUS
  Administered 2024-03-21: 4.2 mg via INTRAVENOUS
  Administered 2024-03-21: 3.3 mg via INTRAVENOUS
  Administered 2024-03-21: 3 mg via INTRAVENOUS
  Administered 2024-03-21: 1.8 mg via INTRAVENOUS
  Administered 2024-03-21: 30 mg via INTRAVENOUS
  Administered 2024-03-21: 3.9 mg via INTRAVENOUS
  Administered 2024-03-21: 1.5 mg via INTRAVENOUS
  Administered 2024-03-22: 4.2 mg via INTRAVENOUS
  Administered 2024-03-22: 4.5 mg via INTRAVENOUS
  Administered 2024-03-22: 5.7 mg via INTRAVENOUS
  Filled 2024-03-19 (×2): qty 30

## 2024-03-19 MED ORDER — CHLORHEXIDINE GLUCONATE CLOTH 2 % EX PADS
6.0000 | MEDICATED_PAD | Freq: Every day | CUTANEOUS | Status: DC
  Administered 2024-03-19 – 2024-03-23 (×5): 6 via TOPICAL

## 2024-03-19 MED ORDER — PIPERACILLIN-TAZOBACTAM 3.375 G IVPB
3.3750 g | Freq: Three times a day (TID) | INTRAVENOUS | Status: AC
  Administered 2024-03-19 – 2024-03-23 (×14): 3.375 g via INTRAVENOUS
  Filled 2024-03-19 (×14): qty 50

## 2024-03-19 MED ORDER — HYDROMORPHONE 1 MG/ML IV SOLN: INTRAVENOUS | Status: DC

## 2024-03-19 MED ORDER — POTASSIUM CHLORIDE CRYS ER 20 MEQ PO TBCR: 40.0000 meq | EXTENDED_RELEASE_TABLET | Freq: Once | ORAL | Status: DC

## 2024-03-19 MED ORDER — DIPHENHYDRAMINE HCL 50 MG/ML IJ SOLN: 12.5000 mg | Freq: Four times a day (QID) | INTRAMUSCULAR | Status: DC | PRN

## 2024-03-19 MED ORDER — NALOXONE HCL 0.4 MG/ML IJ SOLN: 0.4000 mg | INTRAMUSCULAR | Status: DC | PRN

## 2024-03-19 MED ORDER — MAGNESIUM SULFATE 2 GM/50ML IV SOLN
2.0000 g | Freq: Once | INTRAVENOUS | Status: AC
  Administered 2024-03-19: 2 g via INTRAVENOUS
  Filled 2024-03-19: qty 50

## 2024-03-19 MED ORDER — HYDROMORPHONE HCL 1 MG/ML IJ SOLN: 1.0000 mg | INTRAMUSCULAR | Status: DC | PRN

## 2024-03-19 MED ORDER — SODIUM CHLORIDE 0.9% FLUSH: 9.0000 mL | INTRAVENOUS | Status: DC | PRN

## 2024-03-19 MED ORDER — POTASSIUM CHLORIDE 10 MEQ/100ML IV SOLN
10.0000 meq | INTRAVENOUS | Status: AC
  Administered 2024-03-19 (×4): 10 meq via INTRAVENOUS
  Filled 2024-03-19 (×4): qty 100

## 2024-03-19 MED ORDER — CARMEX CLASSIC LIP BALM EX OINT
1.0000 | TOPICAL_OINTMENT | CUTANEOUS | Status: DC | PRN
  Administered 2024-03-19: 1 via TOPICAL
  Filled 2024-03-19: qty 10

## 2024-03-19 MED ORDER — LACTATED RINGERS IV SOLN: INTRAVENOUS | Status: DC

## 2024-03-19 MED ORDER — DIPHENHYDRAMINE HCL 12.5 MG/5ML PO ELIX: 12.5000 mg | ORAL_SOLUTION | Freq: Four times a day (QID) | ORAL | Status: DC | PRN

## 2024-03-19 MED ORDER — ONDANSETRON HCL 4 MG/2ML IJ SOLN
4.0000 mg | Freq: Four times a day (QID) | INTRAMUSCULAR | Status: DC | PRN
  Administered 2024-03-21: 4 mg via INTRAVENOUS
  Filled 2024-03-19: qty 2

## 2024-03-19 MED ORDER — BISACODYL 10 MG RE SUPP
10.0000 mg | Freq: Every day | RECTAL | Status: DC
  Administered 2024-03-19: 10 mg via RECTAL
  Filled 2024-03-19: qty 1

## 2024-03-19 NOTE — Progress Notes (Signed)
 NG tube residuals checked at 08:10. 10 cc removed from NG tube. NG tubed clamped.

## 2024-03-19 NOTE — Progress Notes (Signed)
 03/19/2024  [name] [id number] [birth year]  CARE TEAM: PCP: Patient, No Pcp Per  Outpatient Care Team: Patient Care Team: Patient, No Pcp Per as PCP - General (General Practice)  Inpatient Treatment Team: Treatment Team:  Caleen Qualia, MD Pccm, Md, MD Ccs, Md, MD Ilah Sharee BRAVO, [place] Bobbette Civil, MD Netta Delon SAUNDERS, [place] DEL, PT Keren Shadow D, RN Sharlot Wanda BRAVO, [facility]   Problem List:   Principal Problem:   Abdominal pain Active Problems:   Hyperglycemia   Acidosis   Delirium   Encephalopathy acute   Hemorrhagic shock (HCC)   Fibroid uterus   03/17/2024  POST-OPERATIVE DIAGNOSIS:  Bleeding fibroid tumor   PROCEDURE:  EXPLORATORY LAPAROTOMY, myomectomy   Surgeon: Debby Hila, MD  OR FINDINGS: Bleeding fibroid tumor arising from the right lateral uterine wall.    Assessment [facility] Stay = 2 days) 2 Days Post-Op    Stabilizing but with postop ileus    Plan:  -Tried NG tube clamping trial but still with significant output.  Monitor.  Try suppositories.  Still struggling with significant pain.  Morphine  partially helping.  Will switch to Dilaudid  PCA.  Placed on standing methocarbamol  as well.  Hypokalemia.  Replace IV.  I would not trust the digestive tract for any medications at this time until patient has return of bowel function  Hopefully she starts having flatus and tolerated NG tube clamping, can remove NG tube and gradually advance diet.  She does not have any evidence of any recurrent bleeding or hemorrhagic shock.  Hopefully can transition out of the ICU soon.  Most likely okay to remove arterial and central lines but defer to critical care. -monitor electrolytes & replace as needed  Keep K>4, Mg>2, Phos>3  -VTE prophylaxis- SCDs.  Anticoagulation prophyllaxis SQ as appropriate  -mobilize as tolerated to help recovery.  Enlist therapies in moderate/high risk patients as appropriate  I updated the patient's  status to the patient, significant other at bedside, ICU nurse.  Recommendations were made.  Questions were answered.  They expressed understanding & appreciation.  -Disposition: TBD.  OK for floor bed per surgery standpoint once lines removed       I reviewed nursing notes, Consultant ICU notes, last 24 h vitals and pain scores, last 48 h intake and output, last 24 h labs and trends, and last 24 h imaging results.  I have reviewed this patient's available data, including medical history, events of note, test results, etc as part of my evaluation.   A significant portion of that time was spent in counseling. Care during the described time interval was provided by me.  This care required moderate level of medical decision making.  03/19/2024    Subjective: (Chief complaint)  Hemodynamically stable through the night.  Tried NG tube clamping trials but had worsening bloating with increased return.  Significant other at bedside.  ICU nurse in room.  Try to use morphine  PCA but only partially helping.  Afraid to get up and out of bed.  Concerned about pain.  Did not sleep well at all  Objective:  Vital signs:  Vitals:   03/19/24 0447 03/19/24 0500 03/19/24 0600 03/19/24 0700  BP:  123/78 (!) 141/105 136/81  Pulse:  91 (!) 107 93  Resp:  19 18 18   Temp: 98.5 F (36.9 C)     TempSrc: Oral     SpO2:  96% 96% 95%  Weight:  73.4 kg    Height:  Last BM Date :  (PTA)  Intake/Output   Yesterday:  07/03 0701 - 07/04 0700 In: 306.7 [I.V.:128; IV Piggyback:178.7] Out: 2230 [Urine:1750; Emesis/NG output:480] This shift:  No intake/output data recorded.  Bowel function:  Flatus: No  BM:  No  Drain: NG tube with thinly bilious effluent   Physical Exam:  General: Pt awake/alert in no acute distress.  Tired but not toxic Eyes: PERRL, normal EOM.  Sclera clear.  No icterus Neuro: CN II-XII intact w/o focal sensory/motor deficits. Lymph: No head/neck/groin  lymphadenopathy Psych:  No delerium/psychosis/paranoia.  Oriented x 4 HENT: Normocephalic, Mucus membranes moist.  No thrush Neck: Supple, No tracheal deviation.  No obvious thyromegaly Chest: No pain to chest wall compression.  Good respiratory excursion.  No audible wheezing CV:  Pulses intact.  Regular rhythm.  No major extremity edema MS: Normal AROM mjr joints.  No obvious deformity  Abdomen: Somewhat firm.  Moderately distended.  Mildly tender at incisions only.  No evidence of peritonitis.  No incarcerated hernias.  Dressing with old blood.  I removed.  Sensitive skin but she tolerated removal  Ext:  No deformity.  No mjr edema.  No cyanosis Skin: No petechiae / purpurea.  No major sores.  Warm and dry    Results:   Cultures: Recent Results (from the past 720 hours)  Blood culture (routine x 2)     Status: None (Preliminary result)   Collection Time: 03/17/24 12:39 PM   Specimen: BLOOD  Result Value Ref Range Status   Specimen Description   Final    BLOOD RIGHT ANTECUBITAL Performed at [facility], [address] [place], [place] [address]    Special Requests   Final    BOTTLES DRAWN AEROBIC AND ANAEROBIC Blood Culture results may not be optimal due to an inadequate volume of blood received in culture bottles Performed at [facility], [address] [place], [place] [address]    Culture   Final    NO GROWTH 2 DAYS Performed at [facility], [address] [place], [place] [address]    Report Status PENDING  Incomplete  Blood culture (routine x 2)     Status: None (Preliminary result)   Collection Time: 03/17/24 12:57 PM   Specimen: BLOOD  Result Value Ref Range Status   Specimen Description   Final    BLOOD LEFT ANTECUBITAL Performed at [facility], [address], [place], [place] [address]    Special Requests   Final    BOTTLES DRAWN AEROBIC AND ANAEROBIC Blood Culture results may not be optimal  due to an inadequate volume of blood received in culture bottles Performed at [facility], [address], [place], [place] [address]    Culture   Final    NO GROWTH 2 DAYS Performed at [facility], [address] [place], [place] [address]    Report Status PENDING  Incomplete  Urine Culture     Status: None   Collection Time: 03/17/24  3:00 PM   Specimen: Urine, Catheterized  Result Value Ref Range Status   Specimen Description   Final    URINE, CATHETERIZED Performed at [facility], [address] [place], [place] [address]    Special Requests   Final    NONE Performed at [facility], [address], [place], [place] [address]    Culture   Final    NO GROWTH Performed at  [facility], [address], [place], [place] [address]    Report Status 03/18/2024 FINAL  Final  MRSA Next Gen by PCR, Nasal     Status: None   Collection Time: 03/17/24  8:13 PM   Specimen: Nasal Mucosa; Nasal Swab  Result Value Ref Range Status   MRSA by PCR Next Gen NOT DETECTED NOT DETECTED Final    Comment: (NOTE) The GeneXpert MRSA Assay (FDA approved for NASAL specimens only), is one component of a comprehensive MRSA colonization surveillance program. It is not intended to diagnose MRSA infection nor to guide or monitor treatment for MRSA infections. Test performance is not FDA approved in patients less than 28 years old. Performed at [facility], [address], [place], [place] [address]     Labs: Results for orders placed or performed during the hospital encounter of 03/17/24 (from the past 48 hours)  CBG monitoring, ED     Status: Abnormal   Collection Time: 03/17/24 12:26 PM  Result Value Ref Range   Glucose-Capillary 367 (H) 70 - 99 mg/dL    Comment: Glucose reference range applies only to samples taken after fasting for at least 8 hours.  CBC with Differential     Status: Abnormal    Collection Time: 03/17/24 12:39 PM  Result Value Ref Range   WBC 19.4 (H) 4.0 - 10.5 K/uL   RBC 2.98 (L) 3.87 - 5.11 MIL/uL   Hemoglobin 8.3 (L) 12.0 - 15.0 g/dL   HCT 70.2 (L) 63.9 - 53.9 %   MCV 99.7 80.0 - 100.0 fL   MCH 27.9 26.0 - 34.0 pg   MCHC 27.9 (L) 30.0 - 36.0 g/dL   RDW 85.3 88.4 - 84.4 %   Platelets 311 150 - 400 K/uL   nRBC 0.4 (H) 0.0 - 0.2 %   Neutrophils Relative % 51 %   Neutro Abs 10.2 (H) 1.7 - 7.7 K/uL   Lymphocytes Relative 36 %   Lymphs Abs 7.0 (H) 0.7 - 4.0 K/uL   Monocytes Relative 6 %   Monocytes Absolute 1.1 (H) 0.1 - 1.0 K/uL   Eosinophils Relative 1 %   Eosinophils Absolute 0.2 0.0 - 0.5 K/uL   Basophils Relative 1 %   Basophils Absolute 0.1 0.0 - 0.1 K/uL   Immature Granulocytes 5 %   Abs Immature Granulocytes 0.87 (H) 0.00 - 0.07 K/uL    Comment: Performed at [facility], [address] [place], [place] [address]  Comprehensive metabolic panel     Status: Abnormal   Collection Time: 03/17/24 12:39 PM  Result Value Ref Range   Sodium 140 135 - 145 mmol/L    Comment: ELECTROLYTES REPEATED TO VERIFY   Potassium 3.5 3.5 - 5.1 mmol/L   Chloride 104 98 - 111 mmol/L    Comment: ELECTROLYTES REPEATED TO VERIFY   CO2 11 (L) 22 - 32 mmol/L    Comment: ELECTROLYTES REPEATED TO VERIFY   Glucose, Bld 470 (H) 70 - 99 mg/dL    Comment: Glucose reference range applies only to samples taken after fasting for at least 8 hours.   BUN 8 6 - 20 mg/dL   Creatinine, Ser 8.74 (H) 0.44 - 1.00 mg/dL   Calcium  8.7 (L) 8.9 - 10.3 mg/dL   Total Protein 5.3 (L) 6.5 - 8.1 g/dL   Albumin 3.0 (L) 3.5 - 5.0 g/dL   AST 24 15 - 41 U/L   ALT 22 0 - 44 U/L  Alkaline Phosphatase 44 38 - 126 U/L   Total Bilirubin 0.7 0.0 - 1.2 mg/dL   GFR, Estimated 59 (L) >60 mL/min    Comment: (NOTE) Calculated using the CKD-EPI Creatinine Equation (2021)    Anion gap 25 (H) 5 - 15    Comment: Performed at [facility], [address],  [place], [place] [address]  Ethanol     Status: None   Collection Time: 03/17/24 12:39 PM  Result Value Ref Range   Alcohol, Ethyl (B) <15 <15 mg/dL    Comment: (NOTE) For medical purposes only. Performed at [facility], [address], [place], [place] [address]   hCG, serum, qualitative     Status: None   Collection Time: 03/17/24 12:39 PM  Result Value Ref Range   Preg, Serum NEGATIVE NEGATIVE    Comment:        THE SENSITIVITY OF THIS METHODOLOGY IS >10 mIU/mL. Performed at [facility], [address], [place], [place] [address]   TSH     Status: Abnormal   Collection Time: 03/17/24 12:39 PM  Result Value Ref Range   TSH 9.037 (H) 0.350 - 4.500 uIU/mL    Comment: Performed by a 3rd Generation assay with a functional sensitivity of <=0.01 uIU/mL. Performed at [facility], [address], [place], [place] [address]   Troponin I (High Sensitivity)     Status: Abnormal   Collection Time: 03/17/24 12:39 PM  Result Value Ref Range   Troponin I (High Sensitivity) 296 (HH) <18 ng/L    Comment: CRITICAL RESULT CALLED TO, READ BACK BY AND VERIFIED WITH BURGESS, A. RN AT 03/17/2024 1359 BY JEREMY C (NOTE) Elevated high sensitivity troponin I (hsTnI) values and significant  changes across serial measurements may suggest ACS but many other  chronic and acute conditions are known to elevate hsTnI results.  Refer to the Links section for chest pain algorithms and additional  guidance. Performed at [facility], [address], [place], [place] [address]   Blood gas, venous     Status: Abnormal   Collection Time: 03/17/24 12:39 PM  Result Value Ref Range   pH, Ven <6.95 (LL) 7.25 - 7.43    Comment: CRITICAL RESULT CALLED TO, READ BACK BY AND VERIFIED WITH: BURGESS, A. RN AT 03/17/2024 1310 BY JEREMY C    pCO2, Ven 52 44 - 60 mmHg   pO2, Ven <31 (LL) 32 - 45 mmHg    Comment: CRITICAL RESULT CALLED TO, READ  BACK BY AND VERIFIED WITH: BURGESS, A. RN AT 03/17/2024 1310 BY JEREMY C    Bicarbonate 9.7 (L) 20.0 - 28.0 mmol/L   Acid-base deficit 23.7 (H) 0.0 - 2.0 mmol/L   O2 Saturation 13.1 %   Patient temperature 37.0     Comment: Performed at [facility], [address] [place], [place] [address]  Beta-hydroxybutyric acid     Status: None   Collection Time: 03/17/24 12:39 PM  Result Value Ref Range   Beta-Hydroxybutyric Acid 0.17 0.05 - 0.27 mmol/L    Comment: Performed at [facility], [address], [place], [place] [address]  Blood culture (routine x 2)     Status: None (Preliminary result)   Collection Time: 03/17/24 12:39 PM   Specimen: BLOOD  Result Value Ref Range   Specimen Description      BLOOD RIGHT ANTECUBITAL Performed at [facility], [address] [place], [place]  [address]    Special Requests      BOTTLES DRAWN AEROBIC AND ANAEROBIC Blood Culture results may not be optimal due to an inadequate volume of blood received in culture bottles Performed at [facility], [address] [place], [place] [address]    Culture      NO GROWTH 2 DAYS Performed at [facility], [address], [place], [place] [address]    Report Status PENDING   I-Stat Lactic Acid     Status: Abnormal   Collection Time: 03/17/24 12:48 PM  Result Value Ref Range   Lactic Acid, Venous >15.0 (HH) 0.5 - 1.9 mmol/L   Comment NOTIFIED PHYSICIAN   Blood culture (routine x 2)     Status: None (Preliminary result)   Collection Time: 03/17/24 12:57 PM   Specimen: BLOOD  Result Value Ref Range   Specimen Description      BLOOD LEFT ANTECUBITAL Performed at [facility], [address], [place], [place] [address]    Special Requests      BOTTLES DRAWN AEROBIC AND ANAEROBIC Blood Culture results may not be optimal due to an inadequate volume of blood received in culture bottles Performed at [facility], [address] [place], [place] [address]    Culture      NO GROWTH 2 DAYS Performed at [facility], [address] [place], [place] [address]    Report Status PENDING   Type and screen [facility]     Status: None   Collection Time: 03/17/24  1:03 PM  Result Value Ref Range   ABO/RH(D) O POS    Antibody Screen NEG    Sample Expiration 03/20/2024,2359    Unit Number [id number]    Blood Component Type RED CELLS,LR    Unit division 00    Status of Unit ISSUED,FINAL    Transfusion Status OK TO TRANSFUSE    Crossmatch Result Compatible    Unit Number [id number]    Blood Component Type RED CELLS,LR    Unit division 00    Status of Unit ISSUED,FINAL    Transfusion Status OK TO TRANSFUSE    Crossmatch Result      Compatible Performed at [facility], [address], [place], [place] [address]    Unit Number [id number]    Blood Component Type RED CELLS,LR    Unit division 00    Status of Unit ISSUED,FINAL    Transfusion Status OK TO TRANSFUSE    Crossmatch Result Compatible    Unit Number [id number]    Blood Component Type RED CELLS,LR    Unit division 00    Status of Unit ISSUED,FINAL    Transfusion Status OK TO TRANSFUSE    Crossmatch Result Compatible   ABO/Rh     Status: None   Collection Time: 03/17/24  1:10 PM  Result Value Ref Range   ABO/RH(D)      O POS Performed at [facility], [address], [place], [place] [address]   Blood gas, arterial     Status: Abnormal   Collection Time: 03/17/24  2:25 PM  Result Value Ref Range   FIO2 100 %   Delivery systems VENTILATOR    Mode PRESSURE REGULATED VOLUME CONTROL    MECHVT 400 mL   RATE 30 resp/min   PEEP 5 cm H20   pH, Arterial 7.16 (LL) 7.35 - 7.45    Comment: CRITICAL RESULT CALLED  TO, READ BACK BY AND VERIFIED WITH: MOORE,A. RN AT 1510 03/17/24 MULLINS,T    pCO2 arterial 34 32 - 48 mmHg   pO2, Arterial 407 (H) 83 - 108  mmHg   Bicarbonate 12.1 (L) 20.0 - 28.0 mmol/L   Acid-base deficit 15.6 (H) 0.0 - 2.0 mmol/L   O2 Saturation 99.6 %   Patient temperature 37.0    Collection site RIGHT RADIAL    Drawn by [id number]    Allens test (pass/fail) PASS PASS    Comment: Performed at [facility], [address] [place], [place] [address]  Prepare RBC (crossmatch)     Status: None   Collection Time: 03/17/24  2:42 PM  Result Value Ref Range   Order Confirmation      ORDER PROCESSED BY BLOOD BANK Performed at [facility], [address] [place], [place] [address]   Urine rapid drug screen (hosp performed)     Status: Abnormal   Collection Time: 03/17/24  3:00 PM  Result Value Ref Range   Opiates NONE DETECTED NONE DETECTED   Cocaine NONE DETECTED NONE DETECTED   Benzodiazepines NONE DETECTED NONE DETECTED   Amphetamines NONE DETECTED NONE DETECTED   Tetrahydrocannabinol POSITIVE (A) NONE DETECTED   Barbiturates NONE DETECTED NONE DETECTED    Comment: (NOTE) DRUG SCREEN FOR MEDICAL PURPOSES ONLY.  IF CONFIRMATION IS NEEDED FOR ANY PURPOSE, NOTIFY LAB WITHIN 5 DAYS.  LOWEST DETECTABLE LIMITS FOR URINE DRUG SCREEN Drug Class                     Cutoff (ng/mL) Amphetamine and metabolites    1000 Barbiturate and metabolites    200 Benzodiazepine                 200 Opiates and metabolites        300 Cocaine and metabolites        300 THC                            50 Performed at [facility], [address] [place], [place] [address]   Urinalysis, Routine w reflex microscopic -Urine, Clean Catch     Status: Abnormal   Collection Time: 03/17/24  3:00 PM  Result Value Ref Range   Color, Urine STRAW (A) YELLOW   APPearance HAZY (A) CLEAR   Specific Gravity, Urine 1.017 1.005 - 1.030   pH 7.0 5.0 - 8.0   Glucose, UA >=500 (A) NEGATIVE mg/dL   Hgb urine dipstick MODERATE (A) NEGATIVE   Bilirubin Urine NEGATIVE NEGATIVE   Ketones, ur NEGATIVE  NEGATIVE mg/dL   Protein, ur 899 (A) NEGATIVE mg/dL   Nitrite NEGATIVE NEGATIVE   Leukocytes,Ua NEGATIVE NEGATIVE   RBC / HPF 11-20 0 - 5 RBC/hpf   WBC, UA 0-5 0 - 5 WBC/hpf   Bacteria, UA RARE (A) NONE SEEN   Squamous Epithelial / HPF 0-5 0 - 5 /HPF   Mucus PRESENT     Comment: Performed at [facility], [address], [place], [place] [address]  Urine Culture     Status: None   Collection Time: 03/17/24  3:00 PM   Specimen: Urine, Catheterized  Result Value Ref Range   Specimen Description      URINE, CATHETERIZED Performed at [facility], [address] [place], [place] [address]    Special Requests  NONE Performed at [facility], [address], [place], [place] [address]    Culture      NO GROWTH Performed at [facility], [address], [place], [place] [address]    Report Status 03/18/2024 FINAL   HIV Antibody (routine testing w rflx)     Status: None   Collection Time: 03/17/24  3:15 PM  Result Value Ref Range   HIV Screen 4th Generation wRfx Non Reactive Non Reactive    Comment: Performed at [facility], [address], [place], [place] [address]  Troponin I (High Sensitivity)     Status: Abnormal   Collection Time: 03/17/24  3:15 PM  Result Value Ref Range   Troponin I (High Sensitivity) 1,197 (HH) <18 ng/L    Comment: DELTA CHECK NOTED CRITICAL RESULT CALLED TO, READ BACK BY AND VERIFIED WITH CREASEY,J. RN AT 1702 03/17/24 MULLINS,T (NOTE) Elevated high sensitivity troponin I (hsTnI) values and significant  changes across serial measurements may suggest ACS but many other  chronic and acute conditions are known to elevate hsTnI results.  Refer to the Links section for chest pain algorithms and additional  guidance. Performed at [facility], [address] [place], [place] [address]   Basic metabolic panel     Status: Abnormal   Collection Time: 03/17/24   3:15 PM  Result Value Ref Range   Sodium 138 135 - 145 mmol/L   Potassium 3.4 (L) 3.5 - 5.1 mmol/L   Chloride 105 98 - 111 mmol/L   CO2 14 (L) 22 - 32 mmol/L   Glucose, Bld 374 (H) 70 - 99 mg/dL    Comment: Glucose reference range applies only to samples taken after fasting for at least 8 hours.   BUN 9 6 - 20 mg/dL   Creatinine, Ser 8.45 (H) 0.44 - 1.00 mg/dL   Calcium  7.0 (L) 8.9 - 10.3 mg/dL   GFR, Estimated 46 (L) >60 mL/min    Comment: (NOTE) Calculated using the CKD-EPI Creatinine Equation (2021)    Anion gap 19 (H) 5 - 15    Comment: Performed at [facility], [address], [place], [place] [address]  Beta-hydroxybutyric acid     Status: None   Collection Time: 03/17/24  3:15 PM  Result Value Ref Range   Beta-Hydroxybutyric Acid 0.17 0.05 - 0.27 mmol/L    Comment: Performed at [facility], [address], [place], [place] [address]  Fibrinogen      Status: Abnormal   Collection Time: 03/17/24  3:15 PM  Result Value Ref Range   Fibrinogen  106 (L) 210 - 475 mg/dL    Comment: (NOTE) Fibrinogen  results may be underestimated in patients receiving thrombolytic therapy. Performed at [facility], [address], [place], [place] [address]   Protime-INR     Status: Abnormal   Collection Time: 03/17/24  3:15 PM  Result Value Ref Range   Prothrombin Time 24.9 (H) 11.4 - 15.2 seconds   INR 2.1 (H) 0.8 - 1.2    Comment: (NOTE) INR goal varies based on device and disease states. Performed at [facility], [address] [place], [place] [address]   APTT     Status: Abnormal   Collection Time: 03/17/24  3:15 PM  Result Value Ref Range   aPTT 60 (H) 24 - 36 seconds    Comment:        IF BASELINE aPTT IS ELEVATED, SUGGEST PATIENT  RISK ASSESSMENT BE USED TO DETERMINE APPROPRIATE ANTICOAGULANT THERAPY. Performed at [facility], [address], [place], [place] [address]   I-Stat Lactic Acid      Status: Abnormal   Collection Time: 03/17/24  3:24 PM  Result Value Ref Range   Lactic Acid, Venous 14.8 (HH) 0.5 - 1.9 mmol/L   Comment NOTIFIED PHYSICIAN   Prepare RBC (crossmatch) INTRAOP ONLY     Status: None   Collection Time: 03/17/24  3:31 PM  Result Value Ref Range   Order Confirmation      ORDER PROCESSED BY BLOOD BANK Performed at [facility], [address] [place], [place] [address]   I-STAT 7, (LYTES, BLD GAS, ICA, H+H)     Status: Abnormal   Collection Time: 03/17/24  4:17 PM  Result Value Ref Range   pH, Arterial 7.110 (LL) 7.35 - 7.45   pCO2 arterial 38.7 32 - 48 mmHg   pO2, Arterial 378 (H) 83 - 108 mmHg   Bicarbonate 12.3 (L) 20.0 - 28.0 mmol/L   TCO2 13 (L) 22 - 32 mmol/L   O2 Saturation 100 %   Acid-base deficit 16.0 (H) 0.0 - 2.0 mmol/L   Sodium 140 135 - 145 mmol/L   Potassium 4.1 3.5 - 5.1 mmol/L   Calcium , Ion 0.88 (LL) 1.15 - 1.40 mmol/L   HCT 18.0 (L) 36.0 - 46.0 %   Hemoglobin 6.1 (LL) 12.0 - 15.0 g/dL   Sample type ARTERIAL    Comment NOTIFIED PHYSICIAN   Prepare fresh frozen plasma     Status: None (Preliminary result)   Collection Time: 03/17/24  4:41 PM  Result Value Ref Range   Unit Number [id number]    Blood Component Type THW PLS APHR    Unit division A0    Status of Unit ISSUED,FINAL    Transfusion Status      OK TO TRANSFUSE Performed at [facility], [address] [place], [place] [address]    Unit Number [id number]    Blood Component Type THW PLS APHR    Unit division B0    Status of Unit ISSUED    Transfusion Status OK TO TRANSFUSE   I-STAT 7, (LYTES, BLD GAS, ICA, H+H)     Status: Abnormal   Collection Time: 03/17/24  5:12 PM  Result Value Ref Range   pH, Arterial 7.160 (LL) 7.35 - 7.45   pCO2 arterial 41.5 32 - 48 mmHg   pO2, Arterial 294 (H) 83 - 108 mmHg   Bicarbonate 14.8 (L) 20.0 - 28.0 mmol/L   TCO2 16 (L) 22 - 32 mmol/L   O2 Saturation 100 %   Acid-base deficit 13.0  (H) 0.0 - 2.0 mmol/L   Sodium 143 135 - 145 mmol/L   Potassium 3.6 3.5 - 5.1 mmol/L   Calcium , Ion 1.14 (L) 1.15 - 1.40 mmol/L   HCT 32.0 (L) 36.0 - 46.0 %   Hemoglobin 10.9 (L) 12.0 - 15.0 g/dL   Sample type ARTERIAL   Beta-hydroxybutyric acid     Status: None   Collection Time: 03/17/24  5:50 PM  Result Value Ref Range   Beta-Hydroxybutyric Acid 0.10 0.05 - 0.27 mmol/L    Comment: Performed at [facility] [facility], [address], [place], [place] [address]  CBC     Status: Abnormal   Collection Time: 03/17/24  5:50 PM  Result Value Ref Range   WBC 31.9 (H) 4.0 - 10.5 K/uL   RBC 4.91  3.87 - 5.11 MIL/uL   Hemoglobin 14.6 12.0 - 15.0 g/dL    Comment: REPEATED TO VERIFY POST TRANSFUSION SPECIMEN    HCT 43.2 36.0 - 46.0 %   MCV 88.0 80.0 - 100.0 fL   MCH 29.7 26.0 - 34.0 pg   MCHC 33.8 30.0 - 36.0 g/dL   RDW 82.3 (H) 88.4 - 84.4 %   Platelets 87 (L) 150 - 400 K/uL    Comment: Immature Platelet Fraction may be clinically indicated, consider ordering this additional test [id number]    nRBC 0.2 0.0 - 0.2 %    Comment: Performed at [facility], [address], [place], [place] [address]  Basic metabolic panel     Status: Abnormal   Collection Time: 03/17/24  5:50 PM  Result Value Ref Range   Sodium 142 135 - 145 mmol/L   Potassium 3.8 3.5 - 5.1 mmol/L   Chloride 111 98 - 111 mmol/L   CO2 17 (L) 22 - 32 mmol/L   Glucose, Bld 180 (H) 70 - 99 mg/dL    Comment: Glucose reference range applies only to samples taken after fasting for at least 8 hours.   BUN 9 6 - 20 mg/dL   Creatinine, Ser 9.10 0.44 - 1.00 mg/dL   Calcium  7.7 (L) 8.9 - 10.3 mg/dL   GFR, Estimated >39 >39 mL/min    Comment: (NOTE) Calculated using the CKD-EPI Creatinine Equation (2021)    Anion gap 14 5 - 15    Comment: Performed at [facility], [address], [place], [place] [address]  Lactic acid, plasma     Status: Abnormal   Collection Time: 03/17/24  5:50 PM  Result  Value Ref Range   Lactic Acid, Venous 6.5 (HH) 0.5 - 1.9 mmol/L    Comment: CRITICAL RESULT CALLED TO, READ BACK BY AND VERIFIED WITH EMERSON NORSE, RN 03/17/24 1844 J. COLE Performed at [facility], [address], [place], [place] [address]   Magnesium      Status: Abnormal   Collection Time: 03/17/24  5:50 PM  Result Value Ref Range   Magnesium  1.5 (L) 1.7 - 2.4 mg/dL    Comment: Performed at [facility], [address], [place], [place] [address]  Phosphorus     Status: None   Collection Time: 03/17/24  5:50 PM  Result Value Ref Range   Phosphorus 4.6 2.5 - 4.6 mg/dL    Comment: Performed at [facility], [address], [place], [place] [address]  Blood gas, arterial     Status: Abnormal   Collection Time: 03/17/24  5:55 PM  Result Value Ref Range   FIO2 50 %   Delivery systems VENTILATOR    Mode PRESSURE REGULATED VOLUME CONTROL    MECHVT 400 mL   RATE 30 resp/min   PEEP 5 cm H20   pH, Arterial 7.42 7.35 - 7.45   pCO2 arterial 28 (L) 32 - 48 mmHg   pO2, Arterial 185 (H) 83 - 108 mmHg   Bicarbonate 18.5 (L) 20.0 - 28.0 mmol/L   Acid-base deficit 5.7 (H) 0.0 - 2.0 mmol/L   O2 Saturation 100 %   Patient temperature 33.8    Collection site A-LINE DRAW    Drawn by [id number]     Comment: Performed at [facility], [address], [place], [place] [address]  Glucose, capillary     Status: Abnormal   Collection Time: 03/17/24  5:57 PM  Result Value Ref Range   Glucose-Capillary 215 (H) 70 - 99 mg/dL    Comment: Glucose reference range applies only to samples taken after fasting for at least 8 hours.  Glucose, capillary     Status: Abnormal   Collection Time: 03/17/24  8:07 PM  Result Value Ref Range   Glucose-Capillary 107 (H) 70 - 99 mg/dL    Comment: Glucose reference range applies only to samples taken after fasting for at least 8 hours.  MRSA Next Gen by PCR, Nasal     Status: None   Collection Time:  03/17/24  8:13 PM   Specimen: Nasal Mucosa; Nasal Swab  Result Value Ref Range   MRSA by PCR Next Gen NOT DETECTED NOT DETECTED    Comment: (NOTE) The GeneXpert MRSA Assay (FDA approved for NASAL specimens only), is one component of a comprehensive MRSA colonization surveillance program. It is not intended to diagnose MRSA infection nor to guide or monitor treatment for MRSA infections. Test performance is not FDA approved in patients less than 77 years old. Performed at [facility], [address] [place], [place] [address]   Glucose, capillary     Status: Abnormal   Collection Time: 03/17/24  9:41 PM  Result Value Ref Range   Glucose-Capillary 103 (H) 70 - 99 mg/dL    Comment: Glucose reference range applies only to samples taken after fasting for at least 8 hours.  Glucose, capillary     Status: Abnormal   Collection Time: 03/17/24 11:58 PM  Result Value Ref Range   Glucose-Capillary 123 (H) 70 - 99 mg/dL    Comment: Glucose reference range applies only to samples taken after fasting for at least 8 hours.  Glucose, capillary     Status: None   Collection Time: 03/18/24  4:02 AM  Result Value Ref Range   Glucose-Capillary 93 70 - 99 mg/dL    Comment: Glucose reference range applies only to samples taken after fasting for at least 8 hours.  Blood gas, arterial     Status: Abnormal   Collection Time: 03/18/24  4:29 AM  Result Value Ref Range   FIO2 40 %   Delivery systems VENTILATOR    Mode PRESSURE REGULATED VOLUME CONTROL    MECHVT 400 mL   RATE 26 resp/min   PEEP 5 cm H20   pH, Arterial 7.47 (H) 7.35 - 7.45   pCO2 arterial 28 (L) 32 - 48 mmHg   pO2, Arterial 184 (H) 83 - 108 mmHg   Bicarbonate 20.4 20.0 - 28.0 mmol/L   Acid-base deficit 2.0 0.0 - 2.0 mmol/L   O2 Saturation 100 %   Patient temperature 37.1    Collection site A-LINE    Drawn by [id number]     Comment: Performed at [facility], [address], [place], [place]  [address]  CBC     Status: Abnormal   Collection Time: 03/18/24  6:01 AM  Result Value Ref Range   WBC 16.6 (H) 4.0 - 10.5 K/uL   RBC 3.98 3.87 - 5.11 MIL/uL   Hemoglobin 11.7 (L) 12.0 - 15.0 g/dL   HCT 65.8 (L) 63.9 - 53.9 %   MCV 85.7 80.0 - 100.0 fL   MCH 29.4 26.0 - 34.0 pg   MCHC 34.3 30.0 - 36.0 g/dL   RDW 82.3 (H) 88.4 - 84.4 %   Platelets 87 (L) 150 - 400 K/uL    Comment: SPECIMEN CHECKED FOR CLOTS CONSISTENT WITH PREVIOUS RESULT  REPEATED TO VERIFY PLATELET COUNT CONFIRMED BY SMEAR    nRBC 0.2 0.0 - 0.2 %    Comment: Performed at [facility], [address] [place], [place] [address]  Magnesium      Status: Abnormal   Collection Time: 03/18/24  6:01 AM  Result Value Ref Range   Magnesium  2.5 (H) 1.7 - 2.4 mg/dL    Comment: Performed at [facility], [address] [place], [place] [address]  Phosphorus     Status: None   Collection Time: 03/18/24  6:01 AM  Result Value Ref Range   Phosphorus 3.0 2.5 - 4.6 mg/dL    Comment: Performed at [facility], [address], [place], [place] [address]  Triglycerides     Status: Abnormal   Collection Time: 03/18/24  6:01 AM  Result Value Ref Range   Triglycerides 275 (H) <150 mg/dL    Comment: Performed at [facility], [address] [place], [place] [address]  Lactic acid, plasma     Status: None   Collection Time: 03/18/24  6:01 AM  Result Value Ref Range   Lactic Acid, Venous 1.1 0.5 - 1.9 mmol/L    Comment: Performed at [facility], [address] [place], [place] [address]  Basic metabolic panel     Status: Abnormal   Collection Time: 03/18/24  6:01 AM  Result Value Ref Range   Sodium 139 135 - 145 mmol/L   Potassium 3.8 3.5 - 5.1 mmol/L   Chloride 113 (H) 98 - 111 mmol/L   CO2 19 (L) 22 - 32 mmol/L   Glucose, Bld 104 (H) 70 - 99 mg/dL    Comment: Glucose reference range applies only to samples taken after fasting for at least 8  hours.   BUN 9 6 - 20 mg/dL   Creatinine, Ser 9.07 0.44 - 1.00 mg/dL   Calcium  7.5 (L) 8.9 - 10.3 mg/dL   GFR, Estimated >39 >39 mL/min    Comment: (NOTE) Calculated using the CKD-EPI Creatinine Equation (2021)    Anion gap 7 5 - 15    Comment: Performed at [facility], [address], [place], [place] [address]  Glucose, capillary     Status: None   Collection Time: 03/18/24  7:51 AM  Result Value Ref Range   Glucose-Capillary 89 70 - 99 mg/dL    Comment: Glucose reference range applies only to samples taken after fasting for at least 8 hours.   Comment 1 Notify RN    Comment 2 Document in Chart   Glucose, capillary     Status: None   Collection Time: 03/18/24  1:23 PM  Result Value Ref Range   Glucose-Capillary 95 70 - 99 mg/dL    Comment: Glucose reference range applies only to samples taken after fasting for at least 8 hours.   Comment 1 Notify RN    Comment 2 Document in Chart   CBC     Status: Abnormal   Collection Time: 03/18/24  3:41 PM  Result Value Ref Range   WBC 16.8 (H) 4.0 - 10.5 K/uL   RBC 3.62 (L) 3.87 - 5.11 MIL/uL   Hemoglobin 10.6 (L) 12.0 - 15.0 g/dL   HCT 68.5 (L) 63.9 - 53.9 %   MCV 86.7 80.0 - 100.0 fL   MCH 29.3 26.0 - 34.0 pg   MCHC 33.8 30.0 - 36.0 g/dL   RDW 82.6 (H) 88.4 - 84.4 %  Platelets 78 (L) 150 - 400 K/uL    Comment: Immature Platelet Fraction may be clinically indicated, consider ordering this additional test [id number] CONSISTENT WITH PREVIOUS RESULT REPEATED TO VERIFY    nRBC 0.1 0.0 - 0.2 %    Comment: Performed at [facility], [address], [place], [place] [address]  Glucose, capillary     Status: Abnormal   Collection Time: 03/18/24  4:46 PM  Result Value Ref Range   Glucose-Capillary 106 (H) 70 - 99 mg/dL    Comment: Glucose reference range applies only to samples taken after fasting for at least 8 hours.   Comment 1 Notify RN    Comment 2 Document in Chart   Glucose, capillary      Status: None   Collection Time: 03/18/24  8:04 PM  Result Value Ref Range   Glucose-Capillary 90 70 - 99 mg/dL    Comment: Glucose reference range applies only to samples taken after fasting for at least 8 hours.  Glucose, capillary     Status: None   Collection Time: 03/18/24 11:49 PM  Result Value Ref Range   Glucose-Capillary 97 70 - 99 mg/dL    Comment: Glucose reference range applies only to samples taken after fasting for at least 8 hours.  Glucose, capillary     Status: None   Collection Time: 03/19/24  3:29 AM  Result Value Ref Range   Glucose-Capillary 80 70 - 99 mg/dL    Comment: Glucose reference range applies only to samples taken after fasting for at least 8 hours.  CBC     Status: Abnormal   Collection Time: 03/19/24  6:08 AM  Result Value Ref Range   WBC 17.1 (H) 4.0 - 10.5 K/uL   RBC 3.58 (L) 3.87 - 5.11 MIL/uL   Hemoglobin 10.5 (L) 12.0 - 15.0 g/dL   HCT 68.6 (L) 63.9 - 53.9 %   MCV 87.4 80.0 - 100.0 fL   MCH 29.3 26.0 - 34.0 pg   MCHC 33.5 30.0 - 36.0 g/dL   RDW 83.0 (H) 88.4 - 84.4 %   Platelets 76 (L) 150 - 400 K/uL    Comment: Immature Platelet Fraction may be clinically indicated, consider ordering this additional test [id number]    nRBC 0.0 0.0 - 0.2 %    Comment: Performed at [facility], [address] [place], [place] [address]  Basic metabolic panel     Status: Abnormal   Collection Time: 03/19/24  6:08 AM  Result Value Ref Range   Sodium 135 135 - 145 mmol/L   Potassium 3.1 (L) 3.5 - 5.1 mmol/L   Chloride 102 98 - 111 mmol/L   CO2 21 (L) 22 - 32 mmol/L   Glucose, Bld 87 70 - 99 mg/dL    Comment: Glucose reference range applies only to samples taken after fasting for at least 8 hours.   BUN <5 (L) 6 - 20 mg/dL   Creatinine, Ser 9.41 0.44 - 1.00 mg/dL   Calcium  7.9 (L) 8.9 - 10.3 mg/dL   GFR, Estimated >39 >39 mL/min    Comment: (NOTE) Calculated using the CKD-EPI Creatinine Equation (2021)    Anion gap 12 5 - 15     Comment: Performed at [facility], [address] [place], [place] [address]  Magnesium      Status: None   Collection Time: 03/19/24  6:08 AM  Result Value Ref Range   Magnesium  1.7 1.7 - 2.4 mg/dL  Comment: Performed at [facility], [address], [place], [place] [address]  Glucose, capillary     Status: None   Collection Time: 03/19/24  8:05 AM  Result Value Ref Range   Glucose-Capillary 75 70 - 99 mg/dL    Comment: Glucose reference range applies only to samples taken after fasting for at least 8 hours.   Comment 1 Notify RN    Comment 2 Document in Chart     Imaging / Studies: DG Abd 1 View Result Date: 03/17/2024 CLINICAL DATA:  Nasogastric tube placement. EXAM: ABDOMEN - 1 VIEW COMPARISON:  CT earlier today FINDINGS: Tip and side port of the enteric tube below the diaphragm in the stomach. Midline skin staples. IMPRESSION: Tip and side port of the enteric tube below the diaphragm in the stomach. Electronically Signed   By: Andrea Gasman M.D.   On: 03/17/2024 19:59   CT CHEST ABDOMEN PELVIS W CONTRAST Result Date: 03/17/2024 CLINICAL DATA:  Sepsis * Tracking Code: BO * EXAM: CT CHEST, ABDOMEN, AND PELVIS WITH CONTRAST TECHNIQUE: Multidetector CT imaging of the chest, abdomen and pelvis was performed following the standard protocol during bolus administration of intravenous contrast. RADIATION DOSE REDUCTION: This exam was performed according to the departmental dose-optimization program which includes automated exposure control, adjustment of the mA and/or kV according to patient size and/or use of iterative reconstruction technique. CONTRAST:  OMNIPAQUE  IOHEXOL  300 MG/ML  SOLN COMPARISON:  08/18/2020 FINDINGS: CT CHEST FINDINGS Cardiovascular: No significant vascular findings. Normal heart size. No pericardial effusion. Mediastinum/Nodes: No enlarged mediastinal, hilar, or axillary lymph nodes. Thymic remnant in the anterior mediastinum.  Esophagus is distended and fluid-filled with esophagogastric tube, tip and side port below the diaphragm. Lungs/Pleura: Endotracheal intubation, tube tip above the carina. Irregular bibasilar atelectasis or consolidation. No pleural effusion or pneumothorax. Musculoskeletal: No chest wall abnormality. No acute osseous findings. CT ABDOMEN PELVIS FINDINGS Hepatobiliary: No solid liver abnormality is seen. No gallstones, gallbladder wall thickening, or biliary dilatation. Pancreas: Unremarkable. No pancreatic ductal dilatation or surrounding inflammatory changes. Spleen: Normal in size without significant abnormality. Adrenals/Urinary Tract: Adrenal glands are unremarkable. Kidneys are normal, without renal calculi, solid lesion, or hydronephrosis. Bladder is unremarkable. Stomach/Bowel: Stomach is within normal limits. Appendix appears normal. No evidence of bowel wall thickening, distention, or inflammatory changes. Vascular/Lymphatic: Flattened IVC, concerning for hypovolemia. No enlarged abdominal or pelvic lymph nodes. Reproductive: Extremely bulky fibroid uterus, significantly increased in size compared to prior examination, overall dimensions at least 21.5 x 11.5 x 13.8 cm (series 11, image 95, series 3, image 84). Other: No abdominal wall hernia or abnormality. Large volume intermediate attenuation fluid and heterogeneous, layering blood product throughout the abdomen and pelvis. Peritoneal and omental thickening throughout the ventral abdomen (series 3, image 69). Musculoskeletal: No acute osseous findings. IMPRESSION: 1. Large volume intermediate attenuation fluid and heterogeneous, layering blood product throughout the abdomen and pelvis, consistent with hemoperitoneum although of uncertain source. Trauma is generally by far the most common etiology of abdominal hemorrhage particularly in this demographic however reportedly there is no history of trauma. Spontaneous hemorrhage from uterine fibroids is a  rare, although reported complication. 2. Extremely bulky fibroid uterus, significantly increased in size compared to prior examination, overall dimensions at least 21.5 x 11.5 x 13.8 cm and particularly marked enlargement of a fibroid of the uterine fundus. This may reflect a benign fibroid although leiomyosarcoma is not excluded especially given other constellation of findings. 3. Peritoneal and omental thickening throughout the ventral abdomen,  of uncertain significance although suspicious for peritoneal malignancy. 4. Irregular bibasilar atelectasis or consolidation, most suggestive of aspiration and/or chronic sequelae thereof given the presence of fluid within the esophagus. 5. Endotracheal and esophagogastric intubation. Findings reported by telephone to Tylene Paris, [place], 2:37 p.m. 03/17/2024 Electronically Signed   By: Marolyn JONETTA Jaksch M.D.   On: 03/17/2024 14:42   CT Head Wo Contrast Result Date: 03/17/2024 CLINICAL DATA:  Provided history: Headache, neuro deficit. Mental status change, unknown cause. EXAM: CT HEAD WITHOUT CONTRAST TECHNIQUE: Contiguous axial images were obtained from the base of the skull through the vertex without intravenous contrast. RADIATION DOSE REDUCTION: This exam was performed according to the departmental dose-optimization program which includes automated exposure control, adjustment of the mA and/or kV according to patient size and/or use of iterative reconstruction technique. COMPARISON:  Brain MRI 12/27/2010. FINDINGS: Moderately motion degraded examination. Within this limitation, findings are as follows. Brain: Cerebral volume is normal. The small pituitary lesion demonstrated on the prior brain MRI of 12/27/2010 cannot be reassessed on this non-contrast head CT. No evidence of an intracranial mass elsewhere. There is no acute intracranial hemorrhage. No demarcated cortical infarct. No extra-axial fluid collection. No midline shift. Vascular: No hyperdense vessel. Skull: No  calvarial fracture or aggressive osseous lesion. Sinuses/Orbits: No mass or acute finding within the imaged orbits. No significant paranasal sinus disease at the imaged levels. IMPRESSION: 1. Motion degraded examination. 2. Within this limitation, no acute intracranial abnormality is identified. 3. Please note, the small pituitary lesion demonstrated on the prior brain MRI of 12/27/2010 cannot be reassessed on this non-contrast head CT. This would be best reassessed with a pituitary protocol brain MRI (with and without contrast). Electronically Signed   By: Rockey Childs D.O.   On: 03/17/2024 14:35   DG Chest Portable 1 View Result Date: 03/17/2024 CLINICAL DATA:  POST INTUBATION EXAM: PORTABLE CHEST - 1 VIEW COMPARISON:  August 20, 2020 FINDINGS: Well-positioned endotracheal tube terminating in the mid trachea. Esophagogastric tube is well positioned terminating in the stomach. Lower lung volumes. No focal airspace consolidation, pleural effusion, or pneumothorax. No cardiomegaly. IMPRESSION: Well-positioned endotracheal and esophagogastric tubes. No pneumothorax. Electronically Signed   By: Rogelia Myers M.D.   On: 03/17/2024 14:32    Medications / Allergies: per chart  Antibiotics: Anti-infectives (From admission, onward)    Start     Dose/Rate Route Frequency Ordered Stop   03/18/24 0000  vancomycin  (VANCOCIN ) IVPB 1000 mg/200 mL premix  Status:  Discontinued        1,000 mg 200 mL/hr over 60 Minutes Intravenous Every 24 hours 03/17/24 1649 03/18/24 1055   03/17/24 2200  ceFEPIme  (MAXIPIME ) 2 g in sodium chloride  0.9 % 100 mL IVPB  Status:  Discontinued        2 g 200 mL/hr over 30 Minutes Intravenous 2 times daily 03/17/24 1621 03/18/24 1055   03/17/24 2200  metroNIDAZOLE  (FLAGYL ) IVPB 500 mg  Status:  Discontinued        500 mg 100 mL/hr over 60 Minutes Intravenous 2 times daily 03/17/24 1621 03/18/24 1055   03/17/24 1315  vancomycin  (VANCOCIN ) IVPB 1000 mg/200 mL premix        1,000  mg 200 mL/hr over 60 Minutes Intravenous  Once 03/17/24 1314 03/17/24 1429   03/17/24 1315  piperacillin -tazobactam (ZOSYN ) IVPB 3.375 g        3.375 g 100 mL/hr over 30 Minutes Intravenous  Once 03/17/24 1314 03/17/24 1400  Note: Portions of this report may have been transcribed using voice recognition software. Every effort was made to ensure accuracy; however, inadvertent computerized transcription errors may be present.   Any transcriptional errors that result from this process are unintentional.    Elspeth KYM Schultze, MD, FACS, MASCRS Esophageal, Gastrointestinal & Colorectal Surgery Robotic and Minimally Invasive Surgery  [facility] [address], [address] [place], [place] [address] [phone] Fax [phone] Main  CONTACT INFORMATION: Weekday (9AM-5PM): Call [facility] main office at [phone] Weeknight (5PM-9AM) or Weekend/Holiday: Check EPIC Web Links tab & use AMION (password  [email or web address]) for General Surgery [facility] coverage  Please, DO NOT use SecureChat  (it is not reliable communication to reach operating surgeons & will lead to a delay in care).   Epic staff messaging available for outptient concerns needing 1-2 business day response.      03/19/2024  8:08 AM

## 2024-03-19 NOTE — Evaluation (Signed)
 Physical Therapy Evaluation Patient Details Name: [name] MRN: [id number] DOB: May 18, 1991 Today's Date: 03/19/2024  History of Present Illness  33 yo female presents to therapy following hospitalization on 03/17/2024 due to severe lower abdominal pain and agitation. Pt was found to have hemorrhagic shock attributed to ruptured fibroid and is now s/p myomectomy and exploratory lap on 7/2. Pt hospitalization completed due to abn labs, agitation and sock requiring intubation, pt extubated on 7/3 and postop ileus with NG tube placement. Pt required multiple blood transfusions. Pt PMH includes but is not limited to: Hashimoto's thyroiditis, schizoaffective disorder and uterine fibroids.  Clinical Impression     Pt admitted with above diagnosis.  Pt currently with functional limitations due to the deficits listed below (see PT Problem List). Pt in bed when PT arrived. Spouse present. Pt reported ongoing abdominal pain and fatigue due to limited sleep overnight. Pt willing to try and participate with PT. Pt required total A for bed mobility, once seated EOB pt indicated abdominal pain was too severe and assisted to semi reclined in bed. Pt left in bed, all needs in place and souse present. Pt will benefit from acute skilled PT to increase their independence and safety with mobility to allow discharge.          If plan is discharge home, recommend the following: Two people to help with walking and/or transfers;Two people to help with bathing/dressing/bathroom;Assist for transportation;Help with stairs or ramp for entrance   Can travel by private vehicle        Equipment Recommendations Rolling walker (2 wheels) (TBD as pt progresses)  Recommendations for Other Services  OT consult    Functional Status Assessment Patient has had a recent decline in their functional status and demonstrates the ability to make significant improvements in function in a reasonable and predictable amount of time.      Precautions / Restrictions Precautions Precautions: Fall (abdominal) Restrictions Weight Bearing Restrictions Per Provider Order: No      Mobility  Bed Mobility Overal bed mobility: Needs Assistance Bed Mobility: Supine to Sit, Sit to Supine     Supine to sit: Total assist, +2 for physical assistance, +2 for safety/equipment, HOB elevated Sit to supine: Total assist, +2 for physical assistance, +2 for safety/equipment, HOB elevated   General bed mobility comments: PT instructed in log roll technique for abdominal precuations, pt unable to tolerate sitting EOB at time of eval, pt required extensive assist for supine <> sit with A x 2 and HOB >/=30 degrees due to NG tube placement    Transfers                   General transfer comment: NT unable to progress with transfers due to pain    Ambulation/Gait               General Gait Details: NT unable to progress with gait due to pain  Stairs            Wheelchair Mobility     Tilt Bed    Modified Rankin (Stroke Patients Only)       Balance Overall balance assessment: Mild deficits observed, not formally tested                                           Pertinent Vitals/Pain Pain Assessment Pain Assessment: 0-10 Pain Score: 10-Worst pain ever  Pain Location: abdomen Pain Descriptors / Indicators: Aching, Constant, Guarding, Sharp, Tender, Heaviness Pain Intervention(s): Limited activity within patient's tolerance, Monitored during session, Premedicated before session, Repositioned    Home Living Family/patient expects to be discharged to:: Private residence Living Arrangements: Spouse/significant other Available Help at Discharge: Family Type of Home: Apartment Home Access: Stairs to enter Entrance Stairs-Rails: Lawyer of Steps: flight   Home Layout: One level Home Equipment: None      Prior Function Prior Level of Function :  Independent/Modified Independent             Mobility Comments: IND no AD for all ADLs, self care tasks and IADLs       Extremity/Trunk Assessment        Lower Extremity Assessment Lower Extremity Assessment: Generalized weakness    Cervical / Trunk Assessment Cervical / Trunk Assessment: Normal  Communication   Communication Communication: No apparent difficulties    Cognition Arousal: Alert Behavior During Therapy: WFL for tasks assessed/performed   PT - Cognitive impairments: No apparent impairments                         Following commands: Intact       Cueing       General Comments      Exercises     Assessment/Plan    PT Assessment Patient needs continued PT services  PT Problem List Decreased strength;Decreased activity tolerance;Decreased balance;Decreased mobility;Pain       PT Treatment Interventions DME instruction;Gait training;Stair training;Functional mobility training;Therapeutic exercise;Therapeutic activities;Balance training;Neuromuscular re-education;Patient/family education    PT Goals (Current goals can be found in the Care Plan section)  Acute Rehab PT Goals Patient Stated Goal: go home PT Goal Formulation: With patient Time For Goal Achievement: 04/02/24 Potential to Achieve Goals: Good    Frequency Min 3X/week     Co-evaluation               AM-PAC PT 6 Clicks Mobility  Outcome Measure Help needed turning from your back to your side while in a flat bed without using bedrails?: Total Help needed moving from lying on your back to sitting on the side of a flat bed without using bedrails?: Total Help needed moving to and from a bed to a chair (including a wheelchair)?: Total Help needed standing up from a chair using your arms (e.g., wheelchair or bedside chair)?: Total Help needed to walk in hospital room?: Total Help needed climbing 3-5 steps with a railing? : Total 6 Click Score: 6    End of  Session   Activity Tolerance: Patient limited by pain;Patient limited by fatigue Patient left: in bed;with call bell/phone within reach;with family/visitor present Nurse Communication: Mobility status PT Visit Diagnosis: Unsteadiness on feet (R26.81);Muscle weakness (generalized) (M62.81);Difficulty in walking, not elsewhere classified (R26.2);Pain Pain - part of body:  (abdomen)    Time: 1001-1020 PT Time Calculation (min) (ACUTE ONLY): 19 min   Charges:   PT Evaluation $PT Eval Moderate Complexity: 1 Mod   PT General Charges $$ ACUTE PT VISIT: 1 Visit         [place], PT Acute Rehab   [place] VEAR Drone 03/19/2024, 3:23 PM

## 2024-03-19 NOTE — Assessment & Plan Note (Signed)
 Resolved  Continue to monitor renal function.

## 2024-03-19 NOTE — Assessment & Plan Note (Signed)
 Patient with significant hyperglycemia on admission and was placed on SSI.  No prior diagnosis of diabetes.  A1c ordered and still pending. CBG now less than 100. - Continue to monitor

## 2024-03-19 NOTE — Assessment & Plan Note (Signed)
 S/p exploratory laparotomy and myomectomy.  Pain remained uncontrolled on morphine . - General Surgery switch to Dilaudid  PCA -Continue with PCA-wean as tolerated

## 2024-03-19 NOTE — Progress Notes (Signed)
 Progress Note   Patient: [name] FMW:[id number] DOB: [birth year] DOA: 03/17/2024     2 DOS: the patient was seen and examined on 03/19/2024   Brief hospital course: From prior notes.  [name] is a 33 y.o. F with PMH significant for Hashimoto's thyroiditis off medication for several years, schizoaffective disorder and uterine fibroids who presented to the ED with severe lower abdominal pain and agitation.   She was later intubated in the ED to protect airway due to extensive agitation and for concern of hemorrhagic shock.  Labs on presentation was with lactic acid more than 15, leukocytosis at 19K, glucose 367.  UDS was positive for cannabinoid Found to have hemoperitoneum with bleeding fibroid s/p laparotomy and myomectomy on 7/2.  Received total of 6 units of PRBC and 2 units of fresh frozen plasma.  Lactic acidosis resolved and hemoglobin stabilized.  Patient was extubated on 7/3, remained stable now on room air so care was transitioned to TRH.  7/4: Vital stable.  Labs with potassium of 3.1 and magnesium  of 1.7-replating electrolytes.  Worsening leukocytosis 17.1, hemoglobin stable at 10.5 and platelets of 76. Patient was febrile at 100.2 yesterday with worsening leukocytosis so starting on Zosyn . Prior urine and blood cultures were negative. Pain uncontrolled on morphine  so she was switched to Dilaudid  PCA by general surgery.  Still no BM or passing gas, significant discharge from NG tube.  Assessment and Plan: * Hemorrhagic shock from ruptured fibroid s/p myomectomy 03/17/2024 Initially required intubation.  Received multiple blood transfusions and fresh frozen plasma.  S/p exploratory laparotomy and myomectomy.  Hemoglobin now stable. Extubated on 7/3 and now on room air. No bowel function yet and uncontrolled pain. Worsening leukocytosis and becoming febrile. - Starting on Zosyn  - Continue with supportive care and IV fluid    Abdominal pain S/p exploratory laparotomy and  myomectomy.  Pain remained uncontrolled on morphine . - General Surgery switch to Dilaudid  PCA -Continue with PCA-wean as tolerated  Encephalopathy acute Mentation improved  Acidosis Patient with significant lactic acidosis secondary to hemoperitoneum. Has been resolved  AKI (acute kidney injury) (HCC) Resolved. - Continue to monitor renal function  Hyperglycemia Patient with significant hyperglycemia on admission and was placed on SSI.  No prior diagnosis of diabetes.  A1c ordered and still pending. CBG now less than 100. - Continue to monitor  Hypothyroidism due to Hashimoto's thyroiditis Patient with prior history of Hashimoto's thyroiditis and not on any supplement anymore. TSH elevated. - Outpatient follow-up and repeat TSH once improved from acute illness  Hypokalemia Potassium at 3.1 with hypomagnesemia -Replete potassium and monitor  Hypomagnesemia Magnesium  at 1.7 -Replete magnesium    Subjective: Patient continued to experience significant abdominal pain.  No flatus or bowel movement yet.  NG tube in place with significant secretions.  Physical Exam: Vitals:   03/19/24 0811 03/19/24 0949 03/19/24 1200 03/19/24 1300  BP:   134/77 (!) 142/96  Pulse:   100 (!) 106  Resp: 18 (!) 24 19 12   Temp:      TempSrc:      SpO2: 96% 96% 95% 96%  Weight:      Height:       General.  Well-developed lady, in no acute distress.  And G-tube in place Pulmonary.  Lungs clear bilaterally, normal respiratory effort. CV.  Regular rate and rhythm, no JVD, rub or murmur. Abdomen.  Soft, mild diffuse tenderness, bowel sounds hypoactive CNS.  Alert and oriented .  No focal neurologic deficit. Extremities.  No edema,  no cyanosis, pulses intact and symmetrical. Psychiatry.  Judgment and insight appears normal.   Data Reviewed: Prior data reviewed  Family Communication: Discussed with husband at bedside  Disposition: Status is: Inpatient Remains inpatient appropriate because:  Severity of illness  Planned Discharge Destination: Home  Time spent: 50 minutes  This record has been created using Conservation officer, historic buildings. Errors have been sought and corrected,but may not always be located. Such creation errors do not reflect on the standard of care.   Author: Amaryllis Dare, MD 03/19/2024 2:19 PM  For on call review [email or web address].

## 2024-03-19 NOTE — Assessment & Plan Note (Signed)
Potassium at 3.1 with hypomagnesemia. -Replete potassium and monitor

## 2024-03-19 NOTE — Assessment & Plan Note (Signed)
 Magnesium  at 1.7 -Replete magnesium 

## 2024-03-19 NOTE — Hospital Course (Addendum)
 From prior notes.  [name] is a 33 y.o. F with PMH significant for Hashimoto's thyroiditis off medication for several years, schizoaffective disorder and uterine fibroids who presented to the ED with severe lower abdominal pain and agitation.   She was later intubated in the ED to protect airway due to extensive agitation and for concern of hemorrhagic shock.  Labs on presentation was with lactic acid more than 15, leukocytosis at 19K, glucose 367.  UDS was positive for cannabinoid Found to have hemoperitoneum with bleeding fibroid s/p laparotomy and myomectomy on 7/2.  Received total of 6 units of PRBC and 2 units of fresh frozen plasma.  Lactic acidosis resolved and hemoglobin stabilized.  Patient was extubated on 7/3, remained stable now on room air so care was transitioned to TRH.  7/4: Vital stable.  Labs with potassium of 3.1 and magnesium  of 1.7-replating electrolytes.  Worsening leukocytosis 17.1, hemoglobin stable at 10.5 and platelets of 76. Patient was febrile at 100.2 yesterday with worsening leukocytosis so starting on Zosyn . Prior urine and blood cultures were negative. Pain uncontrolled on morphine  so she was switched to Dilaudid  PCA by general surgery.  Still no BM or passing gas, significant discharge from NG tube.  7/5: Vitals stable, continue to have significant pain.  Hypomagnesemia at 1.6 send mild hypophosphatemia-electrolytes are being repleted.  Slight worsening of metabolic acidosis, improving leukocytosis. Decreasing secretions from NG tube but no bowel function yet.  7/6: Pain little better today, remained on Dilaudid  PCA.  Still no bowel movement.  Foley catheter was replaced by urology yesterday after multiple failed attempts.  Likely developed ileus and urinary retention with significant pain medications postoperatively.  Urology to keep Foley for 5 to 10 days, she will need voiding trial which can be done by urology as outpatient.  Still no bowel function but  improving NG tube secretions.  7/7: Patient continued to improve, PCA pump was discontinued, had a bowel movement so starting on diet.  Foley to remain in for another week with outpatient urology follow-up.  If remains stable likely can be discharged home tomorrow.  7/8: Hemodynamically stable, hemoglobin at 11 today.  Patient with quite a bit of pain and cramping overnight and unable to sleep well.  Surgery would like to keep her for another day. Urology was also consulted as patient wants to remove Foley before discharge, they advised to remove today at midnight to give her a voiding trial, they will see her tomorrow morning.

## 2024-03-19 NOTE — Assessment & Plan Note (Signed)
 Initially required intubation.  Received multiple blood transfusions and fresh frozen plasma.  S/p exploratory laparotomy and myomectomy.  Hemoglobin now stable. Extubated on 7/3 and now on room air. No bowel function yet and uncontrolled pain. Worsening leukocytosis and becoming febrile. - Starting on Zosyn  - Continue with supportive care and IV fluid

## 2024-03-19 NOTE — Progress Notes (Signed)
 9 mL of morphine  was wasted by Elyn Saras, RN and Santana Sakai, RN into the steri cycle.

## 2024-03-19 NOTE — Assessment & Plan Note (Signed)
 Patient with prior history of Hashimoto's thyroiditis and not on any supplement anymore. TSH elevated. - Outpatient follow-up and repeat TSH once improved from acute illness

## 2024-03-19 NOTE — Assessment & Plan Note (Signed)
Mentation improved.

## 2024-03-19 NOTE — Assessment & Plan Note (Signed)
 Patient with significant lactic acidosis secondary to hemoperitoneum. Has been resolved

## 2024-03-20 ENCOUNTER — Encounter (HOSPITAL_COMMUNITY): Payer: Self-pay | Admitting: Pulmonary Disease

## 2024-03-20 DIAGNOSIS — F259 Schizoaffective disorder, unspecified: Secondary | ICD-10-CM | POA: Insufficient documentation

## 2024-03-20 DIAGNOSIS — N365 Urethral false passage: Secondary | ICD-10-CM

## 2024-03-20 DIAGNOSIS — D649 Anemia, unspecified: Secondary | ICD-10-CM | POA: Insufficient documentation

## 2024-03-20 DIAGNOSIS — R39198 Other difficulties with micturition: Secondary | ICD-10-CM

## 2024-03-20 DIAGNOSIS — E079 Disorder of thyroid, unspecified: Secondary | ICD-10-CM | POA: Insufficient documentation

## 2024-03-20 LAB — RENAL FUNCTION PANEL
Albumin: 3.5 g/dL (ref 3.5–5.0)
Anion gap: 14 (ref 5–15)
BUN: <5 mg/dL — ABNORMAL LOW (ref 6–20)
CO2: 17 mmol/L — ABNORMAL LOW (ref 22–32)
Calcium: 8.3 mg/dL — ABNORMAL LOW (ref 8.9–10.3)
Chloride: 104 mmol/L (ref 98–111)
Creatinine, Ser: 0.64 mg/dL (ref 0.44–1.00)
GFR, Estimated: >60 mL/min (ref >60)
Glucose, Bld: 87 mg/dL (ref 70–99)
Phosphorus: 2.3 mg/dL — ABNORMAL LOW (ref 2.5–4.6)
Potassium: 3.6 mmol/L (ref 3.5–5.1)
Sodium: 135 mmol/L (ref 135–145)

## 2024-03-20 LAB — BLOOD GAS, ARTERIAL
Acid-base deficit: 5.7 mmol/L — ABNORMAL HIGH (ref 0.0–2.0)
Acid-base deficit: 2 mmol/L (ref 0.0–2.0)
Bicarbonate: 18.5 mmol/L — ABNORMAL LOW (ref 20.0–28.0)
Bicarbonate: 20.4 mmol/L (ref 20.0–28.0)
Drawn by: 25770
Drawn by: 27027
FIO2: 50 %
FIO2: 40 %
MECHVT: 400 mL
MECHVT: 400 mL
O2 Saturation: 100 %
O2 Saturation: 100 %
PEEP: 5 cmH2O
PEEP: 5 cmH2O
Patient temperature: 33.8
Patient temperature: 37.1
RATE: 30 {breaths}/min
RATE: 26 {breaths}/min
pCO2 arterial: 28 mmHg — ABNORMAL LOW (ref 32–48)
pCO2 arterial: 28 mmHg — ABNORMAL LOW (ref 32–48)
pH, Arterial: 7.42 (ref 7.35–7.45)
pH, Arterial: 7.47 — ABNORMAL HIGH (ref 7.35–7.45)
pO2, Arterial: 185 mmHg — ABNORMAL HIGH (ref 83–108)
pO2, Arterial: 184 mmHg — ABNORMAL HIGH (ref 83–108)

## 2024-03-20 LAB — GLUCOSE, CAPILLARY
Glucose-Capillary: 74 mg/dL (ref 70–99)
Glucose-Capillary: 78 mg/dL (ref 70–99)
Glucose-Capillary: 84 mg/dL (ref 70–99)

## 2024-03-20 LAB — CBC
HCT: 34.6 % — ABNORMAL LOW (ref 36.0–46.0)
Hemoglobin: 11.5 g/dL — ABNORMAL LOW (ref 12.0–15.0)
MCH: 29 pg (ref 26.0–34.0)
MCHC: 33.2 g/dL (ref 30.0–36.0)
MCV: 87.2 fL (ref 80.0–100.0)
Platelets: 112 K/uL — ABNORMAL LOW (ref 150–400)
RBC: 3.97 MIL/uL (ref 3.87–5.11)
RDW: 16.1 % — ABNORMAL HIGH (ref 11.5–15.5)
WBC: 13.2 K/uL — ABNORMAL HIGH (ref 4.0–10.5)
nRBC: 0.2 % (ref 0.0–0.2)

## 2024-03-20 LAB — HEMOGLOBIN A1C
Hgb A1c MFr Bld: 4.8 % (ref 4.8–5.6)
Mean Plasma Glucose: 91 mg/dL

## 2024-03-20 LAB — MAGNESIUM: Magnesium: 1.6 mg/dL — ABNORMAL LOW (ref 1.7–2.4)

## 2024-03-20 MED ORDER — BISACODYL 10 MG RE SUPP
10.0000 mg | Freq: Every day | RECTAL | Status: DC
  Administered 2024-03-20 – 2024-03-22 (×3): 10 mg via RECTAL
  Filled 2024-03-20 (×3): qty 1

## 2024-03-20 MED ORDER — MAGNESIUM SULFATE 4 GM/100ML IV SOLN
4.0000 g | Freq: Once | INTRAVENOUS | Status: AC
  Administered 2024-03-20: 4 g via INTRAVENOUS
  Filled 2024-03-20: qty 100

## 2024-03-20 MED ORDER — LACTATED RINGERS IV BOLUS
1000.0000 mL | Freq: Three times a day (TID) | INTRAVENOUS | Status: AC | PRN
Start: 2024-03-20 — End: 2024-03-22

## 2024-03-20 MED ORDER — METOCLOPRAMIDE HCL 5 MG/ML IJ SOLN: 5.0000 mg | Freq: Three times a day (TID) | INTRAMUSCULAR | Status: DC | PRN

## 2024-03-20 MED ORDER — SALINE SPRAY 0.65 % NA SOLN: 1.0000 | Freq: Four times a day (QID) | NASAL | Status: DC | PRN

## 2024-03-20 MED ORDER — ALUM & MAG HYDROXIDE-SIMETH 200-200-20 MG/5ML PO SUSP: 30.0000 mL | Freq: Four times a day (QID) | ORAL | Status: DC | PRN

## 2024-03-20 MED ORDER — MAGIC MOUTHWASH: 15.0000 mL | Freq: Four times a day (QID) | ORAL | Status: DC | PRN

## 2024-03-20 MED ORDER — PHENOL 1.4 % MT LIQD: 2.0000 | OROMUCOSAL | Status: DC | PRN

## 2024-03-20 MED ORDER — LORAZEPAM 2 MG/ML IJ SOLN
0.5000 mg | Freq: Three times a day (TID) | INTRAMUSCULAR | Status: DC | PRN
  Administered 2024-03-20 – 2024-03-24 (×4): 1 mg via INTRAVENOUS
  Filled 2024-03-20 (×4): qty 1

## 2024-03-20 MED ORDER — SIMETHICONE 80 MG PO CHEW
80.0000 mg | CHEWABLE_TABLET | Freq: Four times a day (QID) | ORAL | Status: AC
  Administered 2024-03-21 – 2024-03-23 (×8): 80 mg via ORAL
  Filled 2024-03-20 (×10): qty 1

## 2024-03-20 MED ORDER — DEXTROSE IN LACTATED RINGERS 5 % IV SOLN: INTRAVENOUS | Status: AC

## 2024-03-20 MED ORDER — LACTATED RINGERS IV BOLUS
1000.0000 mL | Freq: Once | INTRAVENOUS | Status: AC
  Administered 2024-03-20: 1000 mL via INTRAVENOUS

## 2024-03-20 MED ORDER — METHOCARBAMOL 1000 MG/10ML IJ SOLN
1000.0000 mg | Freq: Four times a day (QID) | INTRAMUSCULAR | Status: AC
  Administered 2024-03-20 – 2024-03-23 (×12): 1000 mg via INTRAVENOUS
  Filled 2024-03-20 (×12): qty 10

## 2024-03-20 MED ORDER — NAPHAZOLINE-GLYCERIN 0.012-0.25 % OP SOLN: 1.0000 [drp] | Freq: Four times a day (QID) | OPHTHALMIC | Status: DC | PRN

## 2024-03-20 MED ORDER — ALBUTEROL SULFATE (2.5 MG/3ML) 0.083% IN NEBU: 2.5000 mg | INHALATION_SOLUTION | Freq: Four times a day (QID) | RESPIRATORY_TRACT | Status: DC | PRN

## 2024-03-20 MED ORDER — FUROSEMIDE 10 MG/ML IJ SOLN
40.0000 mg | Freq: Once | INTRAMUSCULAR | Status: AC
  Administered 2024-03-20: 40 mg via INTRAVENOUS
  Filled 2024-03-20: qty 4

## 2024-03-20 MED ORDER — BOOST / RESOURCE BREEZE PO LIQD CUSTOM: 1.0000 | Freq: Three times a day (TID) | ORAL | Status: DC

## 2024-03-20 MED ORDER — POTASSIUM PHOSPHATES 15 MMOLE/5ML IV SOLN
30.0000 mmol | Freq: Once | INTRAVENOUS | Status: AC
  Administered 2024-03-20: 30 mmol via INTRAVENOUS
  Filled 2024-03-20: qty 10

## 2024-03-20 MED ORDER — SODIUM CHLORIDE 0.9% FLUSH: 10.0000 mL | INTRAVENOUS | Status: DC | PRN

## 2024-03-20 MED ORDER — MENTHOL 3 MG MT LOZG: 1.0000 | LOZENGE | OROMUCOSAL | Status: DC | PRN

## 2024-03-20 NOTE — Progress Notes (Addendum)
 03/20/2024  [name] [id number] Jan 17, 1981  CARE TEAM: PCP: Gladis Elsie BROCKS, PA-C  Outpatient Care Team: Patient Care Team: Gladis Elsie BROCKS DEVONNA as PCP - General (Family Medicine) Debby Hila, MD as Consulting Physician (Colon and Rectal Surgery)  Inpatient Treatment Team: Treatment Team:  Caleen Qualia, MD Pccm, Md, MD Ccs, Md, MD Bobbette Civil, MD Angelica Lang FALCON, NT Erlenmeyer, Bernardino HERO, RN Sebastian Boyer, RN   Problem List:   Principal Problem:   Hemorrhagic shock from ruptured fibroid s/p myomectomy 03/17/2024 Active Problems:   Hypothyroidism due to Hashimoto's thyroiditis   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    Assessment [facility] Stay = 3 days) 3 Days Post-Op    Stabilizing but with postop ileus    Plan:  - No definite return of bowel function but output from NG tube coming down.  Guardedly hopeful sign.  Continue clamping trial.  Once has bowel movements can remove NG tube and advance to full liquid/dysphagia 1 diet.  Try suppositories.  Still struggling with significant pain.  Dilaudid  PCA.  Placed on standing methocarbamol  as well.  Hypokalemia.  Replace IV.  I would not trust the digestive tract for any medications at this time until patient has return of bowel function  She does not have any evidence of any recurrent bleeding or hemorrhagic shock.  OK to go to floor from surgery standpoint.  -monitor electrolytes & replace as needed  Keep K>4, Mg>2, Phos>3.  Mag & Phos low - I wrote to replete  -elevated TSH w h/o  thyroiditis/hypothyroidism - [facility] aware ?restart levothyroixine?  -Dx bipolar vs schizoaffective d/o in past but no meds.  Anxious & tearful at times. - anxiety - lorazepam  PRN.  Defer to [facility] internal medicine   -VTE prophylaxis- SCDs.  Anticoagulation prophyllaxis SQ as appropriate  -mobilize as tolerated to help recovery.  Enlist therapies in moderate/high risk patients as appropriate  I updated the patient's status to the patient, significant other at bedside, ICU nurse.  Recommendations were made.  Questions were answered.  They expressed understanding & appreciation.  -Disposition: TBD.  OK for floor bed per surgery standpoint       I reviewed nursing notes, Consultant ICU notes, last 24 h vitals and pain scores, last 48 h intake and output, last 24 h labs and trends, and last 24 h imaging results.  I have reviewed this patient's available data, including medical history, events of note, test results, etc as part of my evaluation.   A significant portion of that time was spent in counseling. Care during the described time interval was provided by me.  This care required moderate level of medical decision making.  03/20/2024    Subjective: (Chief complaint)  Hemodynamically stable through the night.  Feels less nauseated.  NG tube output less.  No flatus or bowel movement yet.  Returning clamping trial.  Still with significant pain.  Can barely stand.  Dilaudid  PCA helping but helping him sleep.  Significant other nurse in room.       Objective:  Vital signs:  Vitals:   03/20/24 0800 03/20/24  9178 03/20/24 0900 03/20/24 1027  BP: (!) 142/104  (!) 143/98 (!) 131/92  Pulse: (!) 103  98 100  Resp: 17 16 17  (!) 22  Temp: 98.2 F (36.8 C)   98.4 F (36.9 C)  TempSrc: Oral   Oral  SpO2: 96% 96% 97% 99%  Weight:      Height:        Last BM Date :  (PTA)  Intake/Output   Yesterday:  07/04 0701 - 07/05 0700 In: 1053.6 [P.O.:480; I.V.:73.8; IV  Piggyback:499.8] Out: 2535 [Urine:2100; Emesis/NG output:435] This shift:  Total I/O In: 1802.3 [I.V.:1703.1; IV Piggyback:99.1] Out: -   Bowel function:  Flatus: No  BM:  No  Drain: NG tube with thinly bilious effluent   Physical Exam:  General: Pt awake/alert in no acute distress.  Tired but not toxic Eyes: PERRL, normal EOM.  Sclera clear.  No icterus Neuro: CN II-XII intact w/o focal sensory/motor deficits. Lymph: No head/neck/groin lymphadenopathy Psych:  No delerium/psychosis/paranoia.  Oriented x 4 HENT: Normocephalic, Mucus membranes moist.  No thrush Neck: Supple, No tracheal deviation.  No obvious thyromegaly Chest: No pain to chest wall compression.  Good respiratory excursion.  No audible wheezing CV:  Pulses intact.  Regular rhythm.  No major extremity edema MS: Normal AROM mjr joints.  No obvious deformity  Abdomen: Somewhat firm.  Mildy distended.  Sensitive to light touch  Mildly tender at incisions only.  No evidence of peritonitis.  No incarcerated hernias.   Ext:  No deformity.  No mjr edema.  No cyanosis Skin: No petechiae / purpurea.  No major sores.  Warm and dry    Results:   Cultures: Recent Results (from the past 720 hours)  Blood culture (routine x 2)     Status: None (Preliminary result)   Collection Time: 03/17/24 12:39 PM   Specimen: BLOOD  Result Value Ref Range Status   Specimen Description   Final    BLOOD RIGHT ANTECUBITAL Performed at [facility], [address], [place], [place] [address]    Special Requests   Final    BOTTLES DRAWN AEROBIC AND ANAEROBIC Blood Culture results may not be optimal due to an inadequate volume of blood received in culture bottles Performed at [facility], [address] [place], [place] [address]    Culture   Final    NO GROWTH 3 DAYS Performed at [facility], [address], [place], [place] [address]    Report Status PENDING  Incomplete  Blood culture  (routine x 2)     Status: None (Preliminary result)   Collection Time: 03/17/24 12:57 PM   Specimen: BLOOD  Result Value Ref Range Status   Specimen Description   Final    BLOOD LEFT ANTECUBITAL Performed at [facility], [address], [place], [place] [address]    Special Requests   Final    BOTTLES DRAWN AEROBIC AND ANAEROBIC Blood Culture results may not be optimal due to an inadequate volume of blood received in culture bottles Performed at [facility], [address], [place], [place] [address]    Culture   Final    NO GROWTH 3 DAYS Performed at [facility], [address] [place], [place] [address]    Report Status PENDING  Incomplete  Urine Culture     Status: None   Collection Time: 03/17/24  3:00 PM   Specimen: Urine, Catheterized  Result Value Ref Range  Status   Specimen Description   Final    URINE, CATHETERIZED Performed at [facility], [address], [place], [place] [address]    Special Requests   Final    NONE Performed at [facility], [address], [place], [place] [address]    Culture   Final    NO GROWTH Performed at [facility], [address] [place], [place] [address]    Report Status 03/18/2024 FINAL  Final  MRSA Next Gen by PCR, Nasal     Status: None   Collection Time: 03/17/24  8:13 PM   Specimen: Nasal Mucosa; Nasal Swab  Result Value Ref Range Status   MRSA by PCR Next Gen NOT DETECTED NOT DETECTED Final    Comment: (NOTE) The GeneXpert MRSA Assay (FDA approved for NASAL specimens only), is one component of a comprehensive MRSA colonization surveillance program. It is not intended to diagnose MRSA infection nor to guide or monitor treatment for MRSA infections. Test performance is not FDA approved in patients less than 52 years old. Performed at [facility], [address] [place], [place] [address]     Labs: Results for  orders placed or performed during the hospital encounter of 03/17/24 (from the past 48 hours)  Glucose, capillary     Status: None   Collection Time: 03/18/24  1:23 PM  Result Value Ref Range   Glucose-Capillary 95 70 - 99 mg/dL    Comment: Glucose reference range applies only to samples taken after fasting for at least 8 hours.   Comment 1 Notify RN    Comment 2 Document in Chart   CBC     Status: Abnormal   Collection Time: 03/18/24  3:41 PM  Result Value Ref Range   WBC 16.8 (H) 4.0 - 10.5 K/uL   RBC 3.62 (L) 3.87 - 5.11 MIL/uL   Hemoglobin 10.6 (L) 12.0 - 15.0 g/dL   HCT 68.5 (L) 63.9 - 53.9 %   MCV 86.7 80.0 - 100.0 fL   MCH 29.3 26.0 - 34.0 pg   MCHC 33.8 30.0 - 36.0 g/dL   RDW 82.6 (H) 88.4 - 84.4 %   Platelets 78 (L) 150 - 400 K/uL    Comment: Immature Platelet Fraction may be clinically indicated, consider ordering this additional test [id number] CONSISTENT WITH PREVIOUS RESULT REPEATED TO VERIFY    nRBC 0.1 0.0 - 0.2 %    Comment: Performed at [facility], [address] [place], [place] [address]  Glucose, capillary     Status: Abnormal   Collection Time: 03/18/24  4:46 PM  Result Value Ref Range   Glucose-Capillary 106 (H) 70 - 99 mg/dL    Comment: Glucose reference range applies only to samples taken after fasting for at least 8 hours.   Comment 1 Notify RN    Comment 2 Document in Chart   Glucose, capillary     Status: None   Collection Time: 03/18/24  8:04 PM  Result Value Ref Range   Glucose-Capillary 90 70 - 99 mg/dL    Comment: Glucose reference range applies only to samples taken after fasting for at least 8 hours.  Glucose, capillary     Status: None   Collection Time: 03/18/24 11:49 PM  Result Value Ref Range   Glucose-Capillary 97 70 - 99 mg/dL    Comment: Glucose reference range applies only to samples taken after fasting for at least 8 hours.  Glucose, capillary  Status: None   Collection Time: 03/19/24  3:29 AM  Result  Value Ref Range   Glucose-Capillary 80 70 - 99 mg/dL    Comment: Glucose reference range applies only to samples taken after fasting for at least 8 hours.  CBC     Status: Abnormal   Collection Time: 03/19/24  6:08 AM  Result Value Ref Range   WBC 17.1 (H) 4.0 - 10.5 K/uL   RBC 3.58 (L) 3.87 - 5.11 MIL/uL   Hemoglobin 10.5 (L) 12.0 - 15.0 g/dL   HCT 68.6 (L) 63.9 - 53.9 %   MCV 87.4 80.0 - 100.0 fL   MCH 29.3 26.0 - 34.0 pg   MCHC 33.5 30.0 - 36.0 g/dL   RDW 83.0 (H) 88.4 - 84.4 %   Platelets 76 (L) 150 - 400 K/uL    Comment: Immature Platelet Fraction may be clinically indicated, consider ordering this additional test [id number]    nRBC 0.0 0.0 - 0.2 %    Comment: Performed at [facility], [address] [place], [place] [address]  Basic metabolic panel     Status: Abnormal   Collection Time: 03/19/24  6:08 AM  Result Value Ref Range   Sodium 135 135 - 145 mmol/L   Potassium 3.1 (L) 3.5 - 5.1 mmol/L   Chloride 102 98 - 111 mmol/L   CO2 21 (L) 22 - 32 mmol/L   Glucose, Bld 87 70 - 99 mg/dL    Comment: Glucose reference range applies only to samples taken after fasting for at least 8 hours.   BUN <5 (L) 6 - 20 mg/dL   Creatinine, Ser 9.41 0.44 - 1.00 mg/dL   Calcium  7.9 (L) 8.9 - 10.3 mg/dL   GFR, Estimated >39 >39 mL/min    Comment: (NOTE) Calculated using the CKD-EPI Creatinine Equation (2021)    Anion gap 12 5 - 15    Comment: Performed at [facility], [address] [place], [place] [address]  Magnesium      Status: None   Collection Time: 03/19/24  6:08 AM  Result Value Ref Range   Magnesium  1.7 1.7 - 2.4 mg/dL    Comment: Performed at [facility], [address] [place], [place] [address]  Glucose, capillary     Status: None   Collection Time: 03/19/24  8:05 AM  Result Value Ref Range   Glucose-Capillary 75 70 - 99 mg/dL    Comment: Glucose reference range applies only to samples taken after fasting for  at least 8 hours.   Comment 1 Notify RN    Comment 2 Document in Chart   Glucose, capillary     Status: None   Collection Time: 03/19/24 12:46 PM  Result Value Ref Range   Glucose-Capillary 84 70 - 99 mg/dL    Comment: Glucose reference range applies only to samples taken after fasting for at least 8 hours.   Comment 1 Notify RN    Comment 2 Document in Chart   Glucose, capillary     Status: None   Collection Time: 03/19/24  4:46 PM  Result Value Ref Range   Glucose-Capillary 86 70 - 99 mg/dL    Comment: Glucose reference range applies only to samples taken after fasting for at least 8 hours.  Glucose, capillary     Status: None   Collection Time: 03/19/24  8:28 PM  Result Value Ref Range   Glucose-Capillary 87 70 - 99 mg/dL    Comment: Glucose  reference range applies only to samples taken after fasting for at least 8 hours.  Glucose, capillary     Status: None   Collection Time: 03/20/24 12:03 AM  Result Value Ref Range   Glucose-Capillary 84 70 - 99 mg/dL    Comment: Glucose reference range applies only to samples taken after fasting for at least 8 hours.  Glucose, capillary     Status: None   Collection Time: 03/20/24  4:24 AM  Result Value Ref Range   Glucose-Capillary 78 70 - 99 mg/dL    Comment: Glucose reference range applies only to samples taken after fasting for at least 8 hours.  Glucose, capillary     Status: None   Collection Time: 03/20/24  8:13 AM  Result Value Ref Range   Glucose-Capillary 74 70 - 99 mg/dL    Comment: Glucose reference range applies only to samples taken after fasting for at least 8 hours.   Comment 1 Notify RN    Comment 2 Document in Chart   CBC     Status: Abnormal   Collection Time: 03/20/24  8:47 AM  Result Value Ref Range   WBC 13.2 (H) 4.0 - 10.5 K/uL   RBC 3.97 3.87 - 5.11 MIL/uL   Hemoglobin 11.5 (L) 12.0 - 15.0 g/dL   HCT 65.3 (L) 63.9 - 53.9 %   MCV 87.2 80.0 - 100.0 fL   MCH 29.0 26.0 - 34.0 pg   MCHC 33.2 30.0 - 36.0 g/dL    RDW 83.8 (H) 88.4 - 15.5 %   Platelets 112 (L) 150 - 400 K/uL   nRBC 0.2 0.0 - 0.2 %    Comment: Performed at [facility], [address], [place], [place] [address]  Renal function panel     Status: Abnormal   Collection Time: 03/20/24  8:47 AM  Result Value Ref Range   Sodium 135 135 - 145 mmol/L   Potassium 3.6 3.5 - 5.1 mmol/L   Chloride 104 98 - 111 mmol/L   CO2 17 (L) 22 - 32 mmol/L   Glucose, Bld 87 70 - 99 mg/dL    Comment: Glucose reference range applies only to samples taken after fasting for at least 8 hours.   BUN <5 (L) 6 - 20 mg/dL   Creatinine, Ser 9.35 0.44 - 1.00 mg/dL   Calcium  8.3 (L) 8.9 - 10.3 mg/dL   Phosphorus 2.3 (L) 2.5 - 4.6 mg/dL   Albumin 3.5 3.5 - 5.0 g/dL   GFR, Estimated >39 >39 mL/min    Comment: (NOTE) Calculated using the CKD-EPI Creatinine Equation (2021)    Anion gap 14 5 - 15    Comment: Performed at [facility], [address] [place], [place] [address]  Magnesium      Status: Abnormal   Collection Time: 03/20/24  8:47 AM  Result Value Ref Range   Magnesium  1.6 (L) 1.7 - 2.4 mg/dL    Comment: Performed at [facility], [address] [place], [place] [address]    Imaging / Studies: No results found.   Medications / Allergies: per chart  Antibiotics: Anti-infectives (From admission, onward)    Start     Dose/Rate Route Frequency Ordered Stop   03/19/24 1000  piperacillin -tazobactam (ZOSYN ) IVPB 3.375 g        3.375 g 12.5 mL/hr over 240 Minutes Intravenous Every 8 hours 03/19/24 0854     03/18/24 0000  vancomycin  (VANCOCIN ) IVPB 1000 mg/200 mL premix  Status:  Discontinued        1,000 mg 200 mL/hr over 60 Minutes Intravenous Every 24 hours 03/17/24 1649 03/18/24 1055   03/17/24 2200  ceFEPIme  (MAXIPIME ) 2 g in sodium chloride  0.9 % 100 mL IVPB  Status:  Discontinued        2 g 200 mL/hr over 30 Minutes Intravenous 2 times daily 03/17/24 1621 03/18/24 1055   03/17/24 2200   metroNIDAZOLE  (FLAGYL ) IVPB 500 mg  Status:  Discontinued        500 mg 100 mL/hr over 60 Minutes Intravenous 2 times daily 03/17/24 1621 03/18/24 1055   03/17/24 1315  vancomycin  (VANCOCIN ) IVPB 1000 mg/200 mL premix        1,000 mg 200 mL/hr over 60 Minutes Intravenous  Once 03/17/24 1314 03/17/24 1429   03/17/24 1315  piperacillin -tazobactam (ZOSYN ) IVPB 3.375 g        3.375 g 100 mL/hr over 30 Minutes Intravenous  Once 03/17/24 1314 03/17/24 1400         Note: Portions of this report may have been transcribed using voice recognition software. Every effort was made to ensure accuracy; however, inadvertent computerized transcription errors may be present.   Any transcriptional errors that result from this process are unintentional.    Elspeth KYM Schultze, MD, FACS, MASCRS Esophageal, Gastrointestinal & Colorectal Surgery Robotic and Minimally Invasive Surgery  [facility] [address] [place], [place] [address] [phone] Fax [phone] Main  CONTACT INFORMATION: Weekday (9AM-5PM): Call [facility] main office at [phone] Weeknight (5PM-9AM) or Weekend/Holiday: Check EPIC Web Links tab & use AMION (password  [email or web address]) for General Surgery [facility] coverage  Please, DO NOT use SecureChat  (it is not reliable communication to reach operating surgeons & will lead to a delay in care).   Epic staff messaging available for outptient concerns needing 1-2 business day response.      03/20/2024  12:33 PM

## 2024-03-20 NOTE — Progress Notes (Signed)
 Physical Therapy Treatment Patient Details Name: [name] MRN: [id number] DOB: Mar 09, 1991 Today's Date: 03/20/2024   History of Present Illness 33 yo female presents to therapy following hospitalization on 03/17/2024 due to severe lower abdominal pain and agitation. Pt was found to have hemorrhagic shock attributed to ruptured fibroid and is now s/p myomectomy and exploratory lap on 7/2. Pt hospitalization completed due to abn labs, agitation and sock requiring intubation, pt extubated on 7/3 and postop ileus with NG tube placement. Pt required multiple blood transfusions. Pt PMH includes but is not limited to: Hashimoto's thyroiditis, schizoaffective disorder and uterine fibroids.    PT Comments  Pt very cooperative despite elevated pain level.  With increased time, pt assisted to roll to side, move to bedside sitting, stand to step pvt to [facility] and stand from [facility] with recliner brought in to replace.  Pt up in recliner and positioned for comfort.  Pt reports some improvement in pain control vs yesterday.    If plan is discharge home, recommend the following: Two people to help with walking and/or transfers;Two people to help with bathing/dressing/bathroom;Assist for transportation;Help with stairs or ramp for entrance   Can travel by private vehicle        Equipment Recommendations  Rolling walker (2 wheels)    Recommendations for Other Services OT consult     Precautions / Restrictions Precautions Precautions: Fall Restrictions Weight Bearing Restrictions Per Provider Order: No     Mobility  Bed Mobility Overal bed mobility: Needs Assistance Bed Mobility: Rolling, Sidelying to Sit Rolling: Min assist, Mod assist Sidelying to sit: Min assist, Mod assist       General bed mobility comments: cues for log roll technique    Transfers Overall transfer level: Needs assistance Equipment used: None Transfers: Sit to/from Stand, Bed to chair/wheelchair/BSC Sit to Stand: Min  assist, +2 physical assistance, +2 safety/equipment, From elevated surface   Step pivot transfers: Min assist, +2 safety/equipment, From elevated surface       General transfer comment: cues for use of UEs to self assist; min assist to stand and step pvt bed to [facility] and to stand from [facility] for recliner to be switched in    Ambulation/Gait               General Gait Details: NT unable to progress with gait due to pain   Stairs             Wheelchair Mobility     Tilt Bed    Modified Rankin (Stroke Patients Only)       Balance Overall balance assessment: Mild deficits observed, not formally tested                                          Communication Communication Communication: No apparent difficulties  Cognition Arousal: Alert Behavior During Therapy: WFL for tasks assessed/performed   PT - Cognitive impairments: No apparent impairments                         Following commands: Intact      Cueing Cueing Techniques: Verbal cues  Exercises      General Comments        Pertinent Vitals/Pain Pain Assessment Pain Assessment: Faces Faces Pain Scale: Hurts whole lot Pain Location: abdomen with activity Pain Descriptors / Indicators: Aching, Constant, Guarding, Sharp, Tender, Heaviness  Pain Intervention(s): Limited activity within patient's tolerance, Monitored during session, Premedicated before session, PCA encouraged    Home Living                          Prior Function            PT Goals (current goals can now be found in the care plan section) Acute Rehab PT Goals Patient Stated Goal: REgain IND PT Goal Formulation: With patient Time For Goal Achievement: 04/02/24 Potential to Achieve Goals: Good Progress towards PT goals: Progressing toward goals    Frequency    Min 3X/week      PT Plan      Co-evaluation              AM-PAC PT 6 Clicks Mobility   Outcome Measure  Help  needed turning from your back to your side while in a flat bed without using bedrails?: A Lot Help needed moving from lying on your back to sitting on the side of a flat bed without using bedrails?: A Lot Help needed moving to and from a bed to a chair (including a wheelchair)?: A Lot Help needed standing up from a chair using your arms (e.g., wheelchair or bedside chair)?: A Lot Help needed to walk in hospital room?: Total Help needed climbing 3-5 steps with a railing? : Total 6 Click Score: 10    End of Session Equipment Utilized During Treatment: Gait belt Activity Tolerance: Patient limited by pain;Patient limited by fatigue Patient left: in chair;with call bell/phone within reach;with nursing/sitter in room Nurse Communication: Mobility status PT Visit Diagnosis: Unsteadiness on feet (R26.81);Muscle weakness (generalized) (M62.81);Difficulty in walking, not elsewhere classified (R26.2);Pain     Time: 9054-8988 PT Time Calculation (min) (ACUTE ONLY): 26 min  Charges:    $Therapeutic Activity: 23-37 mins PT General Charges $$ ACUTE PT VISIT: 1 Visit                     [facility] PT Acute Rehabilitation Services Office [phone]    Mako Pelfrey 03/20/2024, 11:47 AM

## 2024-03-20 NOTE — Assessment & Plan Note (Signed)
 Initially required intubation.  Received multiple blood transfusions and fresh frozen plasma.  S/p exploratory laparotomy and myomectomy.  Hemoglobin now stable. Extubated on 7/3 and now on room air. Worsening leukocytosis and becoming febrile on 7/4 so started on Zosyn -will complete a 5-day course today Improving leukocytosis  and remained afebrile -Continue with Zosyn -will complete 5 days - Continue with supportive care and pain management

## 2024-03-20 NOTE — Assessment & Plan Note (Signed)
 Mild hypophosphatemia. Magnesium  at 1.6 today -Replete magnesium  and phosphorous

## 2024-03-20 NOTE — Assessment & Plan Note (Signed)
 Resolved  Continue to monitor renal function.

## 2024-03-20 NOTE — Assessment & Plan Note (Signed)
 S/p exploratory laparotomy and myomectomy.  Pain remained uncontrolled on morphine . Still no bowel function.  NG tube to remain in place - General Surgery switch to Dilaudid  PCA -Continue with PCA-wean as tolerated

## 2024-03-20 NOTE — Assessment & Plan Note (Signed)
 Potassium improved to 3.6 today - Continue to monitor and replete as needed

## 2024-03-20 NOTE — Progress Notes (Signed)
 Progress Note   Patient: [name] FMW:[id number] DOB: 1990-12-18 DOA: 03/17/2024     3 DOS: the patient was seen and examined on 03/20/2024   Brief hospital course: From prior notes.  [name] is a 33 y.o. F with PMH significant for Hashimoto's thyroiditis off medication for several years, schizoaffective disorder and uterine fibroids who presented to the ED with severe lower abdominal pain and agitation.   She was later intubated in the ED to protect airway due to extensive agitation and for concern of hemorrhagic shock.  Labs on presentation was with lactic acid more than 15, leukocytosis at 19K, glucose 367.  UDS was positive for cannabinoid Found to have hemoperitoneum with bleeding fibroid s/p laparotomy and myomectomy on 7/2.  Received total of 6 units of PRBC and 2 units of fresh frozen plasma.  Lactic acidosis resolved and hemoglobin stabilized.  Patient was extubated on 7/3, remained stable now on room air so care was transitioned to TRH.  7/4: Vital stable.  Labs with potassium of 3.1 and magnesium  of 1.7-replating electrolytes.  Worsening leukocytosis 17.1, hemoglobin stable at 10.5 and platelets of 76. Patient was febrile at 100.2 yesterday with worsening leukocytosis so starting on Zosyn . Prior urine and blood cultures were negative. Pain uncontrolled on morphine  so she was switched to Dilaudid  PCA by general surgery.  Still no BM or passing gas, significant discharge from NG tube.  7/5: Vitals stable, continue to have significant pain.  Hypomagnesemia at 1.6 send mild hypophosphatemia-electrolytes are being repleted.  Slight worsening of metabolic acidosis, improving leukocytosis. Decreasing secretions from NG tube but no bowel function yet.  Assessment and Plan: * Hemorrhagic shock from ruptured fibroid s/p myomectomy 03/17/2024 Initially required intubation.  Received multiple blood transfusions and fresh frozen plasma.  S/p exploratory laparotomy and myomectomy.   Hemoglobin now stable. Extubated on 7/3 and now on room air. No bowel function yet and uncontrolled pain. Worsening leukocytosis and becoming febrile on 7/4 so started on Zosyn . Improving leukocytosis today and remained afebrile -Continue with Zosyn  - Continue with supportive care and IV fluid    Abdominal pain S/p exploratory laparotomy and myomectomy.  Pain remained uncontrolled on morphine . Still no bowel function.  NG tube to remain in place - General Surgery switch to Dilaudid  PCA -Continue with PCA-wean as tolerated  Encephalopathy acute-resolved as of 03/20/2024 Mentation improved  Acidosis-resolved as of 03/20/2024 Patient with significant lactic acidosis secondary to hemoperitoneum. Has been resolved  AKI (acute kidney injury) (HCC) Resolved. - Continue to monitor renal function  Hyperglycemia Patient with significant hyperglycemia on admission and was placed on SSI.  No prior diagnosis of diabetes.  A1c of 4.8 No more hyperglycemia  noted -Discontinue SSI  Hypothyroidism due to Hashimoto's thyroiditis Patient with prior history of Hashimoto's thyroiditis and not on any supplement anymore. TSH elevated. - Outpatient follow-up and repeat TSH once improved from acute illness  Hypokalemia Potassium improved to 3.6 today - Continue to monitor and replete as needed  Hypomagnesemia Mild hypophosphatemia. Magnesium  at 1.6 today -Replete magnesium  and phosphorous   Subjective: Patient continued to have significant pain which increased significantly when she was placed in a chair.  No bowel movement or passing gas yet.  She was able to void after removal of Foley.  Physical Exam: Vitals:   03/20/24 0821 03/20/24 0900 03/20/24 1027 03/20/24 1235  BP:  (!) 143/98 (!) 131/92   Pulse:  98 100   Resp: 16 17 (!) 22 20  Temp:   98.4 F (  36.9 C)   TempSrc:   Oral   SpO2: 96% 97% 99%   Weight:      Height:       General.  Ill-appearing lady, in no acute  distress. Pulmonary.  Lungs clear bilaterally, normal respiratory effort. CV.  Regular rate and rhythm, no JVD, rub or murmur. Abdomen.  Soft, diffusely tender, mildly distended BS hypoactive. CNS.  Alert and oriented .  No focal neurologic deficit. Extremities.  No edema, no cyanosis, pulses intact and symmetrical. Psychiatry.  Judgment and insight appears normal.   Data Reviewed: Prior data reviewed  Family Communication: Discussed with patient  Disposition: Status is: Inpatient Remains inpatient appropriate because: Severity of illness  Planned Discharge Destination: Home  Time spent: 50 minutes  This record has been created using Conservation officer, historic buildings. Errors have been sought and corrected,but may not always be located. Such creation errors do not reflect on the standard of care.   Author: Amaryllis Dare, MD 03/20/2024 12:51 PM  For on call review [email or web address].

## 2024-03-20 NOTE — Progress Notes (Addendum)
 Pt unable to urinate since tx from ICU, bladder scan = 999 ml. Attempted to insert foley cath X 2 but unable to do so, prn Ativan  given as pt was crying and hyperventilating. Enc pt to have quiet environment (ie tv off, cell phone off and lights low). Reached out to CN from urology and they attempted to place foley X2 as well w/o success. MD notified of above, pt inct of urine of a sm amt but remains very distended.

## 2024-03-20 NOTE — Progress Notes (Signed)
   UROLOGY PROCEDURE NOTE  Indication: Urinary retention, nursing unable to place Foley  33 year old female who presented 03/17/2024 with hemorrhagic shock and hemoperitoneum, found to have bleeding fibroid and underwent exploratory laparotomy.  Unable to urinate with bladder scan greater than 900 mL, reportedly was catheterized yesterday by nursing, but unable to catheterize today and urology was consulted.  The patient was prepped and draped in standard sterile fashion.  On exam, there appeared to be almost 2 openings where the urethra was expected to be.  On the patient's left side this was a false passage, however I was able to advance a 16 [place] Foley through the opening on patient's right side/center into the bladder with gentle pressure and return of yellow urine.  On exam this was readily apparent, and the urethra was not retracted vaginally.  10 cc were placed in the balloon, the catheter was connected to dependent drainage, and the Foley was secured to the thigh.  Plan: - Recommend Foley for 5 to 10 days, can consider void trial as inpatient if remains hospitalized, otherwise contact urology on-call to coordinate outpatient Foley removal - Anticipate retention will resolve when she recovers from ileus and narcotic requirement decreases - Please contact on-call urology if questions or concerns regarding voiding trial  Redell Burnet, MD 03/20/2024   [facility] [address] [place], [place] [address] [phone]

## 2024-03-20 NOTE — Assessment & Plan Note (Signed)
 Patient with significant hyperglycemia on admission and was placed on SSI.  No prior diagnosis of diabetes.  A1c of 4.8 No more hyperglycemia  noted -Discontinue SSI

## 2024-03-21 DIAGNOSIS — R338 Other retention of urine: Secondary | ICD-10-CM | POA: Insufficient documentation

## 2024-03-21 DIAGNOSIS — Z466 Encounter for fitting and adjustment of urinary device: Secondary | ICD-10-CM

## 2024-03-21 DIAGNOSIS — F419 Anxiety disorder, unspecified: Secondary | ICD-10-CM | POA: Insufficient documentation

## 2024-03-21 LAB — GLUCOSE, CAPILLARY
Glucose-Capillary: 103 mg/dL — ABNORMAL HIGH (ref 70–99)
Glucose-Capillary: 118 mg/dL — ABNORMAL HIGH (ref 70–99)
Glucose-Capillary: 126 mg/dL — ABNORMAL HIGH (ref 70–99)
Glucose-Capillary: 126 mg/dL — ABNORMAL HIGH (ref 70–99)
Glucose-Capillary: 134 mg/dL — ABNORMAL HIGH (ref 70–99)
Glucose-Capillary: 146 mg/dL — ABNORMAL HIGH (ref 70–99)
Glucose-Capillary: 147 mg/dL — ABNORMAL HIGH (ref 70–99)

## 2024-03-21 LAB — CREATININE, SERUM
Creatinine, Ser: 0.45 mg/dL (ref 0.44–1.00)
GFR, Estimated: >60 mL/min (ref >60)

## 2024-03-21 LAB — HEMOGLOBIN: Hemoglobin: 10.2 g/dL — ABNORMAL LOW (ref 12.0–15.0)

## 2024-03-21 LAB — POTASSIUM: Potassium: 3.1 mmol/L — ABNORMAL LOW (ref 3.5–5.1)

## 2024-03-21 LAB — PHOSPHORUS: Phosphorus: 3 mg/dL (ref 2.5–4.6)

## 2024-03-21 MED ORDER — POTASSIUM PHOSPHATES 15 MMOLE/5ML IV SOLN
30.0000 mmol | Freq: Once | INTRAVENOUS | Status: AC
  Administered 2024-03-21: 30 mmol via INTRAVENOUS
  Filled 2024-03-21: qty 10

## 2024-03-21 MED ORDER — MAGNESIUM SULFATE 4 GM/100ML IV SOLN
4.0000 g | Freq: Once | INTRAVENOUS | Status: AC
  Administered 2024-03-21: 4 g via INTRAVENOUS
  Filled 2024-03-21: qty 100

## 2024-03-21 MED ORDER — HYDROCORT-PRAMOXINE (PERIANAL) 2.5-1 % EX CREA
TOPICAL_CREAM | Freq: Three times a day (TID) | CUTANEOUS | Status: DC | PRN
  Filled 2024-03-21: qty 30

## 2024-03-21 NOTE — Progress Notes (Signed)
 Progress Note   Patient: [name] FMW:[id number] DOB: 04-10-1991 DOA: 03/17/2024     4 DOS: the patient was seen and examined on 03/21/2024   Brief hospital course: From prior notes.  Shanae Luo is a 33 y.o. F with PMH significant for Hashimoto's thyroiditis off medication for several years, schizoaffective disorder and uterine fibroids who presented to the ED with severe lower abdominal pain and agitation.   She was later intubated in the ED to protect airway due to extensive agitation and for concern of hemorrhagic shock.  Labs on presentation was with lactic acid more than 15, leukocytosis at 19K, glucose 367.  UDS was positive for cannabinoid Found to have hemoperitoneum with bleeding fibroid s/p laparotomy and myomectomy on 7/2.  Received total of 6 units of PRBC and 2 units of fresh frozen plasma.  Lactic acidosis resolved and hemoglobin stabilized.  Patient was extubated on 7/3, remained stable now on room air so care was transitioned to TRH.  7/4: Vital stable.  Labs with potassium of 3.1 and magnesium  of 1.7-replating electrolytes.  Worsening leukocytosis 17.1, hemoglobin stable at 10.5 and platelets of 76. Patient was febrile at 100.2 yesterday with worsening leukocytosis so starting on Zosyn . Prior urine and blood cultures were negative. Pain uncontrolled on morphine  so she was switched to Dilaudid  PCA by general surgery.  Still no BM or passing gas, significant discharge from NG tube.  7/5: Vitals stable, continue to have significant pain.  Hypomagnesemia at 1.6 send mild hypophosphatemia-electrolytes are being repleted.  Slight worsening of metabolic acidosis, improving leukocytosis. Decreasing secretions from NG tube but no bowel function yet.  7/6: Pain little better today, remained on Dilaudid  PCA.  Still no bowel movement.  Foley catheter was replaced by urology yesterday after multiple failed attempts.  Likely developed ileus and urinary retention with significant pain  medications postoperatively.  Urology to keep Foley for 5 to 10 days, she will need voiding trial which can be done by urology as outpatient.  Still no bowel function but improving NG tube secretions.  Assessment and Plan: * Hemorrhagic shock from ruptured fibroid s/p myomectomy 03/17/2024 Initially required intubation.  Received multiple blood transfusions and fresh frozen plasma.  S/p exploratory laparotomy and myomectomy.  Hemoglobin now stable. Extubated on 7/3 and now on room air. No bowel function yet and uncontrolled pain. Worsening leukocytosis and becoming febrile on 7/4 so started on Zosyn . Improving leukocytosis  and remained afebrile -Continue with Zosyn  - Continue with supportive care and IV fluid    Abdominal pain Postoperative ileus S/p exploratory laparotomy and myomectomy.  Pain remained uncontrolled on morphine . Still no bowel function.  NG tube to remain in place - General Surgery switch to Dilaudid  PCA -Continue with PCA-wean as tolerated  Encephalopathy acute-resolved as of 03/20/2024 Mentation improved  Acidosis-resolved as of 03/20/2024 Patient with significant lactic acidosis secondary to hemoperitoneum. Has been resolved  AKI (acute kidney injury) (HCC) Resolved. - Continue to monitor renal function  Hyperglycemia Patient with significant hyperglycemia on admission and was placed on SSI.  No prior diagnosis of diabetes.  A1c of 4.8 No more hyperglycemia  noted -Discontinue SSI  Hypothyroidism due to Hashimoto's thyroiditis Patient with prior history of Hashimoto's thyroiditis and not on any supplement anymore. TSH elevated. - Outpatient follow-up and repeat TSH once improved from acute illness  Hypokalemia Potassium at 3.1 today - Continue to monitor and replete as needed  Hypomagnesemia Mild hypophosphatemia. Electrolytes improving -Continue to monitor and replete as needed  Acute urinary retention  s/p Foley 03/20/2024 Patient was unable to void  yesterday, developed acute urinary retention likely with significant pain medications.  Foley catheter was placed by urology after multiple bedside failed attempts due to difficult anatomy. - Foley to remain in for 5 to 10 days per urology -Voiding trial can be done as outpatient at urology office   Subjective: Patient appears more comfortable and sitting in chair when seen today.  Pain seems controlled but remained on Dilaudid  PCA.  Still no bowel function with no bowel movement or passing flatus.  NG tube in place.  Physical Exam: Vitals:   03/21/24 1007 03/21/24 1137 03/21/24 1142 03/21/24 1343  BP: 118/78   (!) 126/97  Pulse: 77   93  Resp: 16 14 14 18   Temp: 98.6 F (37 C)   97.7 F (36.5 C)  TempSrc: Oral   Oral  SpO2: 99%   99%  Weight:      Height:       General.  Well-developed lady, in no acute distress.  In tube in place Pulmonary.  Lungs clear bilaterally, normal respiratory effort. CV.  Regular rate and rhythm, no JVD, rub or murmur. Abdomen.  Soft, mild diffuse tenderness, nondistended, BS hypoactive. CNS.  Alert and oriented .  No focal neurologic deficit. Extremities.  No edema, no cyanosis, pulses intact and symmetrical. Psychiatry.  Judgment and insight appears normal.   Data Reviewed: Prior data reviewed  Family Communication: Discussed with husband at bedside  Disposition: Status is: Inpatient Remains inpatient appropriate because: Severity of illness  Planned Discharge Destination: Home  Time spent: 50 minutes  This record has been created using Conservation officer, historic buildings. Errors have been sought and corrected,but may not always be located. Such creation errors do not reflect on the standard of care.   Author: Amaryllis Dare, MD 03/21/2024 2:39 PM  For on call review [email or web address].

## 2024-03-21 NOTE — Plan of Care (Signed)
  Problem: Safety: Goal: Violent Restraint(s) Outcome: Progressing   Problem: Education: Goal: Ability to describe self-care measures that may prevent or decrease complications (Diabetes Survival Skills Education) will improve Outcome: Progressing Goal: Individualized Educational Video(s) Outcome: Progressing   Problem: Coping: Goal: Ability to adjust to condition or change in health will improve Outcome: Progressing   Problem: Fluid Volume: Goal: Ability to maintain a balanced intake and output will improve Outcome: Progressing   Problem: Health Behavior/Discharge Planning: Goal: Ability to identify and utilize available resources and services will improve Outcome: Progressing Goal: Ability to manage health-related needs will improve Outcome: Progressing   Problem: Metabolic: Goal: Ability to maintain appropriate glucose levels will improve Outcome: Progressing   Problem: Nutritional: Goal: Maintenance of adequate nutrition will improve Outcome: Progressing Goal: Progress toward achieving an optimal weight will improve Outcome: Progressing   Problem: Skin Integrity: Goal: Risk for impaired skin integrity will decrease Outcome: Progressing   Problem: Tissue Perfusion: Goal: Adequacy of tissue perfusion will improve Outcome: Progressing   Problem: Education: Goal: Ability to describe self-care measures that may prevent or decrease complications (Diabetes Survival Skills Education) will improve Outcome: Progressing Goal: Individualized Educational Video(s) Outcome: Progressing   Problem: Cardiac: Goal: Ability to maintain an adequate cardiac output will improve Outcome: Progressing   Problem: Health Behavior/Discharge Planning: Goal: Ability to identify and utilize available resources and services will improve Outcome: Progressing Goal: Ability to manage health-related needs will improve Outcome: Progressing   Problem: Fluid Volume: Goal: Ability to achieve a  balanced intake and output will improve Outcome: Progressing   Problem: Metabolic: Goal: Ability to maintain appropriate glucose levels will improve Outcome: Progressing   Problem: Nutritional: Goal: Maintenance of adequate nutrition will improve Outcome: Progressing Goal: Maintenance of adequate weight for body size and type will improve Outcome: Progressing   Problem: Respiratory: Goal: Will regain and/or maintain adequate ventilation Outcome: Progressing   Problem: Urinary Elimination: Goal: Ability to achieve and maintain adequate renal perfusion and functioning will improve Outcome: Progressing   Problem: Education: Goal: Knowledge of General Education information will improve Description: Including pain rating scale, medication(s)/side effects and non-pharmacologic comfort measures Outcome: Progressing   Problem: Health Behavior/Discharge Planning: Goal: Ability to manage health-related needs will improve Outcome: Progressing   Problem: Clinical Measurements: Goal: Ability to maintain clinical measurements within normal limits will improve Outcome: Progressing Goal: Will remain free from infection Outcome: Progressing Goal: Diagnostic test results will improve Outcome: Progressing Goal: Respiratory complications will improve Outcome: Progressing Goal: Cardiovascular complication will be avoided Outcome: Progressing   Problem: Activity: Goal: Risk for activity intolerance will decrease Outcome: Progressing   Problem: Nutrition: Goal: Adequate nutrition will be maintained Outcome: Progressing   Problem: Coping: Goal: Level of anxiety will decrease Outcome: Progressing   Problem: Elimination: Goal: Will not experience complications related to bowel motility Outcome: Progressing Goal: Will not experience complications related to urinary retention Outcome: Progressing   Problem: Pain Managment: Goal: General experience of comfort will improve and/or be  controlled Outcome: Progressing   Problem: Safety: Goal: Ability to remain free from injury will improve Outcome: Progressing   Problem: Skin Integrity: Goal: Risk for impaired skin integrity will decrease Outcome: Progressing

## 2024-03-21 NOTE — Progress Notes (Addendum)
 Pt had small emesis episode this AM after walking and drinking some cranberry juice. Pt states she feels like the cranberry juice was too sugary and paired with the walk caused her to become nauseated. PRN administered. Pt requesting to remain on clamping trial.  Pt drank about 90 mL of water over the next hour and when hooked back to NGT, residual was 25mL. Pt continues on clamping trial. Will reconnect if further GI distress.  14:30 - Pt walking halls, tolerating CLD without abdominal pain or nausea. Residual < 50mL. NGT removed and pt advanced to FLD.

## 2024-03-21 NOTE — Discharge Instructions (Signed)
 [facility]

## 2024-03-21 NOTE — Progress Notes (Signed)
 03/21/2024  HUNTLEIGH DOOLEN [id number] 11-09-1990  CARE TEAM: PCP: Gladis Elsie BROCKS, PA-C  Outpatient Care Team: Patient Care Team: Gladis Elsie BROCKS DEVONNA as PCP - General (Family Medicine) Debby Hila, MD as Consulting Physician (Colon and Rectal Surgery) Francisca Redell BROCKS, MD as Consulting Physician (Urology)  Inpatient Treatment Team: Treatment Team:  Caleen Qualia, MD Pccm, Md, MD Ccs, Md, MD Bobbette Civil, MD Erlenmeyer, Bernardino HERO, RN Bryant-Briggs, [place], [place] Lucious Maurilio LITTIE, RN   Problem List:   Principal Problem:   Hemorrhagic shock from ruptured fibroid s/p myomectomy 03/17/2024 Active Problems:   Hypothyroidism due to Hashimoto's thyroiditis   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    Assessment [facility] Stay = 4 days) 4 Days Post-Op    Stabilizing but with postop ileus    Plan:  Retry clamping trial this morning.  If tolerates clear liquids with residual less than 300 mL, remove NG tube Sunday afternoon and advance to full liquid diet.  Rectal suppositories.  Pain control a challenge but seeming to get a little better.    Switched from morphine  to Dilaudid  PCA 7/4.   Placed on standing methocarbamol  7/3.  Increased 7/5.   Hopefully if can get NG tube out and tolerating liquids can transition to oral pain regimen which may have a smoother pain control recovery  Acute urinary retention with difficult urethral intubation requiring urology help.  Suspect will need to feed Foley for at least 72 hours if not longer.  Defer to urology.  Need for or  other antispasmodics Flomax defer to urology.  Hopefully she will have better recovery with bladder rest, get off PCA, mobilize more  Hypokalemia.  Replace IV.    Hypomagnesemia replaced.  Recheck  Hypophosphatemia replaced.  Recheck.  She does not have any evidence of any recurrent bleeding or hemorrhagic shock.  Follow  Try and keep on the dry side.  I would ordered Lasix  for diuresis but given significant hypokalemia we will hold off and defer to medicine.  -monitor electrolytes & replace as needed  Keep K>4, Mg>2, Phos>3.  Mag & Phos low - I wrote to replete  -elevated TSH w h/o thyroiditis/hypothyroidism - TR[facility] aware ?restart levothyroixine?  -Dx bipolar vs schizoaffective d/o in past but no meds.  Anxious & tearful at times. - anxiety - lorazepam  PRN.  Defer to [facility] internal medicine   -VTE prophylaxis- SCDs.  Anticoagulation prophyllaxis SQ as appropriate  -mobilize as tolerated to help recovery.  Enlist therapies in moderate/high risk patients as appropriate  I updated the patient's status to the patient, significant other at bedside, ICU nurse.  Recommendations were made.  Questions were answered.  They expressed understanding & appreciation.  -Disposition: TBD.  OK for floor bed per surgery standpoint       I reviewed nursing notes, Consultant ICU notes, last 24 h vitals and pain scores, last 48 h intake and output, last 24 h labs and trends, and last 24 h imaging results.  I have reviewed this patient's available data, including medical history, events of note, test results, etc as part of  my evaluation.   A significant portion of that time was spent in counseling. Care during the described time interval was provided by me.  This care required moderate level of medical decision making.  03/21/2024    Subjective: (Chief complaint)  Patient with difficulty with urination and spotting.  Not able to have catheter placed by surgical and urology nurses help so required  urologist MD to place.  Large volume returned. Apparently stepmother rather upset but patient appreciated.  Patient feeling better with intermittent lorazepam  and less anxious.  Feels like she is starting to pass some gas and had a few small bowel movements.  Less belching and no nausea.  Is starting to walk into the room but has not walked in the hallways.  Significant other in room.  Nursing just outside room         Objective:  Vital signs:  Vitals:   03/21/24 0120 03/21/24 0434 03/21/24 0500 03/21/24 0529  BP: 129/83   124/84  Pulse: 95   88  Resp: 19 18  17   Temp: 99.2 F (37.3 C)   98.2 F (36.8 C)  TempSrc: Oral   Oral  SpO2: 99%   98%  Weight:   69.8 kg   Height:        Last BM Date :  (prior to admission)  Intake/Output   Yesterday:  07/05 0701 - 07/06 0700 In: 5334.7 [P.O.:240; I.V.:2920.6; IV Piggyback:1504.6] Out: 5650 [Urine:5000; Emesis/NG output:650] This shift:  No intake/output data recorded.  Bowel function:  Flatus: YES  BM:  YES  Drain: NG tube with thinly bilious effluent   Physical Exam:  General: Pt awake/alert in no acute distress.  Tired but not toxic Eyes: PERRL, normal EOM.  Sclera clear.  No icterus Neuro: CN II-XII intact w/o focal sensory/motor deficits. Lymph: No head/neck/groin lymphadenopathy Psych:  No delerium/psychosis/paranoia.  Oriented x 31.  Seems more calm and comfortable.  Not anxious or tearful HENT: Normocephalic, Mucus membranes moist.  No thrush Neck: Supple, No tracheal deviation.  No obvious thyromegaly Chest: No pain to chest wall compression.  Good respiratory excursion.  No audible wheezing CV:  Pulses intact.  Regular rhythm.  No major extremity edema MS: Normal AROM mjr joints.  No obvious deformity  Abdomen: Soft.  Mildy distended.  Sensitive to light touch  Mildly tender at incision only.  Incision clean dry and intact without any cellulitis.  Staples in place.  No evidence of peritonitis.  No  incarcerated hernias.    Ext:  No deformity.  No mjr edema.  No cyanosis Skin: No petechiae / purpurea.  No major sores.  Warm and dry    Results:   Cultures: Recent Results (from the past 720 hours)  Blood culture (routine x 2)     Status: None (Preliminary result)   Collection Time: 03/17/24 12:39 PM   Specimen: BLOOD  Result Value Ref Range Status   Specimen Description   Final    BLOOD RIGHT ANTECUBITAL Performed at [facility], [address], [place], [place] [address]    Special Requests   Final    BOTTLES DRAWN AEROBIC AND ANAEROBIC Blood Culture results may not be optimal due to an inadequate volume of blood received in culture bottles Performed at [facility], [address] [place], [place] [address]    Culture   Final    NO GROWTH 4 DAYS Performed at [facility], [address] [place], [place] [address]  Report Status PENDING  Incomplete  Blood culture (routine x 2)     Status: None (Preliminary result)   Collection Time: 03/17/24 12:57 PM   Specimen: BLOOD  Result Value Ref Range Status   Specimen Description   Final    BLOOD LEFT ANTECUBITAL Performed at [facility], [address] [place], [place] [address]    Special Requests   Final    BOTTLES DRAWN AEROBIC AND ANAEROBIC Blood Culture results may not be optimal due to an inadequate volume of blood received in culture bottles Performed at [facility], [address] [place], [place] [address]    Culture   Final    NO GROWTH 4 DAYS Performed at [facility], [address] [place], [place] [address]    Report Status PENDING  Incomplete  Urine Culture     Status: None   Collection Time: 03/17/24  3:00 PM   Specimen: Urine, Catheterized  Result Value Ref Range Status   Specimen Description   Final    URINE, CATHETERIZED Performed at [facility], [address], [place], [place]  [address]    Special Requests   Final    NONE Performed at [facility], [address] [place], [place] [address]    Culture   Final    NO GROWTH Performed at [facility], [address], [place], [place] [address]    Report Status 03/18/2024 FINAL  Final  MRSA Next Gen by PCR, Nasal     Status: None   Collection Time: 03/17/24  8:13 PM   Specimen: Nasal Mucosa; Nasal Swab  Result Value Ref Range Status   MRSA by PCR Next Gen NOT DETECTED NOT DETECTED Final    Comment: (NOTE) The GeneXpert MRSA Assay (FDA approved for NASAL specimens only), is one component of a comprehensive MRSA colonization surveillance program. It is not intended to diagnose MRSA infection nor to guide or monitor treatment for MRSA infections. Test performance is not FDA approved in patients less than 57 years old. Performed at [facility], [address], [place], [place] [address]     Labs: Results for orders placed or performed during the hospital encounter of 03/17/24 (from the past 48 hours)  Glucose, capillary     Status: None   Collection Time: 03/19/24 12:46 PM  Result Value Ref Range   Glucose-Capillary 84 70 - 99 mg/dL    Comment: Glucose reference range applies only to samples taken after fasting for at least 8 hours.   Comment 1 Notify RN    Comment 2 Document in Chart   Glucose, capillary     Status: None   Collection Time: 03/19/24  4:46 PM  Result Value Ref Range   Glucose-Capillary 86 70 - 99 mg/dL    Comment: Glucose reference range applies only to samples taken after fasting for at least 8 hours.  Glucose, capillary     Status: None   Collection Time: 03/19/24  8:28 PM  Result Value Ref Range   Glucose-Capillary 87 70 - 99 mg/dL    Comment: Glucose reference range applies only to samples taken after fasting for at least 8 hours.  Glucose, capillary     Status: None   Collection Time: 03/20/24 12:03 AM  Result Value Ref Range    Glucose-Capillary 84 70 - 99 mg/dL    Comment: Glucose reference range applies only to samples taken after fasting for at  least 8 hours.  Glucose, capillary     Status: None   Collection Time: 03/20/24  4:24 AM  Result Value Ref Range   Glucose-Capillary 78 70 - 99 mg/dL    Comment: Glucose reference range applies only to samples taken after fasting for at least 8 hours.  Glucose, capillary     Status: None   Collection Time: 03/20/24  8:13 AM  Result Value Ref Range   Glucose-Capillary 74 70 - 99 mg/dL    Comment: Glucose reference range applies only to samples taken after fasting for at least 8 hours.   Comment 1 Notify RN    Comment 2 Document in Chart   CBC     Status: Abnormal   Collection Time: 03/20/24  8:47 AM  Result Value Ref Range   WBC 13.2 (H) 4.0 - 10.5 K/uL   RBC 3.97 3.87 - 5.11 MIL/uL   Hemoglobin 11.5 (L) 12.0 - 15.0 g/dL   HCT 65.3 (L) 63.9 - 53.9 %   MCV 87.2 80.0 - 100.0 fL   MCH 29.0 26.0 - 34.0 pg   MCHC 33.2 30.0 - 36.0 g/dL   RDW 83.8 (H) 88.4 - 84.4 %   Platelets 112 (L) 150 - 400 K/uL   nRBC 0.2 0.0 - 0.2 %    Comment: Performed at [facility], [address], [place], [place] [address]  Renal function panel     Status: Abnormal   Collection Time: 03/20/24  8:47 AM  Result Value Ref Range   Sodium 135 135 - 145 mmol/L   Potassium 3.6 3.5 - 5.1 mmol/L   Chloride 104 98 - 111 mmol/L   CO2 17 (L) 22 - 32 mmol/L   Glucose, Bld 87 70 - 99 mg/dL    Comment: Glucose reference range applies only to samples taken after fasting for at least 8 hours.   BUN <5 (L) 6 - 20 mg/dL   Creatinine, Ser 9.35 0.44 - 1.00 mg/dL   Calcium  8.3 (L) 8.9 - 10.3 mg/dL   Phosphorus 2.3 (L) 2.5 - 4.6 mg/dL   Albumin 3.5 3.5 - 5.0 g/dL   GFR, Estimated >39 >39 mL/min    Comment: (NOTE) Calculated using the CKD-EPI Creatinine Equation (2021)    Anion gap 14 5 - 15    Comment: Performed at [facility], [address] [place],  [place] [address]  Magnesium      Status: Abnormal   Collection Time: 03/20/24  8:47 AM  Result Value Ref Range   Magnesium  1.6 (L) 1.7 - 2.4 mg/dL    Comment: Performed at [facility], [address], [place], [place] [address]  Glucose, capillary     Status: Abnormal   Collection Time: 03/21/24 12:18 AM  Result Value Ref Range   Glucose-Capillary 146 (H) 70 - 99 mg/dL    Comment: Glucose reference range applies only to samples taken after fasting for at least 8 hours.  Glucose, capillary     Status: Abnormal   Collection Time: 03/21/24  3:04 AM  Result Value Ref Range   Glucose-Capillary 147 (H) 70 - 99 mg/dL    Comment: Glucose reference range applies only to samples taken after fasting for at least 8 hours.  Hemoglobin     Status: Abnormal   Collection Time: 03/21/24  4:35 AM  Result Value Ref Range   Hemoglobin 10.2 (L) 12.0 - 15.0 g/dL    Comment: Performed at [facility], [address] [place], [place] [address]  Potassium     Status: Abnormal   Collection Time: 03/21/24  4:35 AM  Result Value Ref Range   Potassium 3.1 (L) 3.5 - 5.1 mmol/L    Comment: Performed at [facility], [address] [place], [place] [address]  Creatinine, serum     Status: None   Collection Time: 03/21/24  4:35 AM  Result Value Ref Range   Creatinine, Ser 0.45 0.44 - 1.00 mg/dL   GFR, Estimated >39 >39 mL/min    Comment: (NOTE) Calculated using the CKD-EPI Creatinine Equation (2021) Performed at [facility], [address] [place], [place] [address]   Glucose, capillary     Status: Abnormal   Collection Time: 03/21/24  7:47 AM  Result Value Ref Range   Glucose-Capillary 126 (H) 70 - 99 mg/dL    Comment: Glucose reference range applies only to samples taken after fasting for at least 8 hours.    Imaging / Studies: No results found.   Medications / Allergies: per chart  Antibiotics: Anti-infectives (From  admission, onward)    Start     Dose/Rate Route Frequency Ordered Stop   03/19/24 1000  piperacillin -tazobactam (ZOSYN ) IVPB 3.375 g        3.375 g 12.5 mL/hr over 240 Minutes Intravenous Every 8 hours 03/19/24 0854     03/18/24 0000  vancomycin  (VANCOCIN ) IVPB 1000 mg/200 mL premix  Status:  Discontinued        1,000 mg 200 mL/hr over 60 Minutes Intravenous Every 24 hours 03/17/24 1649 03/18/24 1055   03/17/24 2200  ceFEPIme  (MAXIPIME ) 2 g in sodium chloride  0.9 % 100 mL IVPB  Status:  Discontinued        2 g 200 mL/hr over 30 Minutes Intravenous 2 times daily 03/17/24 1621 03/18/24 1055   03/17/24 2200  metroNIDAZOLE  (FLAGYL ) IVPB 500 mg  Status:  Discontinued        500 mg 100 mL/hr over 60 Minutes Intravenous 2 times daily 03/17/24 1621 03/18/24 1055   03/17/24 1315  vancomycin  (VANCOCIN ) IVPB 1000 mg/200 mL premix        1,000 mg 200 mL/hr over 60 Minutes Intravenous  Once 03/17/24 1314 03/17/24 1429   03/17/24 1315  piperacillin -tazobactam (ZOSYN ) IVPB 3.375 g        3.375 g 100 mL/hr over 30 Minutes Intravenous  Once 03/17/24 1314 03/17/24 1400         Note: Portions of this report may have been transcribed using voice recognition software. Every effort was made to ensure accuracy; however, inadvertent computerized transcription errors may be present.   Any transcriptional errors that result from this process are unintentional.    Elspeth KYM Schultze, MD, FACS, MASCRS Esophageal, Gastrointestinal & Colorectal Surgery Robotic and Minimally Invasive Surgery  [facility] [address], [address] [place], [place] [address] [phone] Fax [phone] Main  CONTACT INFORMATION: Weekday (9AM-5PM): Call [facility] main office at [phone] Weeknight (5PM-9AM) or Weekend/Holiday: Check EPIC Web Links tab & use AMION (password  [email or web address]) for General Surgery [facility] coverage  Please, DO NOT use SecureChat  (it is not reliable  communication to reach operating surgeons & will lead to a delay in care).   Epic staff messaging available for outptient concerns needing 1-2 business day response.      03/21/2024  8:13 AM

## 2024-03-21 NOTE — Assessment & Plan Note (Signed)
 Patient was unable to void yesterday, developed acute urinary retention likely with significant pain medications.  Foley catheter was placed by urology after multiple bedside failed attempts due to difficult anatomy. - Foley to remain in for 5 to 10 days per urology -Voiding trial can be done as outpatient at urology office

## 2024-03-21 NOTE — TOC Initial Note (Signed)
 Transition of Care [facility]) - Initial/Assessment Note    Patient Details  Name: [name] MRN: [id number] Date of Birth: June 01, 1991  Transition of [facility]) CM/SW Contact:    Sonda Manuella Quill, RN Phone Number: 03/21/2024, 10:44 AM  Clinical Narrative:                 [facility] consult for med assistance; no PCP/insurance listed; orders for HHPT also received; spoke w/ pt in room; pt says she lives at home w/ her spouse [name] [phone]); she plans to return at d/c; pt says her spouse will provide transportation; she denied SDOH risks; pt says she does not have insurance/PCP; she also says I have not taken her medication in years d/t cost; pt says she does not have DME, HH services, or home oxygen; pt agreed to receive resources for social services, financial assistance, and [facility]; resources placed in d/c instructions; copies of resources also given to pt; she will make her own appt at agencies of choice; pt agreed to receive recc RW; pt says she cannot afford to pay out of pocket for dme; spoke w/ Jermaine at [place], and RW will be delivered to room; awaiting OT eval before setting up [facility] services; TOC is following.  Expected Discharge Plan: Home w Home Health Services Barriers to Discharge: Continued Medical Work up   Patient Goals and CMS Choice Patient states their goals for this hospitalization and ongoing recovery are:: home          Expected Discharge Plan and Services   Discharge Planning Services: CM Consult   Living arrangements for the past 2 months: Apartment                                      Prior Living Arrangements/Services Living arrangements for the past 2 months: Apartment Lives with:: Spouse Patient language and need for interpreter reviewed:: Yes Do you feel safe going back to the place where you live?: Yes      Need for Family Participation in Patient Care: Yes (Comment) Care giver support system in  place?: Yes (comment) Current home services:  (n/a) Criminal Activity/Legal Involvement Pertinent to Current Situation/Hospitalization: No - Comment as needed  Activities of Daily Living   ADL Screening (condition at time of admission) Independently performs ADLs?: Yes (appropriate for developmental age) Is the patient deaf or have difficulty hearing?: No Does the patient have difficulty seeing, even when wearing glasses/contacts?: No Does the patient have difficulty concentrating, remembering, or making decisions?: No  Permission Sought/Granted Permission sought to share information with : Case Manager Permission granted to share information with : Yes, Verbal Permission Granted  Share Information with NAME: Case Manager     Permission granted to share info w Relationship: [name] (spouse) [phone]     Emotional Assessment Appearance:: Appears stated age Attitude/Demeanor/Rapport: Gracious Affect (typically observed): Accepting Orientation: : Oriented to Self, Oriented to Place, Oriented to  Time, Oriented to Situation Alcohol / Substance Use: Not Applicable Psych Involvement: No (comment)  Admission diagnosis:  Acidosis [E87.20] Delirium [R41.0] Hyperglycemia [R73.9] Abdominal pain [R10.9] Patient Active Problem List   Diagnosis Date Noted   Anxiety 03/21/2024   Acute urinary retention s/p Foley 03/20/2024 03/21/2024   Difficult catheterization 03/21/2024   Schizoaffective disorder (HCC) 03/20/2024   Anemia 03/20/2024   Hypophosphatemia 03/20/2024   Fibroid uterus 03/19/2024   AKI (acute kidney  injury) (HCC) 03/19/2024   Hypokalemia 03/19/2024   Hypomagnesemia 03/19/2024   Hemorrhagic shock from ruptured fibroid s/p myomectomy 03/17/2024 03/18/2024   Abdominal pain 03/17/2024   Hyperglycemia 03/17/2024   Hepatomegaly 08/19/2020   Hyperbilirubinemia 08/19/2020   Hyperprolactinemia (HCC)    Amenorrhea, secondary    Hirsutism    Bipolar disorder (HCC)     Hypothyroidism due to Hashimoto's thyroiditis 01/31/2011   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 [place] [address] Phone: [phone] Fax: [phone]  [facility] [address] [place] [address] Phone: [phone] Fax: [phone]  [id number] [place], [place] - [address] AT [facility] & [address] [place] [address] Phone: [phone] Fax: [phone]     Social Drivers of Health (SDOH) Social History: SDOH Screenings   Food Insecurity: No Food Insecurity (03/21/2024)  Recent Concern: Food Insecurity - Food Insecurity Present (03/19/2024)  Housing: Low Risk  (03/21/2024)  Recent Concern: Housing - High Risk (03/19/2024)  Transportation Needs: No Transportation Needs (03/21/2024)  Utilities: Not At Risk (03/21/2024)  Tobacco Use: Medium Risk (03/20/2024)   SDOH Interventions: Food Insecurity Interventions: Intervention Not Indicated, Inpatient TOC Housing Interventions: Intervention Not Indicated, Inpatient TOC Transportation Interventions: Intervention Not Indicated, Inpatient TOC Utilities Interventions: Intervention Not Indicated, Inpatient TOC   Readmission Risk Interventions     No data to display

## 2024-03-21 NOTE — Plan of Care (Signed)
   Problem: Education: Goal: Ability to describe self-care measures that may prevent or decrease complications (Diabetes Survival Skills Education) will improve Outcome: Progressing   Problem: Coping: Goal: Ability to adjust to condition or change in health will improve Outcome: Progressing   Problem: Fluid Volume: Goal: Ability to maintain a balanced intake and output will improve Outcome: Progressing

## 2024-03-21 NOTE — Progress Notes (Signed)
 Wasted 3mg  of Dilaudid  from PCA during syringe change. Witnessed by Verneita Hummer, RN.

## 2024-03-21 NOTE — Progress Notes (Signed)
 Mobility Specialist - Progress Note   03/21/24 0936  Mobility  Activity Ambulated with assistance in hallway  Level of Assistance Standby assist, set-up cues, supervision of patient - no hands on  Assistive Device Front wheel walker  Distance Ambulated (ft) 275 ft  Activity Response Tolerated well  Mobility Referral Yes  Mobility visit 1 Mobility  Mobility Specialist Stop Time (ACUTE ONLY) 0936   Pt received in bed and agreeable to mobility. C/o stomach spasms during session requiring x1 standing rest break. No other complaints during session. Pt to recliner after session with all needs met.    [facility]

## 2024-03-22 DIAGNOSIS — R338 Other retention of urine: Secondary | ICD-10-CM

## 2024-03-22 DIAGNOSIS — F419 Anxiety disorder, unspecified: Secondary | ICD-10-CM

## 2024-03-22 LAB — PREPARE FRESH FROZEN PLASMA

## 2024-03-22 LAB — BPAM FFP
Blood Product Expiration Date: 202507072359
Blood Product Expiration Date: 202507082359
ISSUE DATE / TIME: 202507022320
ISSUE DATE / TIME: 202507030120
Unit Type and Rh: 5100
Unit Type and Rh: 5100

## 2024-03-22 LAB — BASIC METABOLIC PANEL WITH GFR
Anion gap: 10 (ref 5–15)
BUN: <5 mg/dL — ABNORMAL LOW (ref 6–20)
CO2: 22 mmol/L (ref 22–32)
Calcium: 7.9 mg/dL — ABNORMAL LOW (ref 8.9–10.3)
Chloride: 101 mmol/L (ref 98–111)
Creatinine, Ser: 0.68 mg/dL (ref 0.44–1.00)
GFR, Estimated: >60 mL/min (ref >60)
Glucose, Bld: 114 mg/dL — ABNORMAL HIGH (ref 70–99)
Potassium: 3.1 mmol/L — ABNORMAL LOW (ref 3.5–5.1)
Sodium: 133 mmol/L — ABNORMAL LOW (ref 135–145)

## 2024-03-22 LAB — GLUCOSE, CAPILLARY
Glucose-Capillary: 106 mg/dL — ABNORMAL HIGH (ref 70–99)
Glucose-Capillary: 103 mg/dL — ABNORMAL HIGH (ref 70–99)
Glucose-Capillary: 126 mg/dL — ABNORMAL HIGH (ref 70–99)
Glucose-Capillary: 107 mg/dL — ABNORMAL HIGH (ref 70–99)
Glucose-Capillary: 96 mg/dL (ref 70–99)

## 2024-03-22 LAB — CULTURE, BLOOD (ROUTINE X 2)
Culture: NO GROWTH
Culture: NO GROWTH

## 2024-03-22 LAB — CBC
HCT: 32.3 % — ABNORMAL LOW (ref 36.0–46.0)
Hemoglobin: 10.7 g/dL — ABNORMAL LOW (ref 12.0–15.0)
MCH: 28.8 pg (ref 26.0–34.0)
MCHC: 33.1 g/dL (ref 30.0–36.0)
MCV: 87.1 fL (ref 80.0–100.0)
Platelets: 195 K/uL (ref 150–400)
RBC: 3.71 MIL/uL — ABNORMAL LOW (ref 3.87–5.11)
RDW: 16.3 % — ABNORMAL HIGH (ref 11.5–15.5)
WBC: 8.2 K/uL (ref 4.0–10.5)
nRBC: 0 % (ref 0.0–0.2)

## 2024-03-22 LAB — SURGICAL PATHOLOGY

## 2024-03-22 LAB — PHOSPHORUS: Phosphorus: 2.6 mg/dL (ref 2.5–4.6)

## 2024-03-22 LAB — MAGNESIUM: Magnesium: 2 mg/dL (ref 1.7–2.4)

## 2024-03-22 MED ORDER — OXYCODONE HCL 5 MG PO TABS
5.0000 mg | ORAL_TABLET | ORAL | Status: DC | PRN
  Administered 2024-03-22 – 2024-03-23 (×3): 10 mg via ORAL
  Filled 2024-03-22 (×4): qty 2

## 2024-03-22 MED ORDER — POTASSIUM CHLORIDE 20 MEQ PO PACK
40.0000 meq | PACK | Freq: Two times a day (BID) | ORAL | Status: AC
  Administered 2024-03-22 (×2): 40 meq via ORAL
  Filled 2024-03-22 (×2): qty 2

## 2024-03-22 MED ORDER — HYDROMORPHONE HCL 1 MG/ML IJ SOLN
1.0000 mg | INTRAMUSCULAR | Status: DC | PRN
  Administered 2024-03-22 – 2024-03-23 (×3): 1 mg via INTRAVENOUS
  Filled 2024-03-22 (×3): qty 1

## 2024-03-22 NOTE — Progress Notes (Signed)
 D/c pt's dilaudid  PCA. Wasted 16mg  of dilaudid  with Chiquita Gallus, RN. Documented on pyxis.

## 2024-03-22 NOTE — Progress Notes (Addendum)
 Patient is complaining of pain in her buttocks area, checked her buttocks area, with small blisters, educated the patient to turn her side and not sleep in her back for a long period of time. Barrier cream applied.  2400- Message hospitalist on call Blondie, if he can order any cream for it, awaiting for his reply.   0005 -Checked the patient with charge nurse. Patient is in her right side, Offered help to turn her in her Left side, and she said that she will call if she need assistance to be turn.  Informed her that she can call anytime if she needs help to be turn, Inform her that I will check her every two hours.  9983 Hospitalist Daniel replied, and said that barrier cream is ok, and just to put foam on it.

## 2024-03-22 NOTE — Plan of Care (Signed)
  Problem: Safety: Goal: Violent Restraint(s) Outcome: Progressing   Problem: Education: Goal: Ability to describe self-care measures that may prevent or decrease complications (Diabetes Survival Skills Education) will improve Outcome: Progressing Goal: Individualized Educational Video(s) Outcome: Progressing   Problem: Coping: Goal: Ability to adjust to condition or change in health will improve Outcome: Progressing   Problem: Fluid Volume: Goal: Ability to maintain a balanced intake and output will improve Outcome: Progressing   Problem: Health Behavior/Discharge Planning: Goal: Ability to identify and utilize available resources and services will improve Outcome: Progressing Goal: Ability to manage health-related needs will improve Outcome: Progressing   Problem: Metabolic: Goal: Ability to maintain appropriate glucose levels will improve Outcome: Progressing   Problem: Nutritional: Goal: Maintenance of adequate nutrition will improve Outcome: Progressing Goal: Progress toward achieving an optimal weight will improve Outcome: Progressing   Problem: Skin Integrity: Goal: Risk for impaired skin integrity will decrease Outcome: Progressing   Problem: Tissue Perfusion: Goal: Adequacy of tissue perfusion will improve Outcome: Progressing   Problem: Education: Goal: Ability to describe self-care measures that may prevent or decrease complications (Diabetes Survival Skills Education) will improve Outcome: Progressing Goal: Individualized Educational Video(s) Outcome: Progressing   Problem: Cardiac: Goal: Ability to maintain an adequate cardiac output will improve Outcome: Progressing   Problem: Health Behavior/Discharge Planning: Goal: Ability to identify and utilize available resources and services will improve Outcome: Progressing Goal: Ability to manage health-related needs will improve Outcome: Progressing   Problem: Fluid Volume: Goal: Ability to achieve a  balanced intake and output will improve Outcome: Progressing   Problem: Metabolic: Goal: Ability to maintain appropriate glucose levels will improve Outcome: Progressing   Problem: Nutritional: Goal: Maintenance of adequate nutrition will improve Outcome: Progressing Goal: Maintenance of adequate weight for body size and type will improve Outcome: Progressing   Problem: Respiratory: Goal: Will regain and/or maintain adequate ventilation Outcome: Progressing   Problem: Urinary Elimination: Goal: Ability to achieve and maintain adequate renal perfusion and functioning will improve Outcome: Progressing   Problem: Education: Goal: Knowledge of General Education information will improve Description: Including pain rating scale, medication(s)/side effects and non-pharmacologic comfort measures Outcome: Progressing   Problem: Health Behavior/Discharge Planning: Goal: Ability to manage health-related needs will improve Outcome: Progressing   Problem: Clinical Measurements: Goal: Ability to maintain clinical measurements within normal limits will improve Outcome: Progressing Goal: Will remain free from infection Outcome: Progressing Goal: Diagnostic test results will improve Outcome: Progressing Goal: Respiratory complications will improve Outcome: Progressing Goal: Cardiovascular complication will be avoided Outcome: Progressing   Problem: Activity: Goal: Risk for activity intolerance will decrease Outcome: Progressing   Problem: Nutrition: Goal: Adequate nutrition will be maintained Outcome: Progressing   Problem: Coping: Goal: Level of anxiety will decrease Outcome: Progressing   Problem: Elimination: Goal: Will not experience complications related to bowel motility Outcome: Progressing Goal: Will not experience complications related to urinary retention Outcome: Progressing   Problem: Pain Managment: Goal: General experience of comfort will improve and/or be  controlled Outcome: Progressing   Problem: Safety: Goal: Ability to remain free from injury will improve Outcome: Progressing   Problem: Skin Integrity: Goal: Risk for impaired skin integrity will decrease Outcome: Progressing

## 2024-03-22 NOTE — Progress Notes (Signed)
 Physical Therapy Discharge Patient Details Name: [name] MRN: [id number] DOB: August 27, 1991 Today's Date: 03/22/2024 Time: 8941-8887 PT Time Calculation (min) (ACUTE ONLY): 14 min  Patient discharged from PT services secondary to goals met and no further PT needs identified.  Please see latest therapy progress note for current level of functioning and progress toward goals.    Progress and discharge plan discussed with patient and/or caregiver: Patient/Caregiver agrees with plan Darice Potters PT Acute Rehabilitation Services Office [phone]  GP     [name] 03/22/2024, 1:27 PM

## 2024-03-22 NOTE — Progress Notes (Signed)
 Progress Note   Patient: [name] FMW:[id number] DOB: 12-19-1990 DOA: 03/17/2024     5 DOS: the patient was seen and examined on 03/22/2024   Brief hospital course: From prior notes.  [name] is a 33 y.o. F with PMH significant for Hashimoto's thyroiditis off medication for several years, schizoaffective disorder and uterine fibroids who presented to the ED with severe lower abdominal pain and agitation.   She was later intubated in the ED to protect airway due to extensive agitation and for concern of hemorrhagic shock.  Labs on presentation was with lactic acid more than 15, leukocytosis at 19K, glucose 367.  UDS was positive for cannabinoid Found to have hemoperitoneum with bleeding fibroid s/p laparotomy and myomectomy on 7/2.  Received total of 6 units of PRBC and 2 units of fresh frozen plasma.  Lactic acidosis resolved and hemoglobin stabilized.  Patient was extubated on 7/3, remained stable now on room air so care was transitioned to TRH.  7/4: Vital stable.  Labs with potassium of 3.1 and magnesium  of 1.7-replating electrolytes.  Worsening leukocytosis 17.1, hemoglobin stable at 10.5 and platelets of 76. Patient was febrile at 100.2 yesterday with worsening leukocytosis so starting on Zosyn . Prior urine and blood cultures were negative. Pain uncontrolled on morphine  so she was switched to Dilaudid  PCA by general surgery.  Still no BM or passing gas, significant discharge from NG tube.  7/5: Vitals stable, continue to have significant pain.  Hypomagnesemia at 1.6 send mild hypophosphatemia-electrolytes are being repleted.  Slight worsening of metabolic acidosis, improving leukocytosis. Decreasing secretions from NG tube but no bowel function yet.  7/6: Pain little better today, remained on Dilaudid  PCA.  Still no bowel movement.  Foley catheter was replaced by urology yesterday after multiple failed attempts.  Likely developed ileus and urinary retention with significant pain  medications postoperatively.  Urology to keep Foley for 5 to 10 days, she will need voiding trial which can be done by urology as outpatient.  Still no bowel function but improving NG tube secretions.  7/7: Patient continued to improve, PCA pump was discontinued, had a bowel movement so starting on diet.  Foley to remain in for another week with outpatient urology follow-up.  If remains stable likely can be discharged home tomorrow.  Assessment and Plan: * Hemorrhagic shock from ruptured fibroid s/p myomectomy 03/17/2024 Initially required intubation.  Received multiple blood transfusions and fresh frozen plasma.  S/p exploratory laparotomy and myomectomy.  Hemoglobin now stable. Extubated on 7/3 and now on room air. No bowel function yet and uncontrolled pain. Worsening leukocytosis and becoming febrile on 7/4 so started on Zosyn . Improving leukocytosis  and remained afebrile -Continue with Zosyn -will complete 5 days - Continue with supportive care and IV fluid    Abdominal pain Postoperative ileus-resolved S/p exploratory laparotomy and myomectomy.  Pain remained uncontrolled on morphine . Patient had a bowel movement and now tolerating diet Dilaudid  PCA has been discontinued -Continue with p.o. pain management  Encephalopathy acute-resolved as of 03/20/2024 Mentation improved  Acidosis-resolved as of 03/20/2024 Patient with significant lactic acidosis secondary to hemoperitoneum. Has been resolved  AKI (acute kidney injury) (HCC) Resolved. - Continue to monitor renal function  Hyperglycemia Patient with significant hyperglycemia on admission and was placed on SSI.  No prior diagnosis of diabetes.  A1c of 4.8 No more hyperglycemia  noted -Discontinue SSI  Hypothyroidism due to Hashimoto's thyroiditis Patient with prior history of Hashimoto's thyroiditis and not on any supplement anymore. TSH elevated. - Outpatient follow-up  and repeat TSH once improved from acute  illness  Hypokalemia Potassium at 3.1 today - Continue to monitor and replete as needed  Hypomagnesemia Mild hypophosphatemia. Electrolytes improving -Continue to monitor and replete as needed  Acute urinary retention s/p Foley 03/20/2024 Patient was unable to void yesterday, developed acute urinary retention likely with significant pain medications.  Foley catheter was placed by urology after multiple bedside failed attempts due to difficult anatomy. - Foley to remain in for 5 to 10 days per urology -Voiding trial can be done as outpatient at urology office   Subjective: Patient was sitting comfortably in chair when seen today, NG tube has been removed and she was feeling much improved.  Pain seems well-controlled.  Tolerating diet and had a bowel movement.  Physical Exam: Vitals:   03/22/24 0521 03/22/24 0851 03/22/24 1029 03/22/24 1338  BP: 129/89  124/83 (!) 133/90  Pulse: 91  94 91  Resp: 15 16 17 16   Temp: 98 F (36.7 C)  98.4 F (36.9 C) 98.1 F (36.7 C)  TempSrc: Oral  Oral Oral  SpO2: 97%  99% 100%  Weight:      Height:       General.  Well-developed lady, in no acute distress. Pulmonary.  Lungs clear bilaterally, normal respiratory effort. CV.  Regular rate and rhythm, no JVD, rub or murmur. Abdomen.  Soft, nontender, nondistended, BS positive. CNS.  Alert and oriented .  No focal neurologic deficit. Extremities.  No edema, pulses intact and symmetrical. Psychiatry.  Judgment and insight appears normal.    Data Reviewed: Prior data reviewed  Family Communication: Discussed with husband and other family members at bedside  Disposition: Status is: Inpatient Remains inpatient appropriate because: Severity of illness  Planned Discharge Destination: Home  Time spent: 45 minutes  This record has been created using Conservation officer, historic buildings. Errors have been sought and corrected,but may not always be located. Such creation errors do not reflect on the  standard of care.   Author: Amaryllis Dare, MD 03/22/2024 4:00 PM  For on call review [email or web address].

## 2024-03-22 NOTE — Progress Notes (Signed)
 5 Days Post-Op   Subjective/Chief Complaint: Pain control improving Tolerating NG and full liquids Had BM this morning   Objective: Vital signs in last 24 hours: Temp:  [97.7 F (36.5 C)-99.1 F (37.3 C)] 98 F (36.7 C) (07/07 0521) Pulse Rate:  [77-110] 91 (07/07 0521) Resp:  [14-18] 15 (07/07 0521) BP: (118-138)/(78-97) 129/89 (07/07 0521) SpO2:  [97 %-100 %] 97 % (07/07 0521) Weight:  [67.2 kg] 67.2 kg (07/07 0500) Last BM Date : 03/20/24 (very small)  Intake/Output from previous day: 07/06 0701 - 07/07 0700 In: 2083 [P.O.:1020; I.V.:273.2; IV Piggyback:789.8] Out: 2675 [Urine:2450; Emesis/NG output:225] Intake/Output this shift: No intake/output data recorded.  Exam: Up in a chair Awake and alert Looks comfortable Abdomen soft, incision clean  Lab Results:  Recent Labs    03/20/24 0847 03/21/24 0435 03/22/24 0300  WBC 13.2*  --  8.2  HGB 11.5* 10.2* 10.7*  HCT 34.6*  --  32.3*  PLT 112*  --  195   BMET Recent Labs    03/20/24 0847 03/21/24 0435 03/22/24 0300  NA 135  --  133*  K 3.6 3.1* 3.1*  CL 104  --  101  CO2 17*  --  22  GLUCOSE 87  --  114*  BUN <5*  --  <5*  CREATININE 0.64 0.45 0.68  CALCIUM  8.3*  --  7.9*   PT/INR No results for input(s): LABPROT, INR in the last 72 hours. ABG No results for input(s): PHART, HCO3 in the last 72 hours.  Invalid input(s): PCO2, PO2  Studies/Results: No results found.  Anti-infectives: Anti-infectives (From admission, onward)    Start     Dose/Rate Route Frequency Ordered Stop   03/19/24 1000  piperacillin -tazobactam (ZOSYN ) IVPB 3.375 g        3.375 g 12.5 mL/hr over 240 Minutes Intravenous Every 8 hours 03/19/24 0854     03/18/24 0000  vancomycin  (VANCOCIN ) IVPB 1000 mg/200 mL premix  Status:  Discontinued        1,000 mg 200 mL/hr over 60 Minutes Intravenous Every 24 hours 03/17/24 1649 03/18/24 1055   03/17/24 2200  ceFEPIme  (MAXIPIME ) 2 g in sodium chloride  0.9 % 100 mL IVPB   Status:  Discontinued        2 g 200 mL/hr over 30 Minutes Intravenous 2 times daily 03/17/24 1621 03/18/24 1055   03/17/24 2200  metroNIDAZOLE  (FLAGYL ) IVPB 500 mg  Status:  Discontinued        500 mg 100 mL/hr over 60 Minutes Intravenous 2 times daily 03/17/24 1621 03/18/24 1055   03/17/24 1315  vancomycin  (VANCOCIN ) IVPB 1000 mg/200 mL premix        1,000 mg 200 mL/hr over 60 Minutes Intravenous  Once 03/17/24 1314 03/17/24 1429   03/17/24 1315  piperacillin -tazobactam (ZOSYN ) IVPB 3.375 g        3.375 g 100 mL/hr over 30 Minutes Intravenous  Once 03/17/24 1314 03/17/24 1400       Assessment/Plan: s/p Procedure(s): LAPAROTOMY, EXPLORATORY, myomectomy (N/A)   -HGB stable -having bowel function and tolerating po -will d/c PCA -ambulate -try a soft diet -continue foley cath per urology recommendations  LOS: 5 days    [name] 03/22/2024

## 2024-03-22 NOTE — Evaluation (Signed)
 Occupational Therapy Evaluation Patient Details Name: [name] MRN: [id number] DOB: 12/13/1990 Today's Date: 03/22/2024   History of Present Illness   33 yo female presents to therapy following hospitalization on 03/17/2024 due to severe lower abdominal pain and agitation. Pt was found to have hemorrhagic shock attributed to ruptured fibroid and is now s/p myomectomy and exploratory lap on 7/2. Pt hospitalization completed due to abn labs, agitation and sock requiring intubation, pt extubated on 7/3 and postop ileus with NG tube placement. Pt required multiple blood transfusions. 03/22/24: NGT discontinued. Pt PMH includes but is not limited to: Hashimoto's thyroiditis, schizoaffective disorder and uterine fibroids.     Clinical Impressions Patient evaluated by Occupational Therapy with no further acute OT needs identified. All education has been completed and the patient as well as spouse have no further questions.  See below for any follow-up Occupational Therapy or equipment needs. OT is signing off. Thank you for this referral.      If plan is discharge home, recommend the following:   Assistance with cooking/housework     Functional Status Assessment   Patient has had a recent decline in their functional status and demonstrates the ability to make significant improvements in function in a reasonable and predictable amount of time.     Equipment Recommendations   None recommended by OT     Recommendations for Other Services         Precautions/Restrictions   Precautions Precautions: Fall Restrictions Weight Bearing Restrictions Per Provider Order: No Other Position/Activity Restrictions: No lifting over 15lbs per surgeon in roomon 03/22/24.     Mobility Bed Mobility               General bed mobility comments: Pt received in recliner.    Transfers Overall transfer level: Modified independent (Pt stood from recliner Mod Independent. Pt ambulated in  hallway with supervision for lines.)                        Balance Overall balance assessment: Modified Independent                                         ADL either performed or assessed with clinical judgement   ADL                                         General ADL Comments: Pt performing her basic ADLs at or close to her baseline, but affected by Foley pain, incision pain, and multiple lines. Pt educated on use of long handled equipmennt for ease and to minimze trunk flexion and increased incision pain. Pt's spouse ed on placing most commonly used items on kitchen countertop for ease and reinforced surgeon's restriction of 15# lift limit. Otherwise, pt able to demonstrate UE and LE ADLs as well as ambulation with assisatnce for line mangement only.     Vision Baseline Vision/History: 1 Wears glasses Ability to See in Adequate Light: 0 Adequate Patient Visual Report: No change from baseline Vision Assessment?: No apparent visual deficits     Perception         Praxis         Pertinent Vitals/Pain Pain Assessment Pain Assessment: 0-10 Pain Score: 3  Pain Location: abdomen with cough. Currently foley  is irritating pt. Pain Intervention(s): Monitored during session, Limited activity within patient's tolerance, Premedicated before session     Extremity/Trunk Assessment Upper Extremity Assessment Upper Extremity Assessment: Overall WFL for tasks assessed   Lower Extremity Assessment Lower Extremity Assessment: Generalized weakness   Cervical / Trunk Assessment Cervical / Trunk Assessment: Other exceptions Cervical / Trunk Exceptions: Pt reports some small pressure sores due to hospital days when bedbound. Surgeon in room and made aware   Communication Communication Communication: No apparent difficulties   Cognition Arousal: Alert Behavior During Therapy: WFL for tasks assessed/performed Cognition: No apparent  impairments                               Following commands: Intact       Cueing  General Comments          Exercises     Shoulder Instructions      Home Living Family/patient expects to be discharged to:: Private residence Living Arrangements: Spouse/significant other Available Help at Discharge: Family Type of Home: Apartment Home Access: Stairs to enter Secretary/administrator of Steps: flight Entrance Stairs-Rails: Left;Right Home Layout: One level               Home Equipment: None          Prior Functioning/Environment Prior Level of Function : Independent/Modified Independent;Working/employed             Mobility Comments: IND no AD for all ADLs, self care tasks and IADLs ADLs Comments: Works full time with standing 8 hours per shift.    OT Problem List: Pain   OT Treatment/Interventions:        OT Goals(Current goals can be found in the care plan section)   Acute Rehab OT Goals OT Goal Formulation: All assessment and education complete, DC therapy Potential to Achieve Goals: Good ADL Goals Additional ADL Goal #1: Pt/CG will verbalize understanding re: IADLs with 15# lift limit, compensatory strategies for completing ADLs in order to avoid increased pain.   OT Frequency:       Co-evaluation              AM-PAC OT 6 Clicks Daily Activity     Outcome Measure Help from another person eating meals?: None Help from another person taking care of personal grooming?: None Help from another person toileting, which includes using toliet, bedpan, or urinal?: A Little Help from another person bathing (including washing, rinsing, drying)?: A Little Help from another person to put on and taking off regular upper body clothing?: None Help from another person to put on and taking off regular lower body clothing?: A Little 6 Click Score: 21   End of Session Equipment Utilized During Treatment:  (None) Nurse Communication:  Other (comment) (Coordinated visit with pain management via S.Chat with RN)  Activity Tolerance: Patient tolerated treatment well;Patient limited by pain Patient left: in chair;with call bell/phone within reach;with family/visitor present  OT Visit Diagnosis: Pain Pain - part of body:  (Foley site and abdominal incision)                Time: 9158-9092 OT Time Calculation (min): 26 min Charges:  OT General Charges $OT Visit: 1 Visit OT Evaluation $OT Eval Low Complexity: 1 Low OT Treatments $Self Care/Home Management : 8-22 mins  Delon, OT Acute Rehab Services Office: [phone] 03/22/2024   Delon Falter 03/22/2024, 9:17 AM

## 2024-03-22 NOTE — Progress Notes (Signed)
 Physical Therapy Treatment Patient Details Name: [name] MRN: [id number] DOB: 10-Sep-1991 Today's Date: 03/22/2024   History of Present Illness 33 yo female presents to therapy following hospitalization on 03/17/2024 due to severe lower abdominal pain and agitation. Pt was found to have hemorrhagic shock attributed to ruptured fibroid and is now s/p myomectomy and exploratory lap on 7/2. Pt hospitalization completed due to abn labs, agitation and sock requiring intubation, pt extubated on 7/3 and postop ileus with NG tube placement. Pt required multiple blood transfusions. 03/22/24: NGT discontinued. Pt PMH includes but is not limited to: Hashimoto's thyroiditis, schizoaffective disorder and uterine fibroids.    PT Comments  Pt feeling and performing much better.  PT - Cognition Comments: AxO x 4 pleasant and motivated.  Works Full Time for [employer]) Lives with Spouse and 2 cats. Pt amb self around her room straightening up.  Amb in hallway.  General Gait Details: This is my third walk today tolerated a full length of hallway pushing her own IV pole.  Pain much improved today and feeling better.  Required NO physical asisstance and demonstartes good safety awareness/cognition. Pt has exceeded her PT goals.  Pt declines need for a walker and HH PT.  Will update/consult LPT goals have been met and Pt self amb in her room and hallway. D/C from Acute PT services.  Pt aware and agrees.    If plan is discharge home, recommend the following: Assist for transportation   Can travel by private vehicle        Equipment Recommendations  None recommended by PT    Recommendations for Other Services       Precautions / Restrictions Precautions Precaution/Restrictions Comments: recent ABD surgery Restrictions Weight Bearing Restrictions Per Provider Order: No Other Position/Activity Restrictions: No lifting over 15lbs per surgeon in roomon 03/22/24.     Mobility  Bed  Mobility               General bed mobility comments: OOB in recliner    Transfers Overall transfer level: Modified independent                 General transfer comment: self able and steady    Ambulation/Gait Ambulation/Gait assistance: Modified independent (Device/Increase time) Gait Distance (Feet): 175 Feet Assistive device: IV Pole Gait Pattern/deviations: Step-through pattern Gait velocity: decreased but functional     General Gait Details: This is my third walk today tolerated a full length of hallway pushing her own IV pole.  Pain much improved today and feeling better.  Required NO physical asisstance and demonstartes good safety awareness/cognition.   Stairs             Wheelchair Mobility     Tilt Bed    Modified Rankin (Stroke Patients Only)       Balance                                            Communication Communication Communication: No apparent difficulties  Cognition Arousal: Alert Behavior During Therapy: WFL for tasks assessed/performed   PT - Cognitive impairments: No apparent impairments                       PT - Cognition Comments: AxO x 4 pleasant and motivated.  Works Full Time for [employer]) Lives with Spouse and  2 cats. Following commands: Intact      Cueing Cueing Techniques: Verbal cues  Exercises      General Comments        Pertinent Vitals/Pain Pain Assessment Pain Assessment: Faces Faces Pain Scale: Hurts a little bit Pain Location: ABD much better Pain Descriptors / Indicators: Cramping Pain Intervention(s): Monitored during session, Premedicated before session    Home Living Family/patient expects to be discharged to:: Private residence Living Arrangements: Spouse/significant other Available Help at Discharge: Family Type of Home: Apartment Home Access: Stairs to enter Entrance Stairs-Rails: Lawyer of Steps:  flight   Home Layout: One level Home Equipment: None      Prior Function            PT Goals (current goals can now be found in the care plan section) Progress towards PT goals: Progressing toward goals;Goals met/education completed, patient discharged from PT    Frequency    Min 3X/week      PT Plan      Co-evaluation              AM-PAC PT 6 Clicks Mobility   Outcome Measure  Help needed turning from your back to your side while in a flat bed without using bedrails?: None Help needed moving from lying on your back to sitting on the side of a flat bed without using bedrails?: None Help needed moving to and from a bed to a chair (including a wheelchair)?: None Help needed standing up from a chair using your arms (e.g., wheelchair or bedside chair)?: None Help needed to walk in hospital room?: None Help needed climbing 3-5 steps with a railing? : None 6 Click Score: 24    End of Session Equipment Utilized During Treatment: Gait belt Activity Tolerance: Patient tolerated treatment well Patient left: in chair;with call bell/phone within reach Nurse Communication: Mobility status PT Visit Diagnosis: Unsteadiness on feet (R26.81);Muscle weakness (generalized) (M62.81);Difficulty in walking, not elsewhere classified (R26.2);Pain     Time: 8941-8887 PT Time Calculation (min) (ACUTE ONLY): 14 min  Charges:    $Gait Training: 8-22 mins PT General Charges $$ ACUTE PT VISIT: 1 Visit                      Katheryn Leap  PTA Acute  Rehabilitation Services Office M-F          [phone]

## 2024-03-23 LAB — BASIC METABOLIC PANEL WITH GFR
Anion gap: 11 (ref 5–15)
BUN: <5 mg/dL — ABNORMAL LOW (ref 6–20)
CO2: 21 mmol/L — ABNORMAL LOW (ref 22–32)
Calcium: 8.8 mg/dL — ABNORMAL LOW (ref 8.9–10.3)
Chloride: 103 mmol/L (ref 98–111)
Creatinine, Ser: 0.45 mg/dL (ref 0.44–1.00)
GFR, Estimated: >60 mL/min (ref >60)
Glucose, Bld: 98 mg/dL (ref 70–99)
Potassium: 3.5 mmol/L (ref 3.5–5.1)
Sodium: 135 mmol/L (ref 135–145)

## 2024-03-23 LAB — GLUCOSE, CAPILLARY
Glucose-Capillary: 100 mg/dL — ABNORMAL HIGH (ref 70–99)
Glucose-Capillary: 136 mg/dL — ABNORMAL HIGH (ref 70–99)
Glucose-Capillary: 92 mg/dL (ref 70–99)
Glucose-Capillary: 92 mg/dL (ref 70–99)
Glucose-Capillary: 97 mg/dL (ref 70–99)
Glucose-Capillary: 98 mg/dL (ref 70–99)
Glucose-Capillary: 99 mg/dL (ref 70–99)

## 2024-03-23 LAB — HEMOGLOBIN: Hemoglobin: 11 g/dL — ABNORMAL LOW (ref 12.0–15.0)

## 2024-03-23 MED ORDER — HYDROMORPHONE HCL 1 MG/ML IJ SOLN: 0.5000 mg | INTRAMUSCULAR | Status: DC | PRN

## 2024-03-23 MED ORDER — METHOCARBAMOL 500 MG PO TABS
1000.0000 mg | ORAL_TABLET | Freq: Four times a day (QID) | ORAL | Status: DC
  Administered 2024-03-23 – 2024-03-24 (×5): 1000 mg via ORAL
  Filled 2024-03-23 (×5): qty 2

## 2024-03-23 MED ORDER — ACETAMINOPHEN 500 MG PO TABS
1000.0000 mg | ORAL_TABLET | Freq: Four times a day (QID) | ORAL | Status: DC
  Administered 2024-03-23 – 2024-03-24 (×5): 1000 mg via ORAL
  Filled 2024-03-23 (×5): qty 2

## 2024-03-23 NOTE — Plan of Care (Signed)
  Problem: Safety: Goal: Violent Restraint(s) Outcome: Progressing   Problem: Education: Goal: Ability to describe self-care measures that may prevent or decrease complications (Diabetes Survival Skills Education) will improve Outcome: Progressing Goal: Individualized Educational Video(s) Outcome: Progressing   Problem: Coping: Goal: Ability to adjust to condition or change in health will improve Outcome: Progressing   Problem: Fluid Volume: Goal: Ability to maintain a balanced intake and output will improve Outcome: Progressing   Problem: Health Behavior/Discharge Planning: Goal: Ability to identify and utilize available resources and services will improve Outcome: Progressing Goal: Ability to manage health-related needs will improve Outcome: Progressing   Problem: Metabolic: Goal: Ability to maintain appropriate glucose levels will improve Outcome: Progressing   Problem: Nutritional: Goal: Maintenance of adequate nutrition will improve Outcome: Progressing Goal: Progress toward achieving an optimal weight will improve Outcome: Progressing   Problem: Skin Integrity: Goal: Risk for impaired skin integrity will decrease Outcome: Progressing   Problem: Tissue Perfusion: Goal: Adequacy of tissue perfusion will improve Outcome: Progressing   Problem: Education: Goal: Ability to describe self-care measures that may prevent or decrease complications (Diabetes Survival Skills Education) will improve Outcome: Progressing Goal: Individualized Educational Video(s) Outcome: Progressing   Problem: Cardiac: Goal: Ability to maintain an adequate cardiac output will improve Outcome: Progressing   Problem: Health Behavior/Discharge Planning: Goal: Ability to identify and utilize available resources and services will improve Outcome: Progressing Goal: Ability to manage health-related needs will improve Outcome: Progressing   Problem: Fluid Volume: Goal: Ability to achieve a  balanced intake and output will improve Outcome: Progressing   Problem: Metabolic: Goal: Ability to maintain appropriate glucose levels will improve Outcome: Progressing   Problem: Nutritional: Goal: Maintenance of adequate nutrition will improve Outcome: Progressing Goal: Maintenance of adequate weight for body size and type will improve Outcome: Progressing   Problem: Respiratory: Goal: Will regain and/or maintain adequate ventilation Outcome: Progressing   Problem: Urinary Elimination: Goal: Ability to achieve and maintain adequate renal perfusion and functioning will improve Outcome: Progressing   Problem: Education: Goal: Knowledge of General Education information will improve Description: Including pain rating scale, medication(s)/side effects and non-pharmacologic comfort measures Outcome: Progressing   Problem: Health Behavior/Discharge Planning: Goal: Ability to manage health-related needs will improve Outcome: Progressing   Problem: Clinical Measurements: Goal: Ability to maintain clinical measurements within normal limits will improve Outcome: Progressing Goal: Will remain free from infection Outcome: Progressing Goal: Diagnostic test results will improve Outcome: Progressing Goal: Respiratory complications will improve Outcome: Progressing Goal: Cardiovascular complication will be avoided Outcome: Progressing   Problem: Activity: Goal: Risk for activity intolerance will decrease Outcome: Progressing   Problem: Nutrition: Goal: Adequate nutrition will be maintained Outcome: Progressing   Problem: Coping: Goal: Level of anxiety will decrease Outcome: Progressing   Problem: Elimination: Goal: Will not experience complications related to bowel motility Outcome: Progressing Goal: Will not experience complications related to urinary retention Outcome: Progressing   Problem: Pain Managment: Goal: General experience of comfort will improve and/or be  controlled Outcome: Progressing   Problem: Safety: Goal: Ability to remain free from injury will improve Outcome: Progressing   Problem: Skin Integrity: Goal: Risk for impaired skin integrity will decrease Outcome: Progressing

## 2024-03-23 NOTE — Progress Notes (Signed)
 Progress Note   Patient: [name] FMW:[id number] DOB: Apr 10, 1991 DOA: 03/17/2024     6 DOS: the patient was seen and examined on 03/23/2024   Brief hospital course: From prior notes.  [name] is a 33 y.o. F with PMH significant for Hashimoto's thyroiditis off medication for several years, schizoaffective disorder and uterine fibroids who presented to the ED with severe lower abdominal pain and agitation.   She was later intubated in the ED to protect airway due to extensive agitation and for concern of hemorrhagic shock.  Labs on presentation was with lactic acid more than 15, leukocytosis at 19K, glucose 367.  UDS was positive for cannabinoid Found to have hemoperitoneum with bleeding fibroid s/p laparotomy and myomectomy on 7/2.  Received total of 6 units of PRBC and 2 units of fresh frozen plasma.  Lactic acidosis resolved and hemoglobin stabilized.  Patient was extubated on 7/3, remained stable now on room air so care was transitioned to TRH.  7/4: Vital stable.  Labs with potassium of 3.1 and magnesium  of 1.7-replating electrolytes.  Worsening leukocytosis 17.1, hemoglobin stable at 10.5 and platelets of 76. Patient was febrile at 100.2 yesterday with worsening leukocytosis so starting on Zosyn . Prior urine and blood cultures were negative. Pain uncontrolled on morphine  so she was switched to Dilaudid  PCA by general surgery.  Still no BM or passing gas, significant discharge from NG tube.  7/5: Vitals stable, continue to have significant pain.  Hypomagnesemia at 1.6 send mild hypophosphatemia-electrolytes are being repleted.  Slight worsening of metabolic acidosis, improving leukocytosis. Decreasing secretions from NG tube but no bowel function yet.  7/6: Pain little better today, remained on Dilaudid  PCA.  Still no bowel movement.  Foley catheter was replaced by urology yesterday after multiple failed attempts.  Likely developed ileus and urinary retention with significant pain  medications postoperatively.  Urology to keep Foley for 5 to 10 days, she will need voiding trial which can be done by urology as outpatient.  Still no bowel function but improving NG tube secretions.  7/7: Patient continued to improve, PCA pump was discontinued, had a bowel movement so starting on diet.  Foley to remain in for another week with outpatient urology follow-up.  If remains stable likely can be discharged home tomorrow.  7/8: Hemodynamically stable, hemoglobin at 11 today.  Patient with quite a bit of pain and cramping overnight and unable to sleep well.  Surgery would like to keep her for another day. Urology was also consulted as patient wants to remove Foley before discharge, they advised to remove today at midnight to give her a voiding trial, they will see her tomorrow morning.  Assessment and Plan: * Hemorrhagic shock from ruptured fibroid s/p myomectomy 03/17/2024 Initially required intubation.  Received multiple blood transfusions and fresh frozen plasma.  S/p exploratory laparotomy and myomectomy.  Hemoglobin now stable. Extubated on 7/3 and now on room air. Worsening leukocytosis and becoming febrile on 7/4 so started on Zosyn -will complete a 5-day course today Improving leukocytosis  and remained afebrile -Continue with Zosyn -will complete 5 days - Continue with supportive care and pain management   Abdominal pain Postoperative ileus-resolved S/p exploratory laparotomy and myomectomy.  Pain remained uncontrolled on morphine . Patient had a bowel movement and now tolerating diet Dilaudid  PCA has been discontinued -Continue with p.o. pain management - Try supporting care with abdominal binder and heating pad  Encephalopathy acute-resolved as of 03/20/2024 Mentation improved  Acidosis-resolved as of 03/20/2024 Patient with significant lactic acidosis secondary  to hemoperitoneum. Has been resolved  AKI (acute kidney injury) (HCC) Resolved. - Continue to monitor renal  function  Hyperglycemia Patient with significant hyperglycemia on admission and was placed on SSI.  No prior diagnosis of diabetes.  A1c of 4.8 No more hyperglycemia  noted -Discontinue SSI  Hypothyroidism due to Hashimoto's thyroiditis Patient with prior history of Hashimoto's thyroiditis and not on any supplement anymore. TSH elevated. - Outpatient follow-up and repeat TSH once improved from acute illness  Hypokalemia Potassium at 3.1 today - Continue to monitor and replete as needed  Hypomagnesemia Mild hypophosphatemia. Electrolytes improving -Continue to monitor and replete as needed  Acute urinary retention s/p Foley 03/20/2024 Patient was unable to void yesterday, developed acute urinary retention likely with significant pain medications.  Foley catheter was placed by urology after multiple bedside failed attempts due to difficult anatomy. - Discussed with urology and we will remove Foley catheter at midnight to give her a voiding trial.   Subjective: Patient with significant intermittent abdominal pain and cramping the whole night, unable to get good night sleep. She also wants Foley to be removed before discharge.  Physical Exam: Vitals:   03/22/24 1838 03/22/24 2006 03/23/24 0423 03/23/24 0453  BP: 113/73 (!) 128/99 (!) 135/108   Pulse: 87 (!) 105 94   Resp: 16 18 20    Temp: 98.3 F (36.8 C) 98.7 F (37.1 C) 99.3 F (37.4 C)   TempSrc: Oral Oral Oral   SpO2: 100% 100% 100%   Weight:    67 kg  Height:       General.  Well-developed lady, in no acute distress. Pulmonary.  Lungs clear bilaterally, normal respiratory effort. CV.  Regular rate and rhythm, no JVD, rub or murmur. Abdomen.  Soft, nontender, nondistended, BS positive. CNS.  Alert and oriented .  No focal neurologic deficit. Extremities.  No edema, no cyanosis, pulses intact and symmetrical. Psychiatry.  Judgment and insight appears normal.   Data Reviewed: Prior data reviewed  Family  Communication: Discussed with husband at bedside  Disposition: Status is: Inpatient Remains inpatient appropriate because: Severity of illness  Planned Discharge Destination: Home  Time spent: 46 minutes  This record has been created using Conservation officer, historic buildings. Errors have been sought and corrected,but may not always be located. Such creation errors do not reflect on the standard of care.   Author: Amaryllis Dare, MD 03/23/2024 8:58 AM  For on call review [email or web address].

## 2024-03-23 NOTE — Progress Notes (Signed)
 6 Days Post-Op  Subjective: Doing well today.  Pain better controlled without PCA.  Tolerating soft diet.  Moving her bowels.  Ambulating with husband.    ROS: See above, otherwise other systems negative  Objective: Vital signs in last 24 hours: Temp:  [98.1 F (36.7 C)-99.3 F (37.4 C)] 99.3 F (37.4 C) (07/08 0423) Pulse Rate:  [87-105] 94 (07/08 0423) Resp:  [16-20] 20 (07/08 0423) BP: (113-135)/(73-108) 135/108 (07/08 0423) SpO2:  [99 %-100 %] 100 % (07/08 0423) Weight:  [67 kg] 67 kg (07/08 0453) Last BM Date : 03/22/24  Intake/Output from previous day: 07/07 0701 - 07/08 0700 In: 1500.5 [P.O.:1260; IV Piggyback:240.5] Out: 1200 [Urine:1200] Intake/Output this shift: No intake/output data recorded.  PE: Abd: soft, appropriately tender, midline wound is clean with staples present, ND GU: foley in place with clear urine  Lab Results:  Recent Labs    03/22/24 0300 03/23/24 0453  WBC 8.2  --   HGB 10.7* 11.0*  HCT 32.3*  --   PLT 195  --    BMET Recent Labs    03/22/24 0300 03/23/24 0453  NA 133* 135  K 3.1* 3.5  CL 101 103  CO2 22 21*  GLUCOSE 114* 98  BUN <5* <5*  CREATININE 0.68 0.45  CALCIUM  7.9* 8.8*   PT/INR No results for input(s): LABPROT, INR in the last 72 hours. CMP     Component Value Date/Time   NA 135 03/23/2024 0453   K 3.5 03/23/2024 0453   CL 103 03/23/2024 0453   CO2 21 (L) 03/23/2024 0453   GLUCOSE 98 03/23/2024 0453   BUN <5 (L) 03/23/2024 0453   CREATININE 0.45 03/23/2024 0453   CALCIUM  8.8 (L) 03/23/2024 0453   PROT 5.3 (L) 03/17/2024 1239   ALBUMIN 3.5 03/20/2024 0847   AST 24 03/17/2024 1239   ALT 22 03/17/2024 1239   ALKPHOS 44 03/17/2024 1239   BILITOT 0.7 03/17/2024 1239   GFRNONAA >60 03/23/2024 0453   GFRAA >60 01/28/2019 1000   Lipase     Component Value Date/Time   LIPASE 20 08/18/2020 1620       Studies/Results: No results found.  Anti-infectives: Anti-infectives (From admission,  onward)    Start     Dose/Rate Route Frequency Ordered Stop   03/19/24 1000  piperacillin -tazobactam (ZOSYN ) IVPB 3.375 g        3.375 g 12.5 mL/hr over 240 Minutes Intravenous Every 8 hours 03/19/24 0854 03/23/24 2359   03/18/24 0000  vancomycin  (VANCOCIN ) IVPB 1000 mg/200 mL premix  Status:  Discontinued        1,000 mg 200 mL/hr over 60 Minutes Intravenous Every 24 hours 03/17/24 1649 03/18/24 1055   03/17/24 2200  ceFEPIme  (MAXIPIME ) 2 g in sodium chloride  0.9 % 100 mL IVPB  Status:  Discontinued        2 g 200 mL/hr over 30 Minutes Intravenous 2 times daily 03/17/24 1621 03/18/24 1055   03/17/24 2200  metroNIDAZOLE  (FLAGYL ) IVPB 500 mg  Status:  Discontinued        500 mg 100 mL/hr over 60 Minutes Intravenous 2 times daily 03/17/24 1621 03/18/24 1055   03/17/24 1315  vancomycin  (VANCOCIN ) IVPB 1000 mg/200 mL premix        1,000 mg 200 mL/hr over 60 Minutes Intravenous  Once 03/17/24 1314 03/17/24 1429   03/17/24 1315  piperacillin -tazobactam (ZOSYN ) IVPB 3.375 g        3.375 g 100 mL/hr over 30 Minutes  Intravenous  Once 03/17/24 1314 03/17/24 1400        Assessment/Plan POD 6, s/p ex lap with myomectomy by Dr. Debby 7/2 for hemoperitoneum -tolerating soft diet -cont to wean dilaudid , 0.5mg  q 4 hrs for breakthrough only -follow up arranged for patient as hopefully she will be stable for DC within the next day or so pending pain control -keep foley, will need outpatient urology follow up -mobilize, pulm toilet -cont zosyn , #of doses in place  FEN - soft VTE - hgb stable, can start chemical prophylaxis from our standpoint ID - zosyn , total number of doses in place    LOS: 6 days    Burnard FORBES Banter , [facility] 03/23/2024, 8:47 AM Please see Amion for pager number during day hours 7:00am-4:30pm or 7:00am -11:30am on weekends

## 2024-03-24 LAB — GLUCOSE, CAPILLARY
Glucose-Capillary: 100 mg/dL — ABNORMAL HIGH (ref 70–99)
Glucose-Capillary: 109 mg/dL — ABNORMAL HIGH (ref 70–99)

## 2024-03-24 LAB — CBC
HCT: 32.8 % — ABNORMAL LOW (ref 36.0–46.0)
Hemoglobin: 11.2 g/dL — ABNORMAL LOW (ref 12.0–15.0)
MCH: 29 pg (ref 26.0–34.0)
MCHC: 34.1 g/dL (ref 30.0–36.0)
MCV: 85 fL (ref 80.0–100.0)
Platelets: 353 K/uL (ref 150–400)
RBC: 3.86 MIL/uL — ABNORMAL LOW (ref 3.87–5.11)
RDW: 16.3 % — ABNORMAL HIGH (ref 11.5–15.5)
WBC: 9 K/uL (ref 4.0–10.5)
nRBC: 0.3 % — ABNORMAL HIGH (ref 0.0–0.2)

## 2024-03-24 LAB — BASIC METABOLIC PANEL WITH GFR
Anion gap: 9 (ref 5–15)
BUN: 5 mg/dL — ABNORMAL LOW (ref 6–20)
CO2: 22 mmol/L (ref 22–32)
Calcium: 9 mg/dL (ref 8.9–10.3)
Chloride: 105 mmol/L (ref 98–111)
Creatinine, Ser: 0.58 mg/dL (ref 0.44–1.00)
GFR, Estimated: >60 mL/min (ref >60)
Glucose, Bld: 90 mg/dL (ref 70–99)
Potassium: 3.4 mmol/L — ABNORMAL LOW (ref 3.5–5.1)
Sodium: 136 mmol/L (ref 135–145)

## 2024-03-24 MED ORDER — ACETAMINOPHEN 500 MG PO TABS: 500.0000 mg | ORAL_TABLET | Freq: Four times a day (QID) | ORAL | Status: DC | PRN

## 2024-03-24 MED ORDER — POTASSIUM CHLORIDE CRYS ER 20 MEQ PO TBCR
40.0000 meq | EXTENDED_RELEASE_TABLET | Freq: Once | ORAL | Status: AC
  Administered 2024-03-24: 40 meq via ORAL
  Filled 2024-03-24: qty 2

## 2024-03-24 MED ORDER — HYDROCORT-PRAMOXINE (PERIANAL) 2.5-1 % EX CREA: TOPICAL_CREAM | Freq: Three times a day (TID) | CUTANEOUS | 0 refills | Status: DC | PRN

## 2024-03-24 MED ORDER — OXYCODONE HCL 5 MG PO TABS: 5.0000 mg | ORAL_TABLET | Freq: Four times a day (QID) | ORAL | 0 refills | Status: DC | PRN

## 2024-03-24 MED ORDER — DM-GUAIFENESIN ER 30-600 MG PO TB12: 1.0000 | ORAL_TABLET | Freq: Two times a day (BID) | ORAL | 0 refills | Status: DC | PRN

## 2024-03-24 MED ORDER — METHOCARBAMOL 500 MG PO TABS: 500.0000 mg | ORAL_TABLET | Freq: Four times a day (QID) | ORAL | 0 refills | Status: DC | PRN

## 2024-03-24 NOTE — Progress Notes (Addendum)
 S/p emergent abdominal surgery last week. Discharged from the hospital 7/8 and previous narcotic prescription was unable to be filled. This was confirmed with the pharmacy directly as well as via the PDMP database.   Prescription re-sent for robaxin  and oxycodone  for acute post-op pain.  Patient notified by phone.   Almarie Pringle, PA-C General Surgery

## 2024-03-24 NOTE — Progress Notes (Signed)
 7 Days Post-Op  Subjective: Patient is doing well. Pain is controlled well overall, hurts when she sneezes. Tolerating soft diet. Had 2 BMs this AM. Husband at [facility]. Foley removed overnight, voiding well.      ROS: See above, otherwise other systems negative  Objective: Vital signs in last 24 hours: Temp:  [97.9 F (36.6 C)-98.8 F (37.1 C)] 97.9 F (36.6 C) (07/09 0533) Pulse Rate:  [53-108] 92 (07/09 0533) Resp:  [16-18] 18 (07/09 0533) BP: (119-141)/(72-98) 119/98 (07/09 0533) SpO2:  [94 %-100 %] 100 % (07/09 0533) Last BM Date : 03/23/24  Intake/Output from previous day: 07/08 0701 - 07/09 0700 In: 1288.5 [P.O.:1230; IV Piggyback:58.5] Out: 2450 [Urine:2450] Intake/Output this shift: No intake/output data recorded.  PE: Abd: soft, remains appropriately tender, midline wound is clean, dry and intact with staples present, ND GU: foley out   Lab Results:  Recent Labs    03/22/24 0300 03/23/24 0453 03/24/24 0454  WBC 8.2  --  9.0  HGB 10.7* 11.0* 11.2*  HCT 32.3*  --  32.8*  PLT 195  --  353   BMET Recent Labs    03/23/24 0453 03/24/24 0454  NA 135 136  K 3.5 3.4*  CL 103 105  CO2 21* 22  GLUCOSE 98 90  BUN <5* 5*  CREATININE 0.45 0.58  CALCIUM  8.8* 9.0   PT/INR No results for input(s): LABPROT, INR in the last 72 hours. CMP     Component Value Date/Time   NA 136 03/24/2024 0454   K 3.4 (L) 03/24/2024 0454   CL 105 03/24/2024 0454   CO2 22 03/24/2024 0454   GLUCOSE 90 03/24/2024 0454   BUN 5 (L) 03/24/2024 0454   CREATININE 0.58 03/24/2024 0454   CALCIUM  9.0 03/24/2024 0454   PROT 5.3 (L) 03/17/2024 1239   ALBUMIN 3.5 03/20/2024 0847   AST 24 03/17/2024 1239   ALT 22 03/17/2024 1239   ALKPHOS 44 03/17/2024 1239   BILITOT 0.7 03/17/2024 1239   GFRNONAA >60 03/24/2024 0454   GFRAA >60 01/28/2019 1000   Lipase     Component Value Date/Time   LIPASE 20 08/18/2020 1620       Studies/Results: No results  found.  Anti-infectives: Anti-infectives (From admission, onward)    Start     Dose/Rate Route Frequency Ordered Stop   03/19/24 1000  piperacillin -tazobactam (ZOSYN ) IVPB 3.375 g        3.375 g 12.5 mL/hr over 240 Minutes Intravenous Every 8 hours 03/19/24 0854 03/23/24 2118   03/18/24 0000  vancomycin  (VANCOCIN ) IVPB 1000 mg/200 mL premix  Status:  Discontinued        1,000 mg 200 mL/hr over 60 Minutes Intravenous Every 24 hours 03/17/24 1649 03/18/24 1055   03/17/24 2200  ceFEPIme  (MAXIPIME ) 2 g in sodium chloride  0.9 % 100 mL IVPB  Status:  Discontinued        2 g 200 mL/hr over 30 Minutes Intravenous 2 times daily 03/17/24 1621 03/18/24 1055   03/17/24 2200  metroNIDAZOLE  (FLAGYL ) IVPB 500 mg  Status:  Discontinued        500 mg 100 mL/hr over 60 Minutes Intravenous 2 times daily 03/17/24 1621 03/18/24 1055   03/17/24 1315  vancomycin  (VANCOCIN ) IVPB 1000 mg/200 mL premix        1,000 mg 200 mL/hr over 60 Minutes Intravenous  Once 03/17/24 1314 03/17/24 1429   03/17/24 1315  piperacillin -tazobactam (ZOSYN ) IVPB 3.375 g  3.375 g 100 mL/hr over 30 Minutes Intravenous  Once 03/17/24 1314 03/17/24 1400        Assessment/Plan POD 6, s/p ex lap with myomectomy by Dr. Debby 7/2 for hemoperitoneum -tolerating soft diet, will advance to regular.  -cont to wean dilaudid , 0.5mg  q 4 hrs for breakthrough only -follow up arranged for patient as hopefully she will be stable for DC within the next day or so pending pain control. - foley removed last night, TOV,  will need outpatient urology follow up. -mobilize, pulm toilet -cont zosyn , #of doses in place  FEN - regular VTE - hgb stable, can start chemical prophylaxis from our standpoint ID - zosyn , total number of doses in place    LOS: 7 days    [name] , [facility] 03/24/2024, 8:30 AM Please see Amion for pager number during day hours 7:00am-4:30pm or 7:00am -11:30am on weekends

## 2024-03-26 NOTE — Discharge Summary (Incomplete)
 Physician Discharge Summary   Patient: [name] MRN: [id number] DOB: Mar 30, 1991  Admit date:     03/17/2024  Discharge date: 03/24/2024  Discharge Physician: Elgie Butter   PCP: Gladis Elsie BROCKS, PA-C   Recommendations at discharge:  Please follow up with gen surgery as needed.  Please follow up with PCP in one week.   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 F with PMH significant for Hashimoto's thyroiditis off medication for several years, schizoaffective disorder and uterine fibroids who presented to the ED with severe lower abdominal pain and agitation.   She was later intubated in the ED to protect airway due to extensive agitation and for concern of hemorrhagic shock.   Labs on presentation was with lactic acid more than 15, leukocytosis at 19K, glucose 367.  UDS was positive for cannabinoid Found to have hemoperitoneum with bleeding fibroid s/p laparotomy and myomectomy on 7/2.   Received total of 6 units of PRBC and 2 units of fresh frozen plasma.  Lactic acidosis resolved and hemoglobin stabilized.  Patient was extubated on 7/3, remained stable now on room air so care was transitioned to TRH.  Patient completed the course of antibiotics. PCA pump discontinued.     Assessment and Plan: * Hemorrhagic shock from ruptured fibroid s/p myomectomy 03/17/2024 Initially required intubation.   Received multiple blood transfusions and fresh frozen plasma.  S/p exploratory laparotomy and myomectomy.  Hemoglobin now stable. Extubated on 7/3 and now on room air. Worsening leukocytosis and becoming febrile on 7/4 so started on Zosyn -will complete a 5-day course today Improving leukocytosis  and remained afebrile -Continue with Zosyn -will complete 5 days - Continue with supportive care and pain management   Abdominal pain Postoperative ileus-resolved S/p exploratory laparotomy and myomectomy.  Pain remained uncontrolled on morphine . Patient had a bowel movement and now tolerating diet Dilaudid  PCA has been discontinued -Continue with p.o. pain management - Try supporting care with abdominal binder and heating pad  Encephalopathy acute-resolved as of 03/20/2024 Mentation improved  Acidosis-resolved as of 03/20/2024 Patient with significant lactic acidosis secondary to hemoperitoneum. Has been resolved  AKI (acute kidney injury) (HCC) Resolved. - Continue to monitor renal function  Hyperglycemia Patient with significant hyperglycemia on admission and was placed on SSI.  No prior diagnosis of diabetes.  A1c of 4.8 No more hyperglycemia  noted -Discontinue SSI  Hypothyroidism due to Hashimoto's thyroiditis Patient with prior history of Hashimoto's thyroiditis and not on any supplement anymore. TSH elevated. - Outpatient follow-up and repeat TSH once improved from acute illness  Hypokalemia Potassium at 3.1 today - Continue to monitor and replete as needed  Hypomagnesemia Mild hypophosphatemia. Electrolytes improving -Continue to monitor and replete as needed  Acute urinary retention s/p Foley 03/20/2024 Patient was unable to void yesterday, developed acute urinary retention likely with significant pain medications.  Foley catheter was placed by urology after multiple bedside failed attempts due to difficult anatomy. -  Discussed with urology and we will remove Foley catheter  at midnight to give her a voiding trial.      {Tip this will not be part of the note when signed Body mass index is 27.02 kg/m. , ,  (Optional):26781}  {(NOTE) Pain control PDMP Statment (Optional):26782} Consultants: *** Procedures performed: ***  Disposition: {Plan; Disposition:26390} Diet recommendation:  Discharge Diet Orders (From admission, onward)     Start     Ordered   03/24/24 0000  Diet - low sodium heart healthy        03/24/24 1103           {Diet_Plan:26776} DISCHARGE MEDICATION: Allergies as of 03/24/2024   No Known Allergies      Medication List     STOP taking these medications    ibuprofen  200 MG tablet Commonly known as: ADVIL        TAKE these medications    acetaminophen  500 MG tablet Commonly known as: TYLENOL  Take 1 tablet (500 mg total) by mouth every 6 (six) hours as needed for mild pain (pain score 1-3).   dextromethorphan -guaiFENesin  30-600 MG 12hr tablet Commonly known as: MUCINEX  DM Take 1 tablet by mouth 2 (two) times daily as needed for cough.   hydrocortisone -pramoxine 2.5-1 % rectal cream Commonly known as: ANALPRAM -HC Place rectally 3 (three) times daily as needed for anal itching (For vaginal itching).   methocarbamol  500 MG tablet Commonly known as: ROBAXIN  Take 1 tablet (500 mg total) by mouth every 6 (six) hours as needed for muscle spasms.   oxyCODONE  5 MG immediate release tablet Commonly known as: Oxy IR/ROXICODONE  Take 1 tablet (5 mg total) by mouth every 6 (six) hours as needed for breakthrough pain.               Discharge Care Instructions  (From admission, onward)           Start     Ordered   03/24/24 0000  Discharge wound care:       Comments: Discharge wound care as per general surgery.   03/24/24 1103            Follow-up Information     [facility] Follow up on 03/29/2024.   Specialty: General Surgery Why: 11:00am, Arrive 30 minutes prior to your appointment time,  Please bring your insurance card and photo ID.  This is a nurse visit for staple removal. Contact information: [address] [place] [address] [phone]         Debby Hila, MD Follow up on 04/12/2024.   Specialties: General Surgery, Colon and Rectal Surgery Why: 9:30am, Arrive 15 minutes prior to your appointment time, Please bring your insurance card and photo ID Contact information: [address] [place] [address] [phone]         Gladis Elsie BROCKS, PA-C. Schedule an appointment as soon as possible for a visit in 1 week(s).   Specialty: Family Medicine Contact information: [address] [place] [address] [place] [address] [phone]                Discharge Exam: [name]   03/21/24 0500 03/22/24 0500 03/23/24 0453  Weight: 69.8 kg 67.2 kg 67 kg   ***  Condition at discharge: {DC Condition:26389}  The results of significant diagnostics from this hospitalization (including imaging, microbiology, ancillary and laboratory) are listed below for reference.   Imaging Studies: DG Abd 1 View Result Date: 03/17/2024 CLINICAL DATA:  Nasogastric tube placement. EXAM: ABDOMEN - 1 VIEW COMPARISON:  CT earlier today FINDINGS: Tip and side port of the enteric tube below the diaphragm in the stomach. Midline skin staples. IMPRESSION: Tip and side port of the enteric tube below the diaphragm in the stomach. Electronically Signed   By: Andrea Gasman M.D.   On: 03/17/2024 19:59   CT CHEST ABDOMEN PELVIS W CONTRAST Result Date: 03/17/2024 CLINICAL DATA:  Sepsis * Tracking Code: BO * EXAM: CT CHEST, ABDOMEN, AND PELVIS WITH CONTRAST TECHNIQUE: Multidetector CT imaging of the chest, abdomen and pelvis was performed following the standard protocol during bolus administration of intravenous contrast. RADIATION DOSE REDUCTION: This exam was performed according to the departmental dose-optimization program which includes automated  exposure control, adjustment of the mA and/or kV according to patient size and/or use of iterative reconstruction technique. CONTRAST:  OMNIPAQUE  IOHEXOL  300 MG/ML  SOLN COMPARISON:  08/18/2020 FINDINGS: CT CHEST FINDINGS Cardiovascular: No significant vascular findings. Normal heart size. No pericardial effusion. Mediastinum/Nodes: No enlarged mediastinal, hilar, or axillary lymph nodes. Thymic remnant in the anterior mediastinum. Esophagus is distended and fluid-filled with esophagogastric tube, tip and side port below the diaphragm. Lungs/Pleura: Endotracheal intubation, tube tip above the carina. Irregular bibasilar atelectasis or consolidation. No pleural effusion or pneumothorax. Musculoskeletal: No chest wall abnormality. No acute osseous findings. CT ABDOMEN PELVIS FINDINGS Hepatobiliary: No solid liver abnormality is seen. No gallstones, gallbladder wall thickening, or biliary dilatation. Pancreas: Unremarkable. No pancreatic ductal dilatation or surrounding inflammatory changes. Spleen: Normal in size without significant abnormality. Adrenals/Urinary Tract: Adrenal glands are unremarkable. Kidneys are normal, without renal calculi, solid lesion, or hydronephrosis. Bladder is unremarkable. Stomach/Bowel: Stomach is within normal limits. Appendix appears normal. No evidence of bowel wall thickening, distention, or inflammatory changes. Vascular/Lymphatic: Flattened IVC, concerning for hypovolemia. No enlarged abdominal or pelvic lymph nodes. Reproductive: Extremely bulky fibroid uterus, significantly increased in size compared to prior examination, overall dimensions at least 21.5 x 11.5 x 13.8 cm (series 11, image 95, series 3, image 84). Other: No abdominal wall hernia or abnormality. Large volume intermediate attenuation fluid and heterogeneous, layering blood product throughout the abdomen and pelvis. Peritoneal and omental thickening throughout the ventral abdomen (series 3, image 69).  Musculoskeletal: No acute osseous findings. IMPRESSION: 1. Large volume intermediate attenuation fluid and heterogeneous, layering blood product throughout the abdomen and pelvis, consistent with hemoperitoneum although of uncertain source. Trauma is generally by far the most common etiology of abdominal hemorrhage particularly in this demographic however reportedly there is no history of trauma. Spontaneous hemorrhage from uterine fibroids is a rare, although reported complication. 2. Extremely bulky fibroid uterus, significantly increased in size compared to prior examination, overall dimensions at least 21.5 x 11.5 x 13.8 cm and particularly marked enlargement of a fibroid of the uterine fundus. This may reflect a benign fibroid although leiomyosarcoma is not excluded especially given other constellation of findings. 3. Peritoneal and omental thickening throughout the ventral abdomen, of uncertain significance although suspicious for peritoneal malignancy. 4. Irregular bibasilar atelectasis or consolidation, most suggestive of aspiration and/or chronic sequelae thereof given the presence of fluid within the esophagus. 5. Endotracheal and esophagogastric intubation. Findings reported by telephone to Tylene Paris, [place], 2:37 p.m. 03/17/2024 Electronically Signed   By: Marolyn JONETTA Jaksch M.D.   On: 03/17/2024 14:42   CT Head Wo Contrast Result Date: 03/17/2024 CLINICAL DATA:  Provided history: Headache, neuro deficit. Mental status change, unknown cause. EXAM: CT HEAD WITHOUT CONTRAST TECHNIQUE: Contiguous axial images were obtained from  the base of the skull through the vertex without intravenous contrast. RADIATION DOSE REDUCTION: This exam was performed according to the departmental dose-optimization program which includes automated exposure control, adjustment of the mA and/or kV according to patient size and/or use of iterative reconstruction technique. COMPARISON:  Brain MRI 12/27/2010. FINDINGS: Moderately motion  degraded examination. Within this limitation, findings are as follows. Brain: Cerebral volume is normal. The small pituitary lesion demonstrated on the prior brain MRI of 12/27/2010 cannot be reassessed on this non-contrast head CT. No evidence of an intracranial mass elsewhere. There is no acute intracranial hemorrhage. No demarcated cortical infarct. No extra-axial fluid collection. No midline shift. Vascular: No hyperdense vessel. Skull: No calvarial fracture or aggressive osseous lesion. Sinuses/Orbits: No mass or acute finding within the imaged orbits. No significant paranasal sinus disease at the imaged levels. IMPRESSION: 1. Motion degraded examination. 2. Within this limitation, no acute intracranial abnormality is identified. 3. Please note, the small pituitary lesion demonstrated on the prior brain MRI of 12/27/2010 cannot be reassessed on this non-contrast head CT. This would be best reassessed with a pituitary protocol brain MRI (with and without contrast). Electronically Signed   By: Rockey Childs D.O.   On: 03/17/2024 14:35   DG Chest Portable 1 View Result Date: 03/17/2024 CLINICAL DATA:  POST INTUBATION EXAM: PORTABLE CHEST - 1 VIEW COMPARISON:  August 20, 2020 FINDINGS: Well-positioned endotracheal tube terminating in the mid trachea. Esophagogastric tube is well positioned terminating in the stomach. Lower lung volumes. No focal airspace consolidation, pleural effusion, or pneumothorax. No cardiomegaly. IMPRESSION: Well-positioned endotracheal and esophagogastric tubes. No pneumothorax. Electronically Signed   By: Rogelia Myers M.D.   On: 03/17/2024 14:32    Microbiology: Results for orders placed or performed during the hospital encounter of 03/17/24  Blood culture (routine x 2)     Status: None   Collection Time: 03/17/24 12:39 PM   Specimen: BLOOD  Result Value Ref Range Status   Specimen Description   Final    BLOOD RIGHT ANTECUBITAL Performed at [facility],  [address], [place], [place] [address]    Special Requests   Final    BOTTLES DRAWN AEROBIC AND ANAEROBIC Blood Culture results may not be optimal due to an inadequate volume of blood received in culture bottles Performed at [facility], [address] [place], [place] [address]    Culture   Final    NO GROWTH 5 DAYS Performed at [facility], [address], [place], [place] [address]    Report Status 03/22/2024 FINAL  Final  Blood culture (routine x 2)     Status: None   Collection Time: 03/17/24 12:57 PM   Specimen: BLOOD  Result Value Ref Range Status   Specimen Description   Final    BLOOD LEFT ANTECUBITAL Performed at [facility], [address] [place], [place] [address]    Special Requests   Final    BOTTLES DRAWN AEROBIC AND ANAEROBIC Blood Culture results may not be optimal due to an inadequate volume of blood received in culture bottles Performed at [facility], [address], [place], [place] [address]    Culture   Final    NO GROWTH 5 DAYS Performed at [facility], [address], [place], [place] [address]    Report Status 03/22/2024 FINAL  Final  Urine Culture     Status: None   Collection Time: 03/17/24  3:00 PM  Specimen: Urine, Catheterized  Result Value Ref Range Status   Specimen Description   Final    URINE, CATHETERIZED Performed at [facility], [address], [place], [place] [address]    Special Requests   Final    NONE Performed at [facility], [address] [place], [place] [address]    Culture   Final    NO GROWTH Performed at [facility], [address] [place], [place] [address]    Report Status 03/18/2024 FINAL  Final  MRSA Next Gen by PCR, Nasal     Status: None   Collection Time: 03/17/24  8:13 PM   Specimen: Nasal Mucosa; Nasal Swab  Result Value Ref Range Status   MRSA by PCR Next Gen NOT DETECTED NOT  DETECTED Final    Comment: (NOTE) The GeneXpert MRSA Assay (FDA approved for NASAL specimens only), is one component of a comprehensive MRSA colonization surveillance program. It is not intended to diagnose MRSA infection nor to guide or monitor treatment for MRSA infections. Test performance is not FDA approved in patients less than 52 years old. Performed at [facility], [address] [place], [place] [address]     Labs: CBC: Recent Labs  Lab 03/20/24 [id number] 03/21/24 0435 03/22/24 0300 03/23/24 0453 03/24/24 0454  WBC 13.2*  --  8.2  --  9.0  HGB 11.5* 10.2* 10.7* 11.0* 11.2*  HCT 34.6*  --  32.3*  --  32.8*  MCV 87.2  --  87.1  --  85.0  PLT 112*  --  195  --  353   Basic Metabolic Panel: Recent Labs  Lab 03/20/24 0847 03/21/24 0435 03/22/24 0300 03/23/24 0453 03/24/24 0454  NA 135  --  133* 135 136  K 3.6 3.1* 3.1* 3.5 3.4*  CL 104  --  101 103 105  CO2 17*  --  22 21* 22  GLUCOSE 87  --  114* 98 90  BUN <5*  --  <5* <5* 5*  CREATININE 0.64 0.45 0.68 0.45 0.58  CALCIUM  8.3*  --  7.9* 8.8* 9.0  MG 1.6*  --  2.0  --   --   PHOS 2.3* 3.0 2.6  --   --    Liver Function Tests: Recent Labs  Lab 03/20/24 0847  ALBUMIN 3.5   CBG: Recent Labs  Lab 03/23/24 1714 03/23/24 2005 03/23/24 2347 03/24/24 0355 03/24/24 0728  GLUCAP 97 136* 100* 100* 109*    Discharge time spent: {LESS THAN/GREATER UYJW:73611} 30 minutes.  Signed: Elgie Butter, MD [facility] 03/26/2024

## 2024-03-26 NOTE — Discharge Summary (Signed)
 Physician Discharge Summary   Patient: [name] MRN: [id number] DOB: 06-02-1991  Admit date:     03/17/2024  Discharge date: 03/24/2024  Discharge Physician: Elgie Butter   PCP: Gladis Elsie BROCKS, PA-C   Recommendations at discharge:  Please follow up with gen surgery as needed.  Please follow up with PCP in one week.   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 F with PMH significant for Hashimoto's thyroiditis off medication for several years, schizoaffective disorder and uterine fibroids who presented to the ED with severe lower abdominal pain and agitation.   She was later intubated in the ED to protect airway due to extensive agitation and for concern of hemorrhagic shock.   Labs on presentation was with lactic acid more than 15, leukocytosis at 19K, glucose 367.  UDS was positive for cannabinoid Found to have hemoperitoneum with bleeding fibroid s/p laparotomy and myomectomy on 7/2.   Received total of 6 units of PRBC and 2 units of fresh frozen plasma.  Lactic acidosis resolved and hemoglobin stabilized.  Patient was extubated on 7/3, remained stable now on room air so care was transitioned to TR[facility].  Patient completed the course of antibiotics. PCA pump discontinued.     Assessment and Plan:  Hemorrhagic shock from ruptured fibroid s/p myomectomy 03/17/2024 Initially required intubation.   Received multiple blood transfusions and fresh frozen plasma.  S/p exploratory laparotomy and myomectomy.  Hemoglobin now stable. Extubated on 7/3 and now on room air. Completed the course of IV zosyn . Wbc count normalized. - Continue with supportive care and pain management     Abdominal pain Postoperative ileus-resolved S/p exploratory laparotomy and myomectomy.  Pain well controlled with oral meds.    Encephalopathy acute-resolved as of 03/20/2024 Mentation improved   Acidosis-resolved as of 03/20/2024 Patient with significant lactic acidosis secondary to hemoperitoneum. Has been resolved   AKI (acute kidney injury) (HCC) Resolved. - Continue to monitor renal function   Hyperglycemia Patient with significant hyperglycemia on admission and was placed on SSI.  No prior diagnosis of diabetes.  A1c of 4.8 No more hyperglycemia  noted -Discontinue SSI   Hypothyroidism due to Hashimoto's thyroiditis Patient with prior history of Hashimoto's thyroiditis and not on any supplement anymore. TSH elevated. - Outpatient follow-up and repeat TSH once improved from acute illness   Hypokalemia Replaced.    Hypomagnesemia Mild hypophosphatemia. Electrolytes improving -Continue to monitor and replete as needed   Acute urinary retention s/p Foley 03/20/2024 Voiding trial done and pt successfully voided.    Consultants: gen surgery.  Procedures performed: S/p exploratory laparotomy and myomectomy.  Disposition: Home Diet recommendation:  Discharge Diet Orders (From admission, onward)     Start     Ordered   03/24/24 0000  Diet - low sodium heart healthy        03/24/24 1103           Regular diet DISCHARGE MEDICATION: Allergies  as of 03/24/2024   No Known Allergies      Medication List     STOP taking these medications    ibuprofen  200 MG tablet Commonly known as: ADVIL        TAKE these medications    acetaminophen  500 MG tablet Commonly known as: TYLENOL  Take 1  tablet (500 mg total) by mouth every 6 (six) hours as needed for mild pain (pain score 1-3).   dextromethorphan -guaiFENesin  30-600 MG 12hr tablet Commonly known as: MUCINEX  DM Take 1 tablet by mouth 2 (two) times daily as needed for cough.   hydrocortisone -pramoxine 2.5-1 % rectal cream Commonly known as: ANALPRAM -HC Place rectally 3 (three) times daily as needed for anal itching (For vaginal itching).   methocarbamol  500 MG tablet Commonly known as: ROBAXIN  Take 1 tablet (500 mg total) by mouth every 6 (six) hours as needed for muscle spasms.   oxyCODONE  5 MG immediate release tablet Commonly known as: Oxy IR/ROXICODONE  Take 1 tablet (5 mg total) by mouth every 6 (six) hours as needed for breakthrough pain.               Discharge Care Instructions  (From admission, onward)           Start     Ordered   03/24/24 0000  Discharge wound care:       Comments: Discharge wound care as per general surgery.   03/24/24 1103            Follow-up Information     [facility] Follow up on 03/29/2024.   Specialty: General Surgery Why: 11:00am, Arrive 30 minutes prior to your appointment time, Please bring your insurance card and photo ID.  This is a nurse visit for staple removal. Contact information: [address] [place] [address] [phone]         Debby Hila, MD Follow up on 04/12/2024.   Specialties: General Surgery, Colon and Rectal Surgery Why: 9:30am, Arrive 15 minutes prior to your appointment time, Please bring your insurance card and photo ID Contact information: [address] [place] [address] [phone]         Gladis Elsie BROCKS, PA-C. Schedule an appointment as soon as possible for a visit in 1 week(s).   Specialty: Family Medicine Contact information: [address] [place] [address] [place] [address] [phone]                Discharge Exam: [name]   03/21/24 0500  03/22/24 0500 03/23/24 0453  Weight: 69.8 kg 67.2 kg 67 kg   General exam: Appears calm and comfortable  Respiratory system: Clear to auscultation. Respiratory effort normal. Cardiovascular system: S1 & S2 heard, RRR Gastrointestinal system: Abdomen is nondistended, soft and nontender.  Central nervous system: Alert and oriented.  Extremities: Symmetric 5 x 5 power. Skin: No rashes,  Psychiatry:  Mood & affect appropriate.    Condition at discharge: fair  The results of significant diagnostics from this hospitalization (including imaging, microbiology, ancillary and laboratory) are listed below for reference.   Imaging Studies: DG Abd 1 View Result Date: 03/17/2024 CLINICAL DATA:  Nasogastric tube placement. EXAM: ABDOMEN - 1 VIEW COMPARISON:  CT earlier today FINDINGS: Tip and side port of the enteric tube below the diaphragm in the stomach. Midline skin staples. IMPRESSION: Tip and side port of the enteric tube below the diaphragm in the stomach. Electronically Signed   By: Andrea Gasman M.D.   On:  03/17/2024 19:59   CT CHEST ABDOMEN PELVIS W CONTRAST Result Date: 03/17/2024 CLINICAL DATA:  Sepsis * Tracking Code: BO * EXAM: CT CHEST, ABDOMEN, AND PELVIS WITH CONTRAST TECHNIQUE: Multidetector CT imaging of the chest, abdomen and pelvis was performed following the standard protocol during bolus administration of intravenous contrast. RADIATION DOSE REDUCTION: This exam was performed according to the departmental dose-optimization program which includes automated exposure control, adjustment of the mA and/or kV according to patient size and/or use of iterative reconstruction technique. CONTRAST:  OMNIPAQUE  IOHEXOL  300 MG/ML  SOLN COMPARISON:  08/18/2020 FINDINGS: CT CHEST FINDINGS Cardiovascular: No significant vascular findings. Normal heart size. No pericardial effusion. Mediastinum/Nodes: No enlarged mediastinal, hilar, or axillary lymph nodes. Thymic remnant in the anterior  mediastinum. Esophagus is distended and fluid-filled with esophagogastric tube, tip and side port below the diaphragm. Lungs/Pleura: Endotracheal intubation, tube tip above the carina. Irregular bibasilar atelectasis or consolidation. No pleural effusion or pneumothorax. Musculoskeletal: No chest wall abnormality. No acute osseous findings. CT ABDOMEN PELVIS FINDINGS Hepatobiliary: No solid liver abnormality is seen. No gallstones, gallbladder wall thickening, or biliary dilatation. Pancreas: Unremarkable. No pancreatic ductal dilatation or surrounding inflammatory changes. Spleen: Normal in size without significant abnormality. Adrenals/Urinary Tract: Adrenal glands are unremarkable. Kidneys are normal, without renal calculi, solid lesion, or hydronephrosis. Bladder is unremarkable. Stomach/Bowel: Stomach is within normal limits. Appendix appears normal. No evidence of bowel wall thickening, distention, or inflammatory changes. Vascular/Lymphatic: Flattened IVC, concerning for hypovolemia. No enlarged abdominal or pelvic lymph nodes. Reproductive: Extremely bulky fibroid uterus, significantly increased in size compared to prior examination, overall dimensions at least 21.5 x 11.5 x 13.8 cm (series 11, image 95, series 3, image 84). Other: No abdominal wall hernia or abnormality. Large volume intermediate attenuation fluid and heterogeneous, layering blood product throughout the abdomen and pelvis. Peritoneal and omental thickening throughout the ventral abdomen (series 3, image 69). Musculoskeletal: No acute osseous findings. IMPRESSION: 1. Large volume intermediate attenuation fluid and heterogeneous, layering blood product throughout the abdomen and pelvis, consistent with hemoperitoneum although of uncertain source. Trauma is generally by far the most common etiology of abdominal hemorrhage particularly in this demographic however reportedly there is no history of trauma. Spontaneous hemorrhage from uterine  fibroids is a rare, although reported complication. 2. Extremely bulky fibroid uterus, significantly increased in size compared to prior examination, overall dimensions at least 21.5 x 11.5 x 13.8 cm and particularly marked enlargement of a fibroid of the uterine fundus. This may reflect a benign fibroid although leiomyosarcoma is not excluded especially given other constellation of findings. 3. Peritoneal and omental thickening throughout the ventral abdomen, of uncertain significance although suspicious for peritoneal malignancy. 4. Irregular bibasilar atelectasis or consolidation, most suggestive of aspiration and/or chronic sequelae thereof given the presence of fluid within the esophagus. 5. Endotracheal and esophagogastric intubation. Findings reported by telephone to Tylene Paris, [place], 2:37 p.m. 03/17/2024 Electronically Signed   By: Marolyn JONETTA Jaksch M.D.   On: 03/17/2024 14:42   CT Head Wo Contrast Result Date: 03/17/2024 CLINICAL DATA:  Provided history: Headache, neuro deficit. Mental status change, unknown cause. EXAM: CT HEAD WITHOUT CONTRAST TECHNIQUE: Contiguous axial images were obtained from the base of the skull through the vertex without intravenous contrast. RADIATION DOSE REDUCTION: This exam was performed according to the departmental dose-optimization program which includes automated exposure control, adjustment of the mA and/or kV according to patient size and/or use of iterative reconstruction technique. COMPARISON:  Brain MRI 12/27/2010. FINDINGS: Moderately motion degraded examination. Within this limitation,  findings are as follows. Brain: Cerebral volume is normal. The small pituitary lesion demonstrated on the prior brain MRI of 12/27/2010 cannot be reassessed on this non-contrast head CT. No evidence of an intracranial mass elsewhere. There is no acute intracranial hemorrhage. No demarcated cortical infarct. No extra-axial fluid collection. No midline shift. Vascular: No hyperdense  vessel. Skull: No calvarial fracture or aggressive osseous lesion. Sinuses/Orbits: No mass or acute finding within the imaged orbits. No significant paranasal sinus disease at the imaged levels. IMPRESSION: 1. Motion degraded examination. 2. Within this limitation, no acute intracranial abnormality is identified. 3. Please note, the small pituitary lesion demonstrated on the prior brain MRI of 12/27/2010 cannot be reassessed on this non-contrast head CT. This would be best reassessed with a pituitary protocol brain MRI (with and without contrast). Electronically Signed   By: Rockey Childs D.O.   On: 03/17/2024 14:35   DG Chest Portable 1 View Result Date: 03/17/2024 CLINICAL DATA:  POST INTUBATION EXAM: PORTABLE CHEST - 1 VIEW COMPARISON:  August 20, 2020 FINDINGS: Well-positioned endotracheal tube terminating in the mid trachea. Esophagogastric tube is well positioned terminating in the stomach. Lower lung volumes. No focal airspace consolidation, pleural effusion, or pneumothorax. No cardiomegaly. IMPRESSION: Well-positioned endotracheal and esophagogastric tubes. No pneumothorax. Electronically Signed   By: Rogelia Myers M.D.   On: 03/17/2024 14:32    Microbiology: Results for orders placed or performed during the hospital encounter of 03/17/24  Blood culture (routine x 2)     Status: None   Collection Time: 03/17/24 12:39 PM   Specimen: BLOOD  Result Value Ref Range Status   Specimen Description   Final    BLOOD RIGHT ANTECUBITAL Performed at [facility] [address], [place], [place] [address]    Special Requests   Final    BOTTLES DRAWN AEROBIC AND ANAEROBIC Blood Culture results may not be optimal due to an inadequate volume of blood received in culture bottles Performed at [facility], [address] [place], [place] [address]    Culture   Final    NO GROWTH 5 DAYS Performed at [facility], [address] [place], [place] [address]     Report Status 03/22/2024 FINAL  Final  Blood culture (routine x 2)     Status: None   Collection Time: 03/17/24 12:57 PM   Specimen: BLOOD  Result Value Ref Range Status   Specimen Description   Final    BLOOD LEFT ANTECUBITAL Performed at [facility], [address], [name], [place] [address]    Special Requests   Final    BOTTLES DRAWN AEROBIC AND ANAEROBIC Blood Culture results may not be optimal due to an inadequate volume of blood received in culture bottles Performed at [facility], [address], [place], [place] [address]    Culture   Final    NO GROWTH 5 DAYS Performed at [facility], [address], [place], [place] [address]    Report Status 03/22/2024 FINAL  Final  Urine Culture     Status: None   Collection Time: 03/17/24  3:00 PM   Specimen: Urine, Catheterized  Result Value Ref Range Status   Specimen Description   Final    URINE, CATHETERIZED Performed at [facility], [address], [place], [place] [address]    Special Requests   Final    NONE Performed at [facility], [address] [place],  [place] [address]    Culture   Final    NO GROWTH Performed at [facility], [address] [place], [place] [address]    Report Status 03/18/2024 FINAL  Final  MRSA Next Gen by PCR, Nasal     Status: None   Collection Time: 03/17/24  8:13 PM   Specimen: Nasal Mucosa; Nasal Swab  Result Value Ref Range Status   MRSA by PCR Next Gen NOT DETECTED NOT DETECTED Final    Comment: (NOTE) The GeneXpert MRSA Assay (FDA approved for NASAL specimens only), is one component of a comprehensive MRSA colonization surveillance program. It is not intended to diagnose MRSA infection nor to guide or monitor treatment for MRSA infections. Test performance is not FDA approved in patients less than 80 years old. Performed at [facility], [address] [place], [place] [address]     Labs: CBC: Recent Labs  Lab 03/20/24 [phone] 03/21/24 0435 03/22/24 0300 03/23/24 0453 03/24/24 0454  WBC 13.2*  --  8.2  --  9.0  HGB 11.5* 10.2* 10.7* 11.0* 11.2*  HCT 34.6*  --  32.3*  --  32.8*  MCV 87.2  --  87.1  --  85.0  PLT 112*  --  195  --  353   Basic Metabolic Panel: Recent Labs  Lab 03/20/24 0847 03/21/24 0435 03/22/24 0300 03/23/24 0453 03/24/24 0454  NA 135  --  133* 135 136  K 3.6 3.1* 3.1* 3.5 3.4*  CL 104  --  101 103 105  CO2 17*  --  22 21* 22  GLUCOSE 87  --  114* 98 90  BUN <5*  --  <5* <5* 5*  CREATININE 0.64 0.45 0.68 0.45 0.58  CALCIUM  8.3*  --  7.9* 8.8* 9.0  MG 1.6*  --  2.0  --   --   PHOS 2.3* 3.0 2.6  --   --    Liver Function Tests: Recent Labs  Lab 03/20/24 0847  ALBUMIN 3.5   CBG: Recent Labs  Lab 03/23/24 1714 03/23/24 2005 03/23/24 2347 03/24/24 0355 03/24/24 0728  GLUCAP 97 136* 100* 100* 109*    Discharge time spent: 38 minutes.   Signed: Tynisha Ogan, MD [facility]

## 2024-04-05 ENCOUNTER — Telehealth: Payer: Self-pay

## 2024-04-05 NOTE — Telephone Encounter (Signed)
 Att to contact pt to schedule appt w/[facility] no ans lvm

## 2024-04-21 ENCOUNTER — Telehealth: Admitting: Family Medicine

## 2024-04-21 DIAGNOSIS — M545 Low back pain, unspecified: Secondary | ICD-10-CM

## 2024-04-21 NOTE — Progress Notes (Signed)
  Because you recently had surgery and they triggered your symptoms you need to follow up with the surgeons office for to rule out a post surgery issue. We feel your condition warrants further evaluation and  recommend that you be seen in a face-to-face visit.   NOTE: There will be NO CHARGE for this E-Visit   If you are having a true medical emergency, please call 911.

## 2024-04-23 ENCOUNTER — Telehealth: Payer: Self-pay | Admitting: Nurse Practitioner

## 2024-04-23 DIAGNOSIS — N946 Dysmenorrhea, unspecified: Secondary | ICD-10-CM

## 2024-04-23 DIAGNOSIS — R102 Pelvic and perineal pain: Secondary | ICD-10-CM

## 2024-04-23 NOTE — Progress Notes (Signed)
 Virtual Visit Consent   Etha LITTIE Plater, you are scheduled for a virtual visit with a [facility] provider today. Just as with appointments in the office, your consent must be obtained to participate. Your consent will be active for this visit and any virtual visit you may have with one of our providers in the next 365 days. If you have a MyChart account, a copy of this consent can be sent to you electronically.  As this is a virtual visit, video technology does not allow for your provider to perform a traditional examination. This may limit your provider's ability to fully assess your condition. If your provider identifies any concerns that need to be evaluated in person or the need to arrange testing (such as labs, EKG, etc.), we will make arrangements to do so. Although advances in technology are sophisticated, we cannot ensure that it will always work on either your end or our end. If the connection with a video visit is poor, the visit may have to be switched to a telephone visit. With either a video or telephone visit, we are not always able to ensure that we have a secure connection.  By engaging in this virtual visit, you consent to the provision of healthcare and authorize for your insurance to be billed (if applicable) for the services provided during this visit. Depending on your insurance coverage, you may receive a charge related to this service.  I need to obtain your verbal consent now. Are you willing to proceed with your visit today? Fortune L Ruppert has provided verbal consent on 04/23/2024 for a virtual visit (video or telephone). Lauraine Kitty, FNP  Date: 04/23/2024 2:08 PM   Virtual Visit via Video Note   I, Lauraine Kitty, connected with  [name]  ([id number], 21-Jan-1991) on 04/23/24 at  2:15 PM EDT by a video-enabled telemedicine application and verified that I am speaking with the correct person using two identifiers.  Location: Patient: Virtual Visit Location Patient:  Home Provider: Virtual Visit Location Provider: Home Office   I discussed the limitations of evaluation and management by telemedicine and the availability of in person appointments. The patient expressed understanding and agreed to proceed.    History of Present Illness: [name] is a 33 y.o. who identifies as a nonbinary who was assigned female at birth, and is being seen today for concerns over a recent heavy period.   She had open abdominal surgery one month ago to remove a fibroid from her uterus.  Today is her first menstraul cycle since surgery. She does have a history of menstraul cramping prior to surgery, but feels today due to her recent surgical procedure that the cramping feels more intense.   Denies any difficulty going to the bathroom Denies fever   She did need to call into work today due to the cramping  She has been using tylenol  for pain relief    Problems:  Patient Active Problem List   Diagnosis Date Noted   Anxiety 03/21/2024   Acute urinary retention s/p Foley 03/20/2024 03/21/2024   Difficult catheterization 03/21/2024   Schizoaffective disorder (HCC) 03/20/2024   Anemia 03/20/2024   Hypophosphatemia 03/20/2024   Fibroid uterus 03/19/2024   AKI (acute kidney injury) (HCC) 03/19/2024   Hypokalemia 03/19/2024   Hypomagnesemia 03/19/2024   Hemorrhagic shock from ruptured fibroid s/p myomectomy 03/17/2024 03/18/2024   Abdominal pain 03/17/2024   Hyperglycemia 03/17/2024   Hepatomegaly 08/19/2020   Hyperbilirubinemia 08/19/2020   Hyperprolactinemia (HCC)  Amenorrhea, secondary    Hirsutism    Bipolar disorder (HCC)    Hypothyroidism due to Hashimoto's thyroiditis 01/31/2011   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) 01/31/2011    Allergies: No Known Allergies Medications:  Current Outpatient Medications:    acetaminophen  (TYLENOL ) 500 MG tablet, Take 1 tablet (500 mg total) by mouth every 6 (six) hours as needed  for mild pain (pain score 1-3)., Disp: , Rfl:    dextromethorphan -guaiFENesin  (MUCINEX  DM) 30-600 MG 12hr tablet, Take 1 tablet by mouth 2 (two) times daily as needed for cough., Disp: 20 tablet, Rfl: 0   hydrocortisone -pramoxine (ANALPRAM -HC) 2.5-1 % rectal cream, Place rectally 3 (three) times daily as needed for anal itching (For vaginal itching)., Disp: 30 g, Rfl: 0   methocarbamol  (ROBAXIN ) 500 MG tablet, Take 1 tablet (500 mg total) by mouth every 6 (six) hours as needed for muscle spasms., Disp: 28 tablet, Rfl: 0   oxyCODONE  (OXY IR/ROXICODONE ) 5 MG immediate release tablet, Take 1 tablet (5 mg total) by mouth every 6 (six) hours as needed for breakthrough pain., Disp: 15 tablet, Rfl: 0  Observations/Objective: Patient is well-developed, well-nourished in no acute distress.  Resting comfortably  at home.  Head is normocephalic, atraumatic.  No labored breathing.  Speech is clear and coherent with logical content.  Patient is alert and oriented at baseline.    Assessment and Plan:  1. Menstrual cramps (Primary)  Patient feels she is OK using tylenol  for pain control. Discussed strict follow up precautions if bleeding seems atypical for menstraul cycle, if pain worsens, with onset of fever or other new symptoms.   Work note provided as requested for today     Follow Up Instructions: I discussed the assessment and treatment plan with the patient. The patient was provided an opportunity to ask questions and all were answered. The patient agreed with the plan and demonstrated an understanding of the instructions.  A copy of instructions were sent to the patient via MyChart unless otherwise noted below.    The patient was advised to call back or seek an in-person evaluation if the symptoms worsen or if the condition fails to improve as anticipated.    Lauraine Kitty, FNP

## 2024-04-23 NOTE — Progress Notes (Signed)
 Charlie,  Thank you for submitting an e-visit. We do not currently treat menstraul related issues via E-visit. You can choose to contact your primary care provider, your OBGYN or schedule a video visit for further evaluation.   I feel your condition warrants further evaluation and I recommend that you be seen in a face to face visit with your gynecologist or at one of our [facility] clinics.   NOTE: There will be NO CHARGE for this eVisit   If you are having a true medical emergency please call 911.    *Center for [facility] at [facility]             [address], [place], [place] [address] [phone] (*Take patients with no insurance)  *Center for [employer] at [facility] [facility] [address] [place], [place],  [place]  [address] [phone] (*Take patients with no insurance)  Center for [employer] at [employer]                                                             [address], [place], [place], [address] [phone]  Center for [facility] at [facility] [address], [place], [place], [address] [phone]  Center for [facility] at [facility] [address], [place], [place], [address] [phone]  Center for [facility] at [facility]                                 [address] [place], [place], [place], [address] [phone]  Center for [facility] at [facility]                                    [address] [place], [place], [address] [phone]  Center for [facility] at [employer] [address], [place], [place], [address]                              [facility] [place] [address], [place], [place], [address] [phone]  Your MyChart E-visit questionnaire answers were reviewed by a board certified advanced clinical practitioner to complete your personal care plan  based on your specific symptoms.  Thank you for using e-Visits.

## 2024-06-19 ENCOUNTER — Encounter: Admitting: Nurse Practitioner

## 2024-06-19 ENCOUNTER — Telehealth: Payer: Self-pay | Admitting: Nurse Practitioner

## 2024-06-19 ENCOUNTER — Encounter: Payer: Self-pay | Admitting: Nurse Practitioner

## 2024-06-19 DIAGNOSIS — R112 Nausea with vomiting, unspecified: Secondary | ICD-10-CM

## 2024-06-19 MED ORDER — ONDANSETRON HCL 4 MG PO TABS: 4.0000 mg | ORAL_TABLET | Freq: Three times a day (TID) | ORAL | 0 refills | Status: DC | PRN

## 2024-06-19 NOTE — Progress Notes (Signed)
 We are sorry that you are not feeling well. Here is how we plan to help!  Based on what you have shared with me it looks like you have a Virus that is irritating your GI tract.  Vomiting is the forceful emptying of a portion of the stomach's content through the mouth.  Although nausea and vomiting can make you feel miserable, it's important to remember that these are not diseases, but rather symptoms of an underlying illness.  When we treat short term symptoms, we always caution that any symptoms that persist should be fully evaluated in a medical office.  I have prescribed a medication that will help alleviate your symptoms and allow you to stay hydrated:  Zofran  4 mg 1 tablet every 8 hours as needed for nausea and vomiting  HOME CARE: Drink clear liquids.  This is very important! Dehydration (the lack of fluid) can lead to a serious complication.  Start off with 1 tablespoon every 5 minutes for 8 hours. You may begin eating bland foods after 8 hours without vomiting.  Start with saltine crackers, white bread, rice, mashed potatoes, applesauce. After 48 hours on a bland diet, you may resume a normal diet. Try to go to sleep.  Sleep often empties the stomach and relieves the need to vomit.  GET HELP RIGHT AWAY IF:  Your symptoms do not improve or worsen within 2 days after treatment. You have a fever for over 3 days. You cannot keep down fluids after trying the medication.  MAKE SURE YOU:  Understand these instructions. Will watch your condition. Will get help right away if you are not doing well or get worse.   Thank you for choosing an e-visit. Your e-visit answers were reviewed by a board certified advanced clinical practitioner to complete your personal care plan. Depending upon the condition, your plan could have included both over the counter or prescription medications. Please review your pharmacy choice. Be sure that the pharmacy you have chosen is open so that you can pick up  your prescription now.  If there is a problem you may message your provider in MyChart to have the prescription routed to another pharmacy. Your safety is important to us . If you have drug allergies check your prescription carefully.  For the next 24 hours, you can use MyChart to ask questions about today's visit, request a non-urgent call back, or ask for a work or school excuse from your e-visit provider. You will get an e-mail in the next two days asking about your experience. I hope that your e-visit has been valuable and will speed your recovery.  I have spent 5 minutes in review of e-visit questionnaire, review and updating patient chart, medical decision making and response to patient.   Jawanda Passey W Brelynn Wheller, NP

## 2024-06-19 NOTE — Progress Notes (Signed)
 Work note

## 2024-08-07 ENCOUNTER — Telehealth: Payer: Self-pay | Admitting: Family Medicine

## 2024-08-07 DIAGNOSIS — R112 Nausea with vomiting, unspecified: Secondary | ICD-10-CM

## 2024-08-07 MED ORDER — ONDANSETRON HCL 4 MG PO TABS: 4.0000 mg | ORAL_TABLET | Freq: Three times a day (TID) | ORAL | 0 refills | Status: AC | PRN

## 2024-08-07 NOTE — Progress Notes (Signed)

## 2024-09-14 ENCOUNTER — Telehealth: Admitting: Emergency Medicine

## 2024-09-14 DIAGNOSIS — M545 Low back pain, unspecified: Secondary | ICD-10-CM

## 2024-09-14 MED ORDER — IBUPROFEN 600 MG PO TABS: 600.0000 mg | ORAL_TABLET | Freq: Three times a day (TID) | ORAL | 0 refills | Status: AC | PRN

## 2024-09-14 MED ORDER — CYCLOBENZAPRINE HCL 10 MG PO TABS: 10.0000 mg | ORAL_TABLET | Freq: Three times a day (TID) | ORAL | 0 refills | Status: AC | PRN

## 2024-09-14 NOTE — Progress Notes (Signed)
 We are sorry that you are not feeling well.  Here is how we plan to help!  Based on what you have shared with me it, appears you may be experiencing acute back pain.   Acute back pain is defined as musculoskeletal pain that can resolve in 1-3 weeks with conservative treatment.  I have prescribed a non-steroid anti-inflammatory (NSAID) Ibuprofen  600 mg -- take one tablet by mouth every 8 hours as needed, as well as a muscle relaxant, Flexeril 10 mg every eight hours as needed. Some patients experience stomach irritation or in increased heartburn with anti-inflammatory drugs.  Please keep in mind that muscle relaxer's can cause fatigue and should not be taken while at work or driving.  Back pain is very common.  The pain often gets better over time.  The cause of back pain is usually not dangerous.  Most people can learn to manage their back pain on their own.  Home Care Stay active.  Start with short walks on flat ground if you can.  Try to walk farther each day. Do not sit, drive or stand in one place for more than 30 minutes.  Do not stay in bed. Do not fully avoid exercise or work.  Activity can help your back heal faster. Be careful when you bend or lift an object.  Bend at your knees, keep the object close to you, and do not twist. Sleep on a firm mattress.  Lie on your side, and bend your knees.  If you lie on your back, put a pillow under your knees. Only take medicines as told by your doctor. Put ice on the injured area. Put ice in a plastic bag Place a towel between your skin and the bag Leave the ice on for 15-20 minutes, 3-4 times a day for the first 2-3 days.  After that, you can switch between ice and heat packs. Ask your doctor about back exercises or massage.   Get Help Right Way If: Your pain does not go away with rest and treatments given today. Your pain does not go away within 1 week. You have new problems. You do not feel well. The pain spreads into your legs. You  cannot control when you poop (bowel movement) or pee (urinate). You feel sick to your stomach (nauseous) or throw up (vomit). You have belly (abdominal) pain. You feel like you may pass out (faint). If you develop a fever.  Make Sure you: Understand these instructions. Continue to monitor your condition for any changes. Will get help right away if you are not doing well or get worse.  Your e-visit answers were reviewed by a board certified advanced clinical practitioner to complete your personal care plan.  Depending on the condition, your plan could have included both over the counter or prescription medications.  If there is a problem, please reply once you have received a response from your provider.  Your safety is important to us .  If you have drug allergies, check your prescription carefully.    You can use MyChart to ask questions about todays visit, request a non-urgent call back, or ask for a work or school excuse for 24 hours related to this e-Visit. If it has been greater than 24 hours you will need to follow up with your provider or enter a new e-Visit to address those concerns.  You will get an e-mail in the next two days asking about your experience.  I hope that your e-visit has been valuable and  will speed your recovery. Thank you for using e-visits.  I have spent 5 minutes in review of e-visit questionnaire, review and updating patient chart, medical decision making and response to patient.   Jon Belt, PhD, FNP-BC
